# Patient Record
Sex: Male | Born: 1945 | Race: White | Hispanic: No | Smoking: Former smoker
Health system: Southern US, Community
[De-identification: ages and names within clinical notes are randomized; demographics above are authoritative.]

## PROBLEM LIST (undated history)

## (undated) DIAGNOSIS — I10 Essential (primary) hypertension: Secondary | ICD-10-CM

## (undated) DIAGNOSIS — C801 Malignant (primary) neoplasm, unspecified: Secondary | ICD-10-CM

## (undated) DIAGNOSIS — C4490 Unspecified malignant neoplasm of skin, unspecified: Secondary | ICD-10-CM

## (undated) DIAGNOSIS — M1990 Unspecified osteoarthritis, unspecified site: Secondary | ICD-10-CM

## (undated) DIAGNOSIS — R7303 Prediabetes: Secondary | ICD-10-CM

## (undated) HISTORY — PX: SKIN SURGERY: SHX2413

## (undated) HISTORY — DX: Unspecified malignant neoplasm of skin, unspecified: C44.90

## (undated) HISTORY — PX: HERNIA REPAIR: SHX51

## (undated) HISTORY — PX: LAPAROSCOPIC PARTIAL NEPHRECTOMY: SUR782

## (undated) HISTORY — PX: COLONOSCOPY: SHX174

---

## 2007-05-07 HISTORY — PX: PARTIAL NEPHRECTOMY: SHX414

## 2017-10-09 DIAGNOSIS — Z85828 Personal history of other malignant neoplasm of skin: Secondary | ICD-10-CM | POA: Insufficient documentation

## 2017-10-09 DIAGNOSIS — F419 Anxiety disorder, unspecified: Secondary | ICD-10-CM | POA: Insufficient documentation

## 2017-10-30 DIAGNOSIS — I77819 Aortic ectasia, unspecified site: Secondary | ICD-10-CM | POA: Insufficient documentation

## 2018-03-11 DIAGNOSIS — Z87891 Personal history of nicotine dependence: Secondary | ICD-10-CM | POA: Diagnosis not present

## 2018-03-11 DIAGNOSIS — M25512 Pain in left shoulder: Secondary | ICD-10-CM | POA: Diagnosis not present

## 2018-03-11 DIAGNOSIS — Z299 Encounter for prophylactic measures, unspecified: Secondary | ICD-10-CM | POA: Diagnosis not present

## 2018-03-11 DIAGNOSIS — E7800 Pure hypercholesterolemia, unspecified: Secondary | ICD-10-CM | POA: Diagnosis not present

## 2018-03-11 DIAGNOSIS — M25511 Pain in right shoulder: Secondary | ICD-10-CM | POA: Diagnosis not present

## 2018-03-11 DIAGNOSIS — Z6825 Body mass index (BMI) 25.0-25.9, adult: Secondary | ICD-10-CM | POA: Diagnosis not present

## 2018-03-11 DIAGNOSIS — R21 Rash and other nonspecific skin eruption: Secondary | ICD-10-CM | POA: Diagnosis not present

## 2018-04-03 DIAGNOSIS — M25511 Pain in right shoulder: Secondary | ICD-10-CM | POA: Diagnosis not present

## 2018-04-03 DIAGNOSIS — Z0000 Encounter for general adult medical examination without abnormal findings: Secondary | ICD-10-CM | POA: Diagnosis not present

## 2018-04-03 DIAGNOSIS — R5383 Other fatigue: Secondary | ICD-10-CM | POA: Diagnosis not present

## 2018-04-03 DIAGNOSIS — Z1331 Encounter for screening for depression: Secondary | ICD-10-CM | POA: Diagnosis not present

## 2018-04-03 DIAGNOSIS — E7800 Pure hypercholesterolemia, unspecified: Secondary | ICD-10-CM | POA: Diagnosis not present

## 2018-04-03 DIAGNOSIS — Z1339 Encounter for screening examination for other mental health and behavioral disorders: Secondary | ICD-10-CM | POA: Diagnosis not present

## 2018-04-03 DIAGNOSIS — Z1211 Encounter for screening for malignant neoplasm of colon: Secondary | ICD-10-CM | POA: Diagnosis not present

## 2018-04-03 DIAGNOSIS — Z79899 Other long term (current) drug therapy: Secondary | ICD-10-CM | POA: Diagnosis not present

## 2018-04-03 DIAGNOSIS — M25512 Pain in left shoulder: Secondary | ICD-10-CM | POA: Diagnosis not present

## 2018-04-03 DIAGNOSIS — Z7189 Other specified counseling: Secondary | ICD-10-CM | POA: Diagnosis not present

## 2018-04-03 DIAGNOSIS — Z125 Encounter for screening for malignant neoplasm of prostate: Secondary | ICD-10-CM | POA: Diagnosis not present

## 2018-05-12 DIAGNOSIS — Z299 Encounter for prophylactic measures, unspecified: Secondary | ICD-10-CM | POA: Diagnosis not present

## 2018-05-12 DIAGNOSIS — I10 Essential (primary) hypertension: Secondary | ICD-10-CM | POA: Diagnosis not present

## 2018-05-12 DIAGNOSIS — M19029 Primary osteoarthritis, unspecified elbow: Secondary | ICD-10-CM | POA: Diagnosis not present

## 2018-05-12 DIAGNOSIS — M19021 Primary osteoarthritis, right elbow: Secondary | ICD-10-CM | POA: Diagnosis not present

## 2018-05-12 DIAGNOSIS — Z87891 Personal history of nicotine dependence: Secondary | ICD-10-CM | POA: Diagnosis not present

## 2018-05-12 DIAGNOSIS — Z6825 Body mass index (BMI) 25.0-25.9, adult: Secondary | ICD-10-CM | POA: Diagnosis not present

## 2018-06-12 DIAGNOSIS — M25512 Pain in left shoulder: Secondary | ICD-10-CM | POA: Diagnosis not present

## 2018-06-12 DIAGNOSIS — I10 Essential (primary) hypertension: Secondary | ICD-10-CM | POA: Diagnosis not present

## 2018-06-12 DIAGNOSIS — Z299 Encounter for prophylactic measures, unspecified: Secondary | ICD-10-CM | POA: Diagnosis not present

## 2018-06-12 DIAGNOSIS — M25511 Pain in right shoulder: Secondary | ICD-10-CM | POA: Diagnosis not present

## 2018-06-12 DIAGNOSIS — Z6825 Body mass index (BMI) 25.0-25.9, adult: Secondary | ICD-10-CM | POA: Diagnosis not present

## 2018-06-12 DIAGNOSIS — M1990 Unspecified osteoarthritis, unspecified site: Secondary | ICD-10-CM | POA: Diagnosis not present

## 2018-08-03 DIAGNOSIS — M25511 Pain in right shoulder: Secondary | ICD-10-CM | POA: Diagnosis not present

## 2018-08-03 DIAGNOSIS — M1990 Unspecified osteoarthritis, unspecified site: Secondary | ICD-10-CM | POA: Diagnosis not present

## 2018-08-03 DIAGNOSIS — Z6825 Body mass index (BMI) 25.0-25.9, adult: Secondary | ICD-10-CM | POA: Diagnosis not present

## 2018-08-03 DIAGNOSIS — M25512 Pain in left shoulder: Secondary | ICD-10-CM | POA: Diagnosis not present

## 2018-08-03 DIAGNOSIS — Z299 Encounter for prophylactic measures, unspecified: Secondary | ICD-10-CM | POA: Diagnosis not present

## 2018-08-03 DIAGNOSIS — I10 Essential (primary) hypertension: Secondary | ICD-10-CM | POA: Diagnosis not present

## 2018-09-08 DIAGNOSIS — Z6824 Body mass index (BMI) 24.0-24.9, adult: Secondary | ICD-10-CM | POA: Diagnosis not present

## 2018-09-08 DIAGNOSIS — M2550 Pain in unspecified joint: Secondary | ICD-10-CM | POA: Diagnosis not present

## 2018-10-05 DIAGNOSIS — E7800 Pure hypercholesterolemia, unspecified: Secondary | ICD-10-CM | POA: Diagnosis not present

## 2018-10-05 DIAGNOSIS — I10 Essential (primary) hypertension: Secondary | ICD-10-CM | POA: Diagnosis not present

## 2018-10-05 DIAGNOSIS — N400 Enlarged prostate without lower urinary tract symptoms: Secondary | ICD-10-CM | POA: Diagnosis not present

## 2018-10-05 DIAGNOSIS — Z6824 Body mass index (BMI) 24.0-24.9, adult: Secondary | ICD-10-CM | POA: Diagnosis not present

## 2018-10-05 DIAGNOSIS — M1990 Unspecified osteoarthritis, unspecified site: Secondary | ICD-10-CM | POA: Diagnosis not present

## 2018-10-05 DIAGNOSIS — Z299 Encounter for prophylactic measures, unspecified: Secondary | ICD-10-CM | POA: Diagnosis not present

## 2019-01-05 DIAGNOSIS — Z6824 Body mass index (BMI) 24.0-24.9, adult: Secondary | ICD-10-CM | POA: Diagnosis not present

## 2019-01-05 DIAGNOSIS — M25512 Pain in left shoulder: Secondary | ICD-10-CM | POA: Diagnosis not present

## 2019-01-05 DIAGNOSIS — N400 Enlarged prostate without lower urinary tract symptoms: Secondary | ICD-10-CM | POA: Diagnosis not present

## 2019-01-05 DIAGNOSIS — M1990 Unspecified osteoarthritis, unspecified site: Secondary | ICD-10-CM | POA: Diagnosis not present

## 2019-01-05 DIAGNOSIS — I10 Essential (primary) hypertension: Secondary | ICD-10-CM | POA: Diagnosis not present

## 2019-01-05 DIAGNOSIS — Z299 Encounter for prophylactic measures, unspecified: Secondary | ICD-10-CM | POA: Diagnosis not present

## 2019-02-02 DIAGNOSIS — I10 Essential (primary) hypertension: Secondary | ICD-10-CM | POA: Diagnosis not present

## 2019-02-05 DIAGNOSIS — Z713 Dietary counseling and surveillance: Secondary | ICD-10-CM | POA: Diagnosis not present

## 2019-02-05 DIAGNOSIS — I10 Essential (primary) hypertension: Secondary | ICD-10-CM | POA: Diagnosis not present

## 2019-02-05 DIAGNOSIS — Z23 Encounter for immunization: Secondary | ICD-10-CM | POA: Diagnosis not present

## 2019-02-05 DIAGNOSIS — Z299 Encounter for prophylactic measures, unspecified: Secondary | ICD-10-CM | POA: Diagnosis not present

## 2019-02-23 DIAGNOSIS — Z713 Dietary counseling and surveillance: Secondary | ICD-10-CM | POA: Diagnosis not present

## 2019-02-23 DIAGNOSIS — Z299 Encounter for prophylactic measures, unspecified: Secondary | ICD-10-CM | POA: Diagnosis not present

## 2019-02-23 DIAGNOSIS — I10 Essential (primary) hypertension: Secondary | ICD-10-CM | POA: Diagnosis not present

## 2019-02-23 DIAGNOSIS — Z6825 Body mass index (BMI) 25.0-25.9, adult: Secondary | ICD-10-CM | POA: Diagnosis not present

## 2019-03-03 DIAGNOSIS — I10 Essential (primary) hypertension: Secondary | ICD-10-CM | POA: Diagnosis not present

## 2019-03-11 DIAGNOSIS — M1990 Unspecified osteoarthritis, unspecified site: Secondary | ICD-10-CM | POA: Diagnosis not present

## 2019-03-11 DIAGNOSIS — Z6824 Body mass index (BMI) 24.0-24.9, adult: Secondary | ICD-10-CM | POA: Diagnosis not present

## 2019-03-11 DIAGNOSIS — M25511 Pain in right shoulder: Secondary | ICD-10-CM | POA: Diagnosis not present

## 2019-03-11 DIAGNOSIS — Z299 Encounter for prophylactic measures, unspecified: Secondary | ICD-10-CM | POA: Diagnosis not present

## 2019-03-11 DIAGNOSIS — I10 Essential (primary) hypertension: Secondary | ICD-10-CM | POA: Diagnosis not present

## 2019-03-11 DIAGNOSIS — M25512 Pain in left shoulder: Secondary | ICD-10-CM | POA: Diagnosis not present

## 2019-04-05 DIAGNOSIS — I10 Essential (primary) hypertension: Secondary | ICD-10-CM | POA: Diagnosis not present

## 2019-04-08 DIAGNOSIS — R5383 Other fatigue: Secondary | ICD-10-CM | POA: Diagnosis not present

## 2019-04-08 DIAGNOSIS — Z1339 Encounter for screening examination for other mental health and behavioral disorders: Secondary | ICD-10-CM | POA: Diagnosis not present

## 2019-04-08 DIAGNOSIS — Z7189 Other specified counseling: Secondary | ICD-10-CM | POA: Diagnosis not present

## 2019-04-08 DIAGNOSIS — E7800 Pure hypercholesterolemia, unspecified: Secondary | ICD-10-CM | POA: Diagnosis not present

## 2019-04-08 DIAGNOSIS — M25519 Pain in unspecified shoulder: Secondary | ICD-10-CM | POA: Diagnosis not present

## 2019-04-08 DIAGNOSIS — Z125 Encounter for screening for malignant neoplasm of prostate: Secondary | ICD-10-CM | POA: Diagnosis not present

## 2019-04-08 DIAGNOSIS — Z1211 Encounter for screening for malignant neoplasm of colon: Secondary | ICD-10-CM | POA: Diagnosis not present

## 2019-04-08 DIAGNOSIS — Z1331 Encounter for screening for depression: Secondary | ICD-10-CM | POA: Diagnosis not present

## 2019-04-08 DIAGNOSIS — Z79899 Other long term (current) drug therapy: Secondary | ICD-10-CM | POA: Diagnosis not present

## 2019-04-08 DIAGNOSIS — Z0000 Encounter for general adult medical examination without abnormal findings: Secondary | ICD-10-CM | POA: Diagnosis not present

## 2019-04-21 DIAGNOSIS — M25511 Pain in right shoulder: Secondary | ICD-10-CM | POA: Insufficient documentation

## 2019-04-21 DIAGNOSIS — M5030 Other cervical disc degeneration, unspecified cervical region: Secondary | ICD-10-CM | POA: Diagnosis not present

## 2019-04-21 DIAGNOSIS — M25512 Pain in left shoulder: Secondary | ICD-10-CM | POA: Diagnosis not present

## 2019-04-26 DIAGNOSIS — I10 Essential (primary) hypertension: Secondary | ICD-10-CM | POA: Diagnosis not present

## 2019-05-01 DIAGNOSIS — M25511 Pain in right shoulder: Secondary | ICD-10-CM | POA: Diagnosis not present

## 2019-05-13 DIAGNOSIS — M25511 Pain in right shoulder: Secondary | ICD-10-CM | POA: Diagnosis not present

## 2019-05-13 DIAGNOSIS — M75121 Complete rotator cuff tear or rupture of right shoulder, not specified as traumatic: Secondary | ICD-10-CM | POA: Diagnosis not present

## 2019-06-01 DIAGNOSIS — I10 Essential (primary) hypertension: Secondary | ICD-10-CM | POA: Diagnosis not present

## 2019-06-06 DIAGNOSIS — M1990 Unspecified osteoarthritis, unspecified site: Secondary | ICD-10-CM | POA: Diagnosis not present

## 2019-06-06 DIAGNOSIS — E7800 Pure hypercholesterolemia, unspecified: Secondary | ICD-10-CM | POA: Diagnosis not present

## 2019-06-10 DIAGNOSIS — Z87891 Personal history of nicotine dependence: Secondary | ICD-10-CM | POA: Diagnosis not present

## 2019-06-10 DIAGNOSIS — Z6824 Body mass index (BMI) 24.0-24.9, adult: Secondary | ICD-10-CM | POA: Diagnosis not present

## 2019-06-10 DIAGNOSIS — Z299 Encounter for prophylactic measures, unspecified: Secondary | ICD-10-CM | POA: Diagnosis not present

## 2019-06-10 DIAGNOSIS — I10 Essential (primary) hypertension: Secondary | ICD-10-CM | POA: Diagnosis not present

## 2019-06-10 DIAGNOSIS — M75101 Unspecified rotator cuff tear or rupture of right shoulder, not specified as traumatic: Secondary | ICD-10-CM | POA: Diagnosis not present

## 2019-06-10 DIAGNOSIS — M25519 Pain in unspecified shoulder: Secondary | ICD-10-CM | POA: Diagnosis not present

## 2019-06-23 DIAGNOSIS — M25511 Pain in right shoulder: Secondary | ICD-10-CM | POA: Diagnosis not present

## 2019-07-04 DIAGNOSIS — I10 Essential (primary) hypertension: Secondary | ICD-10-CM | POA: Diagnosis not present

## 2019-07-05 DIAGNOSIS — E7800 Pure hypercholesterolemia, unspecified: Secondary | ICD-10-CM | POA: Insufficient documentation

## 2019-07-05 DIAGNOSIS — M5412 Radiculopathy, cervical region: Secondary | ICD-10-CM | POA: Diagnosis not present

## 2019-07-05 DIAGNOSIS — M542 Cervicalgia: Secondary | ICD-10-CM | POA: Diagnosis not present

## 2019-07-05 DIAGNOSIS — Z6824 Body mass index (BMI) 24.0-24.9, adult: Secondary | ICD-10-CM | POA: Diagnosis not present

## 2019-07-05 DIAGNOSIS — Z299 Encounter for prophylactic measures, unspecified: Secondary | ICD-10-CM | POA: Diagnosis not present

## 2019-07-05 DIAGNOSIS — M5030 Other cervical disc degeneration, unspecified cervical region: Secondary | ICD-10-CM | POA: Insufficient documentation

## 2019-07-05 DIAGNOSIS — I10 Essential (primary) hypertension: Secondary | ICD-10-CM | POA: Insufficient documentation

## 2019-07-05 DIAGNOSIS — M961 Postlaminectomy syndrome, not elsewhere classified: Secondary | ICD-10-CM | POA: Insufficient documentation

## 2019-07-05 DIAGNOSIS — M75101 Unspecified rotator cuff tear or rupture of right shoulder, not specified as traumatic: Secondary | ICD-10-CM | POA: Diagnosis not present

## 2019-07-12 DIAGNOSIS — M5412 Radiculopathy, cervical region: Secondary | ICD-10-CM | POA: Diagnosis not present

## 2019-07-16 DIAGNOSIS — M961 Postlaminectomy syndrome, not elsewhere classified: Secondary | ICD-10-CM | POA: Diagnosis not present

## 2019-07-16 DIAGNOSIS — M5030 Other cervical disc degeneration, unspecified cervical region: Secondary | ICD-10-CM | POA: Diagnosis not present

## 2019-07-28 DIAGNOSIS — M25511 Pain in right shoulder: Secondary | ICD-10-CM | POA: Diagnosis not present

## 2019-07-28 DIAGNOSIS — M19111 Post-traumatic osteoarthritis, right shoulder: Secondary | ICD-10-CM | POA: Diagnosis not present

## 2019-08-03 DIAGNOSIS — I10 Essential (primary) hypertension: Secondary | ICD-10-CM | POA: Diagnosis not present

## 2019-09-02 NOTE — Progress Notes (Signed)
Please place surgery orders. Pt is scheduled for his PAT visit tomorrow.

## 2019-09-02 NOTE — Progress Notes (Addendum)
PCP - [name], MD Cardiologist -   Chest x-ray -  EKG -  Stress Test -  ECHO -  Cardiac Cath -   Sleep Study -  CPAP -   Fasting Blood Sugar -  Checks Blood Sugar _____ times a day  Blood Thinner Instructions: Aspirin Instructions: Last Dose:  Anesthesia review:   Patient denies shortness of breath, fever, cough and chest pain at PAT appointment   Patient verbalized understanding of instructions that were given to them at the PAT appointment. Patient was also instructed that they will need to review over the PAT instructions again at home before surgery.

## 2019-09-02 NOTE — Patient Instructions (Addendum)
DUE TO COVID-19 ONLY ONE VISITOR IS ALLOWED TO COME WITH YOU AND STAY IN THE WAITING ROOM ONLY DURING PRE OP AND PROCEDURE DAY OF SURGERY. THE 1 VISITOR MAY VISIT WITH YOU AFTER SURGERY IN YOUR PRIVATE ROOM DURING VISITING HOURS ONLY!  YOU NEED TO HAVE A COVID 19 TEST ON 09-07-19 @ 2:15 PM, THIS TEST MUST BE DONE BEFORE SURGERY, COME  TO [place], [place] TEST IS COMPLETED, PLEASE BEGIN THE QUARANTINE INSTRUCTIONS AS OUTLINED IN YOUR HANDOUT.                DARROW EGELER  09/02/2019   Your procedure is scheduled on: 09-10-19   Report to [facility] Main  Entrance    Report to Admitting at 7:35 AM     Call this number if you have problems the morning of surgery [phone]    Remember: Do not eat food or drink liquids :After Midnight.     Take these medicines the morning of surgery with A SIP OF WATER: Amlodipine (Norvasc)  BRUSH YOUR TEETH MORNING OF SURGERY AND RINSE YOUR MOUTH OUT, NO CHEWING GUM CANDY OR MINTS.                                 You may not have any metal on your body including hair pins and              piercings     Do not wear jewelry, cologne, lotions, powders or deodorant              Men may shave face and neck.   Do not bring valuables to the hospital. [place].  Contacts, dentures or bridgework may not be worn into surgery.  You may bring an overnight bag     Special Instructions: N/A              Please read over the following fact sheets you were given: _____________________________________________________________________             [facility]- Preparing for Total Shoulder Arthroplasty    Before surgery, you can play an important role. Because skin is not sterile, your skin needs to be as free of germs as possible. You can reduce the number of germs on your skin by using the following products. . Benzoyl Peroxide Gel o Reduces the number of germs  present on the skin o Applied twice a day to shoulder area starting two days before surgery    ==================================================================  Please follow these instructions carefully:  BENZOYL PEROXIDE 5% GEL  Please do not use if you have an allergy to benzoyl peroxide.   If your skin becomes reddened/irritated stop using the benzoyl peroxide.  Starting two days before surgery, apply as follows: 1. Apply benzoyl peroxide in the morning and at night. Apply after taking a shower. If you are not taking a shower clean entire shoulder front, back, and side along with the armpit with a clean wet washcloth.  2. Place a quarter-sized dollop on your shoulder and rub in thoroughly, making sure to cover the front, back, and side of your shoulder, along with the armpit.   2 days before ____ AM   ____ PM  1 day before ____ AM   ____ PM                         3. Do this twice a day for two days.  (Last application is the night before surgery, AFTER using the CHG soap as described below).  4. Do NOT apply benzoyl peroxide gel on the day of surgery. [facility] - Preparing for Surgery Before surgery, you can play an important role.  Because skin is not sterile, your skin needs to be as free of germs as possible.  You can reduce the number of germs on your skin by washing with CHG (chlorahexidine gluconate) soap before surgery.  CHG is an antiseptic cleaner which kills germs and bonds with the skin to continue killing germs even after washing. Please DO NOT use if you have an allergy to CHG or antibacterial soaps.  If your skin becomes reddened/irritated stop using the CHG and inform your nurse when you arrive at Short Stay. Do not shave (including legs and underarms) for at least 48 hours prior to the first CHG shower.  You may shave your face/neck. Please follow these instructions carefully:  1.  Shower with CHG Soap the night before surgery and the  morning of  Surgery.  2.  If you choose to wash your hair, wash your hair first as usual with your  normal  shampoo.  3.  After you shampoo, rinse your hair and body thoroughly to remove the  shampoo.                           4.  Use CHG as you would any other liquid soap.  You can apply chg directly  to the skin and wash                       Gently with a scrungie or clean washcloth.  5.  Apply the CHG Soap to your body ONLY FROM THE NECK DOWN.   Do not use on face/ open                           Wound or open sores. Avoid contact with eyes, ears mouth and genitals (private parts).                       Wash face,  Genitals (private parts) with your normal soap.             6.  Wash thoroughly, paying special attention to the area where your surgery  will be performed.  7.  Thoroughly rinse your body with warm water from the neck down.  8.  DO NOT shower/wash with your normal soap after using and rinsing off  the CHG Soap.                9.  Pat yourself dry with a clean towel.            10.  Wear clean pajamas.            11.  Place clean sheets on your bed the night of your first shower and do not  sleep with pets. Day of Surgery : Do not apply any lotions/deodorants the morning of surgery.  Please wear clean clothes to the hospital/surgery center.  FAILURE TO FOLLOW THESE INSTRUCTIONS MAY RESULT  IN THE CANCELLATION OF YOUR SURGERY PATIENT SIGNATURE_________________________________  NURSE SIGNATURE__________________________________  ________________________________________________________________________

## 2019-09-03 ENCOUNTER — Encounter (HOSPITAL_COMMUNITY)
Admission: RE | Admit: 2019-09-03 | Discharge: 2019-09-03 | Disposition: A | Discharge status: Home / Self Care | Payer: Medicare HMO | Source: Ambulatory Visit | Attending: Orthopedic Surgery | Admitting: Orthopedic Surgery

## 2019-09-03 ENCOUNTER — Encounter (HOSPITAL_COMMUNITY): Payer: Self-pay

## 2019-09-03 ENCOUNTER — Other Ambulatory Visit: Payer: Self-pay

## 2019-09-03 DIAGNOSIS — R001 Bradycardia, unspecified: Secondary | ICD-10-CM | POA: Insufficient documentation

## 2019-09-03 DIAGNOSIS — I10 Essential (primary) hypertension: Secondary | ICD-10-CM | POA: Insufficient documentation

## 2019-09-03 DIAGNOSIS — Z01818 Encounter for other preprocedural examination: Secondary | ICD-10-CM | POA: Insufficient documentation

## 2019-09-03 HISTORY — DX: Essential (primary) hypertension: I10

## 2019-09-03 HISTORY — DX: Prediabetes: R73.03

## 2019-09-03 HISTORY — DX: Malignant (primary) neoplasm, unspecified: C80.1

## 2019-09-03 HISTORY — DX: Unspecified osteoarthritis, unspecified site: M19.90

## 2019-09-03 LAB — BASIC METABOLIC PANEL
Anion gap: 7 (ref 5–15)
BUN: 26 mg/dL — ABNORMAL HIGH (ref 8–23)
CO2: 26 mmol/L (ref 22–32)
Calcium: 9.2 mg/dL (ref 8.9–10.3)
Chloride: 106 mmol/L (ref 98–111)
Creatinine, Ser: 1.16 mg/dL (ref 0.61–1.24)
GFR calc Af Amer: >60 mL/min (ref >60)
GFR calc non Af Amer: >60 mL/min (ref >60)
Glucose, Bld: 124 mg/dL — ABNORMAL HIGH (ref 70–99)
Potassium: 4.3 mmol/L (ref 3.5–5.1)
Sodium: 139 mmol/L (ref 135–145)

## 2019-09-03 LAB — HEMOGLOBIN A1C
Hgb A1c MFr Bld: 5.6 % (ref 4.8–5.6)
Mean Plasma Glucose: 114.02 mg/dL

## 2019-09-03 LAB — CBC
HCT: 42.9 % (ref 39.0–52.0)
Hemoglobin: 14.1 g/dL (ref 13.0–17.0)
MCH: 32.5 pg (ref 26.0–34.0)
MCHC: 32.9 g/dL (ref 30.0–36.0)
MCV: 98.8 fL (ref 80.0–100.0)
Platelets: 246 10*3/uL (ref 150–400)
RBC: 4.34 MIL/uL (ref 4.22–5.81)
RDW: 12.5 % (ref 11.5–15.5)
WBC: 8.2 10*3/uL (ref 4.0–10.5)
nRBC: 0 % (ref 0.0–0.2)

## 2019-09-03 LAB — SURGICAL PCR SCREEN
MRSA, PCR: NEGATIVE
Staphylococcus aureus: POSITIVE — AB

## 2019-09-03 NOTE — Progress Notes (Addendum)
PCR result route to Dr. Veverly Fells for review

## 2019-09-07 ENCOUNTER — Other Ambulatory Visit (HOSPITAL_COMMUNITY)
Admission: RE | Admit: 2019-09-07 | Discharge: 2019-09-07 | Disposition: A | Discharge status: Home / Self Care | Payer: Medicare HMO | Source: Ambulatory Visit | Attending: Orthopedic Surgery | Admitting: Orthopedic Surgery

## 2019-09-07 ENCOUNTER — Other Ambulatory Visit: Payer: Self-pay

## 2019-09-07 DIAGNOSIS — Z87891 Personal history of nicotine dependence: Secondary | ICD-10-CM | POA: Diagnosis not present

## 2019-09-07 DIAGNOSIS — Z96611 Presence of right artificial shoulder joint: Secondary | ICD-10-CM | POA: Diagnosis not present

## 2019-09-07 DIAGNOSIS — Z471 Aftercare following joint replacement surgery: Secondary | ICD-10-CM | POA: Diagnosis not present

## 2019-09-07 DIAGNOSIS — G8918 Other acute postprocedural pain: Secondary | ICD-10-CM | POA: Diagnosis not present

## 2019-09-07 DIAGNOSIS — Z85828 Personal history of other malignant neoplasm of skin: Secondary | ICD-10-CM | POA: Diagnosis not present

## 2019-09-07 DIAGNOSIS — Z79899 Other long term (current) drug therapy: Secondary | ICD-10-CM | POA: Diagnosis not present

## 2019-09-07 DIAGNOSIS — M67813 Other specified disorders of tendon, right shoulder: Secondary | ICD-10-CM | POA: Diagnosis not present

## 2019-09-07 DIAGNOSIS — Z20822 Contact with and (suspected) exposure to covid-19: Secondary | ICD-10-CM | POA: Diagnosis present

## 2019-09-07 DIAGNOSIS — I10 Essential (primary) hypertension: Secondary | ICD-10-CM | POA: Diagnosis present

## 2019-09-07 DIAGNOSIS — Z85528 Personal history of other malignant neoplasm of kidney: Secondary | ICD-10-CM | POA: Diagnosis not present

## 2019-09-07 DIAGNOSIS — M75101 Unspecified rotator cuff tear or rupture of right shoulder, not specified as traumatic: Secondary | ICD-10-CM | POA: Diagnosis present

## 2019-09-07 DIAGNOSIS — M19011 Primary osteoarthritis, right shoulder: Secondary | ICD-10-CM | POA: Diagnosis present

## 2019-09-07 DIAGNOSIS — Z905 Acquired absence of kidney: Secondary | ICD-10-CM | POA: Diagnosis not present

## 2019-09-07 DIAGNOSIS — M12811 Other specific arthropathies, not elsewhere classified, right shoulder: Secondary | ICD-10-CM | POA: Diagnosis not present

## 2019-09-07 NOTE — H&P (Signed)
Patient's anticipated LOS is less than 2 midnights, meeting these requirements: - Younger than 82 - Lives within 1 hour of care - Has a competent adult at home to recover with post-op recover - NO history of  - Chronic pain requiring opiods  - Diabetes  - Coronary Artery Disease  - Heart failure  - Heart attack  - Stroke  - DVT/VTE  - Cardiac arrhythmia  - Respiratory Failure/COPD  - Renal failure  - Anemia  - Advanced Liver disease       Douglas [name] is an 74 y.o. male.    Chief Complaint: right shoulder pain  HPI: Pt is a 74 y.o. male complaining of right shoulder  pain for multiple years. Pain had continually increased since the beginning. X-rays in the clinic show end-stage arthritic changes of the right shoulder. Pt has tried various conservative treatments which have failed to alleviate their symptoms, including injections and therapy. Various options are discussed with the patient. Risks, benefits and expectations were discussed with the patient. Patient understand the risks, benefits and expectations and wishes to proceed with surgery.   PCP:  [name], MD  D/C Plans: Home  PMH: Past Medical History:  Diagnosis Date  . Arthritis   . Cancer (HCC)    Skin, and Kidney  . Hypertension   . Pre-diabetes     PSH: Past Surgical History:  Procedure Laterality Date  . COLONOSCOPY    . HERNIA REPAIR    . PARTIAL NEPHRECTOMY Left 2009    Social History:  reports that he quit smoking about 11 years ago. His smoking use included cigarettes. He has a 20.00 pack-year smoking history. He has never used smokeless tobacco. He reports current alcohol use of about 1.0 standard drinks of alcohol per week. He reports that he does not use drugs.  Allergies:  No Known Allergies  Medications: No current facility-administered medications for this encounter.   Current Outpatient Medications  Medication Sig Dispense Refill  . amLODipine (NORVASC) 10 MG tablet Take 10 mg  by mouth daily.    Marland Kitchen atenolol (TENORMIN) 25 MG tablet Take 25 mg by mouth daily.    . cholecalciferol (VITAMIN D3) 25 MCG (1000 UNIT) tablet Take 1,000 Units by mouth daily.    . diclofenac (VOLTAREN) 75 MG EC tablet Take 75 mg by mouth daily.    Marland Kitchen lisinopril (ZESTRIL) 40 MG tablet Take 40 mg by mouth daily.    . Menaquinone-7 (VITAMIN K2 PO) Take 1 tablet by mouth daily.    . Multiple Vitamins-Minerals (MULTIVITAMIN WITH MINERALS) tablet Take 1 tablet by mouth daily.    . simvastatin (ZOCOR) 20 MG tablet Take 20 mg by mouth daily.    . tamsulosin (FLOMAX) 0.4 MG CAPS capsule Take 0.8 mg by mouth daily.      No results found for this or any previous visit (from the past 48 hour(s)). No results found.  ROS: Pain with rom of the right upper extremity  Physical Exam: Alert and oriented 74 y.o. male in no acute distress Cranial nerves 2-12 intact Cervical spine: full rom with no tenderness, nv intact distally Chest: active breath sounds bilaterally, no wheeze rhonchi or rales Heart: regular rate and rhythm, no murmur Abd: non tender non distended with active bowel sounds Hip is stable with rom  Right shoulder painful rom nv intact distally No rashes or edema  Assessment/Plan Assessment: right shoulder pain  Plan:  Patient will undergo a revere total shoulder by Dr. Veverly Fells at  [facility] Risks benefits and expectations were discussed with the patient. Patient understand risks, benefits and expectations and wishes to proceed. Preoperative templating of the joint replacement has been completed, documented, and submitted to the Operating Room personnel in order to optimize intra-operative equipment management.   Merla Riches PA-C, [facility] is now [employer] [address], [place], [place], [place] [address] Phone: [phone] [email or web address] Facebook  [employer]

## 2019-09-08 LAB — SARS CORONAVIRUS 2 (TAT 6-24 HRS): SARS Coronavirus 2: NEGATIVE

## 2019-09-10 ENCOUNTER — Encounter (HOSPITAL_COMMUNITY)
Admission: RE | Disposition: A | Discharge status: Home / Self Care | Payer: Self-pay | Source: Ambulatory Visit | Attending: Orthopedic Surgery

## 2019-09-10 ENCOUNTER — Inpatient Hospital Stay (HOSPITAL_COMMUNITY)
Admission: RE | Admit: 2019-09-10 | Discharge: 2019-09-11 | DRG: 483 | Disposition: A | Discharge status: Home / Self Care | Payer: Medicare HMO | Attending: Orthopedic Surgery | Admitting: Orthopedic Surgery

## 2019-09-10 ENCOUNTER — Inpatient Hospital Stay (HOSPITAL_COMMUNITY): Payer: Medicare HMO

## 2019-09-10 ENCOUNTER — Other Ambulatory Visit: Payer: Self-pay

## 2019-09-10 ENCOUNTER — Inpatient Hospital Stay (HOSPITAL_COMMUNITY): Payer: Medicare HMO | Admitting: Anesthesiology

## 2019-09-10 ENCOUNTER — Encounter (HOSPITAL_COMMUNITY): Payer: Self-pay | Admitting: Orthopedic Surgery

## 2019-09-10 DIAGNOSIS — M75101 Unspecified rotator cuff tear or rupture of right shoulder, not specified as traumatic: Secondary | ICD-10-CM | POA: Diagnosis present

## 2019-09-10 DIAGNOSIS — Z87891 Personal history of nicotine dependence: Secondary | ICD-10-CM | POA: Diagnosis not present

## 2019-09-10 DIAGNOSIS — I10 Essential (primary) hypertension: Secondary | ICD-10-CM | POA: Diagnosis present

## 2019-09-10 DIAGNOSIS — Z905 Acquired absence of kidney: Secondary | ICD-10-CM | POA: Diagnosis not present

## 2019-09-10 DIAGNOSIS — Z85828 Personal history of other malignant neoplasm of skin: Secondary | ICD-10-CM

## 2019-09-10 DIAGNOSIS — Z85528 Personal history of other malignant neoplasm of kidney: Secondary | ICD-10-CM

## 2019-09-10 DIAGNOSIS — Z20822 Contact with and (suspected) exposure to covid-19: Secondary | ICD-10-CM | POA: Diagnosis present

## 2019-09-10 DIAGNOSIS — M19011 Primary osteoarthritis, right shoulder: Secondary | ICD-10-CM | POA: Diagnosis present

## 2019-09-10 DIAGNOSIS — Z79899 Other long term (current) drug therapy: Secondary | ICD-10-CM | POA: Diagnosis not present

## 2019-09-10 DIAGNOSIS — Z9660 Presence of unspecified orthopedic joint implant: Secondary | ICD-10-CM

## 2019-09-10 DIAGNOSIS — Z96611 Presence of right artificial shoulder joint: Secondary | ICD-10-CM

## 2019-09-10 HISTORY — PX: REVERSE SHOULDER ARTHROPLASTY: SHX5054

## 2019-09-10 SURGERY — ARTHROPLASTY, SHOULDER, TOTAL, REVERSE
Anesthesia: General | Site: Shoulder | Laterality: Right

## 2019-09-10 MED ORDER — MEPERIDINE HCL 50 MG/ML IJ SOLN: 6.2500 mg | INTRAMUSCULAR | Status: DC | PRN

## 2019-09-10 MED ORDER — OXYCODONE HCL 5 MG/5ML PO SOLN: 5.0000 mg | Freq: Once | ORAL | Status: DC | PRN

## 2019-09-10 MED ORDER — ACETAMINOPHEN 160 MG/5ML PO SOLN: 325.0000 mg | ORAL | Status: DC | PRN

## 2019-09-10 MED ORDER — DEXAMETHASONE SODIUM PHOSPHATE 10 MG/ML IJ SOLN
INTRAMUSCULAR | Status: AC
  Filled 2019-09-10: qty 1

## 2019-09-10 MED ORDER — ONDANSETRON HCL 4 MG/2ML IJ SOLN
INTRAMUSCULAR | Status: AC
  Filled 2019-09-10: qty 2

## 2019-09-10 MED ORDER — ROCURONIUM BROMIDE 10 MG/ML (PF) SYRINGE
PREFILLED_SYRINGE | INTRAVENOUS | Status: DC | PRN
  Administered 2019-09-10: 15 mg via INTRAVENOUS

## 2019-09-10 MED ORDER — MIDAZOLAM HCL 2 MG/2ML IJ SOLN
1.0000 mg | INTRAMUSCULAR | Status: DC
  Administered 2019-09-10: 2 mg via INTRAVENOUS
  Filled 2019-09-10: qty 2

## 2019-09-10 MED ORDER — HYDROCODONE-ACETAMINOPHEN 5-325 MG PO TABS
1.0000 | ORAL_TABLET | ORAL | Status: DC | PRN
  Administered 2019-09-11: 1 via ORAL
  Filled 2019-09-10: qty 1

## 2019-09-10 MED ORDER — 0.9 % SODIUM CHLORIDE (POUR BTL) OPTIME
TOPICAL | Status: DC | PRN
  Administered 2019-09-10: 1000 mL

## 2019-09-10 MED ORDER — LIDOCAINE 2% (20 MG/ML) 5 ML SYRINGE
INTRAMUSCULAR | Status: DC | PRN
  Administered 2019-09-10: 50 mg via INTRAVENOUS

## 2019-09-10 MED ORDER — MIDAZOLAM HCL 5 MG/5ML IJ SOLN
INTRAMUSCULAR | Status: DC | PRN
  Administered 2019-09-10: 2 mg via INTRAVENOUS

## 2019-09-10 MED ORDER — BISACODYL 10 MG RE SUPP: 10.0000 mg | Freq: Every day | RECTAL | Status: DC | PRN

## 2019-09-10 MED ORDER — MENTHOL 3 MG MT LOZG: 1.0000 | LOZENGE | OROMUCOSAL | Status: DC | PRN

## 2019-09-10 MED ORDER — SODIUM CHLORIDE 0.9 % IV SOLN: INTRAVENOUS | Status: DC

## 2019-09-10 MED ORDER — MULTI-VITAMIN/MINERALS PO TABS: 1.0000 | ORAL_TABLET | Freq: Every day | ORAL | Status: DC

## 2019-09-10 MED ORDER — ONDANSETRON HCL 4 MG/2ML IJ SOLN: 4.0000 mg | Freq: Four times a day (QID) | INTRAMUSCULAR | Status: DC | PRN

## 2019-09-10 MED ORDER — METOCLOPRAMIDE HCL 5 MG/ML IJ SOLN: 5.0000 mg | Freq: Three times a day (TID) | INTRAMUSCULAR | Status: DC | PRN

## 2019-09-10 MED ORDER — METHOCARBAMOL 500 MG PO TABS: 500.0000 mg | ORAL_TABLET | Freq: Three times a day (TID) | ORAL | 1 refills | Status: DC | PRN

## 2019-09-10 MED ORDER — METOCLOPRAMIDE HCL 5 MG PO TABS: 5.0000 mg | ORAL_TABLET | Freq: Three times a day (TID) | ORAL | Status: DC | PRN

## 2019-09-10 MED ORDER — ONDANSETRON HCL 4 MG/2ML IJ SOLN: 4.0000 mg | Freq: Once | INTRAMUSCULAR | Status: DC | PRN

## 2019-09-10 MED ORDER — LIDOCAINE 2% (20 MG/ML) 5 ML SYRINGE
INTRAMUSCULAR | Status: AC
  Filled 2019-09-10: qty 5

## 2019-09-10 MED ORDER — METHOCARBAMOL 500 MG IVPB - SIMPLE MED
500.0000 mg | Freq: Four times a day (QID) | INTRAVENOUS | Status: DC | PRN
  Filled 2019-09-10: qty 50

## 2019-09-10 MED ORDER — FENTANYL CITRATE (PF) 250 MCG/5ML IJ SOLN
INTRAMUSCULAR | Status: AC
  Filled 2019-09-10: qty 5

## 2019-09-10 MED ORDER — BUPIVACAINE-EPINEPHRINE 0.5% -1:200000 IJ SOLN
INTRAMUSCULAR | Status: AC
  Filled 2019-09-10: qty 1

## 2019-09-10 MED ORDER — LACTATED RINGERS IV SOLN: INTRAVENOUS | Status: DC

## 2019-09-10 MED ORDER — SIMVASTATIN 20 MG PO TABS
20.0000 mg | ORAL_TABLET | Freq: Every day | ORAL | Status: DC
  Administered 2019-09-10: 20 mg via ORAL
  Filled 2019-09-10 (×2): qty 1

## 2019-09-10 MED ORDER — VITAMIN D 25 MCG (1000 UNIT) PO TABS
1000.0000 [IU] | ORAL_TABLET | Freq: Every day | ORAL | Status: DC
  Administered 2019-09-10 – 2019-09-11 (×2): 1000 [IU] via ORAL
  Filled 2019-09-10 (×2): qty 1

## 2019-09-10 MED ORDER — BUPIVACAINE-EPINEPHRINE (PF) 0.5% -1:200000 IJ SOLN
INTRAMUSCULAR | Status: DC | PRN
Start: 2019-09-10 — End: 2019-09-10
  Administered 2019-09-10: 15 mL via PERINEURAL

## 2019-09-10 MED ORDER — ACETAMINOPHEN 325 MG PO TABS: 325.0000 mg | ORAL_TABLET | Freq: Four times a day (QID) | ORAL | Status: DC | PRN

## 2019-09-10 MED ORDER — SUCCINYLCHOLINE CHLORIDE 200 MG/10ML IV SOSY
PREFILLED_SYRINGE | INTRAVENOUS | Status: AC
  Filled 2019-09-10: qty 10

## 2019-09-10 MED ORDER — HYDROCODONE-ACETAMINOPHEN 5-325 MG PO TABS: 1.0000 | ORAL_TABLET | Freq: Four times a day (QID) | ORAL | 0 refills | Status: DC | PRN

## 2019-09-10 MED ORDER — OXYCODONE HCL 5 MG PO TABS: 5.0000 mg | ORAL_TABLET | Freq: Once | ORAL | Status: DC | PRN

## 2019-09-10 MED ORDER — PROPOFOL 10 MG/ML IV BOLUS
INTRAVENOUS | Status: DC | PRN
  Administered 2019-09-10: 150 mg via INTRAVENOUS

## 2019-09-10 MED ORDER — SUCCINYLCHOLINE CHLORIDE 200 MG/10ML IV SOSY
PREFILLED_SYRINGE | INTRAVENOUS | Status: DC | PRN
  Administered 2019-09-10: 140 mg via INTRAVENOUS

## 2019-09-10 MED ORDER — AMLODIPINE BESYLATE 10 MG PO TABS
10.0000 mg | ORAL_TABLET | Freq: Every day | ORAL | Status: DC
  Administered 2019-09-11: 09:00:00 10 mg via ORAL
  Filled 2019-09-10: qty 1

## 2019-09-10 MED ORDER — EPHEDRINE 5 MG/ML INJ
INTRAVENOUS | Status: AC
  Filled 2019-09-10: qty 10

## 2019-09-10 MED ORDER — EPHEDRINE SULFATE 50 MG/ML IJ SOLN
INTRAMUSCULAR | Status: DC | PRN
Start: 2019-09-10 — End: 2019-09-10
  Administered 2019-09-10: 10 mg via INTRAVENOUS

## 2019-09-10 MED ORDER — CEFAZOLIN SODIUM-DEXTROSE 2-4 GM/100ML-% IV SOLN
2.0000 g | Freq: Four times a day (QID) | INTRAVENOUS | Status: DC
  Administered 2019-09-10 – 2019-09-11 (×2): 2 g via INTRAVENOUS
  Filled 2019-09-10 (×3): qty 100

## 2019-09-10 MED ORDER — FENTANYL CITRATE (PF) 100 MCG/2ML IJ SOLN
INTRAMUSCULAR | Status: DC | PRN
  Administered 2019-09-10: 100 ug via INTRAVENOUS

## 2019-09-10 MED ORDER — PHENYLEPHRINE HCL (PRESSORS) 10 MG/ML IV SOLN
INTRAVENOUS | Status: AC
  Filled 2019-09-10: qty 1

## 2019-09-10 MED ORDER — BUPIVACAINE LIPOSOME 1.3 % IJ SUSP
INTRAMUSCULAR | Status: DC | PRN
Start: 2019-09-10 — End: 2019-09-10
  Administered 2019-09-10: 10 mL via PERINEURAL

## 2019-09-10 MED ORDER — ROCURONIUM BROMIDE 10 MG/ML (PF) SYRINGE
PREFILLED_SYRINGE | INTRAVENOUS | Status: AC
  Filled 2019-09-10: qty 10

## 2019-09-10 MED ORDER — CEFAZOLIN SODIUM-DEXTROSE 2-4 GM/100ML-% IV SOLN
2.0000 g | INTRAVENOUS | Status: AC
  Administered 2019-09-10: 2 g via INTRAVENOUS
  Filled 2019-09-10: qty 100

## 2019-09-10 MED ORDER — PHENOL 1.4 % MT LIQD: 1.0000 | OROMUCOSAL | Status: DC | PRN

## 2019-09-10 MED ORDER — STERILE WATER FOR IRRIGATION IR SOLN
Status: DC | PRN
  Administered 2019-09-10: 2000 mL

## 2019-09-10 MED ORDER — POLYETHYLENE GLYCOL 3350 17 G PO PACK: 17.0000 g | PACK | Freq: Every day | ORAL | Status: DC | PRN

## 2019-09-10 MED ORDER — ONDANSETRON HCL 4 MG/2ML IJ SOLN
INTRAMUSCULAR | Status: DC | PRN
  Administered 2019-09-10: 4 mg via INTRAVENOUS

## 2019-09-10 MED ORDER — PHENYLEPHRINE HCL-NACL 10-0.9 MG/250ML-% IV SOLN
INTRAVENOUS | Status: DC | PRN
  Administered 2019-09-10: 150 ug/min via INTRAVENOUS

## 2019-09-10 MED ORDER — DICLOFENAC SODIUM 75 MG PO TBEC
75.0000 mg | DELAYED_RELEASE_TABLET | Freq: Every day | ORAL | Status: DC
  Administered 2019-09-10 – 2019-09-11 (×2): 75 mg via ORAL
  Filled 2019-09-10 (×2): qty 1

## 2019-09-10 MED ORDER — MORPHINE SULFATE (PF) 2 MG/ML IV SOLN: 0.5000 mg | INTRAVENOUS | Status: DC | PRN

## 2019-09-10 MED ORDER — ATENOLOL 25 MG PO TABS
25.0000 mg | ORAL_TABLET | Freq: Every day | ORAL | Status: DC
  Administered 2019-09-10 – 2019-09-11 (×2): 25 mg via ORAL
  Filled 2019-09-10 (×2): qty 1

## 2019-09-10 MED ORDER — PROPOFOL 10 MG/ML IV BOLUS
INTRAVENOUS | Status: AC
  Filled 2019-09-10: qty 20

## 2019-09-10 MED ORDER — ADULT MULTIVITAMIN W/MINERALS CH
1.0000 | ORAL_TABLET | Freq: Every day | ORAL | Status: DC
  Administered 2019-09-10 – 2019-09-11 (×2): 1 via ORAL
  Filled 2019-09-10 (×2): qty 1

## 2019-09-10 MED ORDER — TAMSULOSIN HCL 0.4 MG PO CAPS
0.8000 mg | ORAL_CAPSULE | Freq: Every day | ORAL | Status: DC
  Administered 2019-09-10 – 2019-09-11 (×2): 0.8 mg via ORAL
  Filled 2019-09-10 (×2): qty 2

## 2019-09-10 MED ORDER — DOCUSATE SODIUM 100 MG PO CAPS
100.0000 mg | ORAL_CAPSULE | Freq: Two times a day (BID) | ORAL | Status: DC
  Administered 2019-09-10 – 2019-09-11 (×2): 100 mg via ORAL
  Filled 2019-09-10 (×2): qty 1

## 2019-09-10 MED ORDER — ONDANSETRON HCL 4 MG PO TABS: 4.0000 mg | ORAL_TABLET | Freq: Four times a day (QID) | ORAL | Status: DC | PRN

## 2019-09-10 MED ORDER — SUGAMMADEX SODIUM 200 MG/2ML IV SOLN
INTRAVENOUS | Status: DC | PRN
  Administered 2019-09-10: 150 mg via INTRAVENOUS

## 2019-09-10 MED ORDER — BUPIVACAINE-EPINEPHRINE (PF) 0.5% -1:200000 IJ SOLN
INTRAMUSCULAR | Status: DC | PRN
  Administered 2019-09-10: 7 mL

## 2019-09-10 MED ORDER — METHOCARBAMOL 500 MG PO TABS
500.0000 mg | ORAL_TABLET | Freq: Four times a day (QID) | ORAL | Status: DC | PRN
  Administered 2019-09-10 – 2019-09-11 (×2): 500 mg via ORAL
  Filled 2019-09-10 (×2): qty 1

## 2019-09-10 MED ORDER — MIDAZOLAM HCL 2 MG/2ML IJ SOLN
INTRAMUSCULAR | Status: AC
  Filled 2019-09-10: qty 2

## 2019-09-10 MED ORDER — ACETAMINOPHEN 325 MG PO TABS: 325.0000 mg | ORAL_TABLET | ORAL | Status: DC | PRN

## 2019-09-10 MED ORDER — FENTANYL CITRATE (PF) 100 MCG/2ML IJ SOLN
50.0000 ug | INTRAMUSCULAR | Status: DC
  Administered 2019-09-10: 12:00:00 100 ug via INTRAVENOUS
  Filled 2019-09-10: qty 2

## 2019-09-10 MED ORDER — SODIUM CHLORIDE 0.9 % IV SOLN: INTRAVENOUS | Status: DC | PRN

## 2019-09-10 MED ORDER — LISINOPRIL 20 MG PO TABS
40.0000 mg | ORAL_TABLET | Freq: Every day | ORAL | Status: DC
  Administered 2019-09-10 – 2019-09-11 (×2): 40 mg via ORAL
  Filled 2019-09-10 (×2): qty 2

## 2019-09-10 MED ORDER — DEXAMETHASONE SODIUM PHOSPHATE 10 MG/ML IJ SOLN
INTRAMUSCULAR | Status: DC | PRN
  Administered 2019-09-10: 7 mg via INTRAVENOUS

## 2019-09-10 MED ORDER — ALBUMIN HUMAN 5 % IV SOLN
INTRAVENOUS | Status: AC
  Filled 2019-09-10: qty 250

## 2019-09-10 MED ORDER — ALBUMIN HUMAN 5 % IV SOLN
INTRAVENOUS | Status: DC | PRN
Start: 2019-09-10 — End: 2019-09-10

## 2019-09-10 MED ORDER — FENTANYL CITRATE (PF) 100 MCG/2ML IJ SOLN: 25.0000 ug | INTRAMUSCULAR | Status: DC | PRN

## 2019-09-10 SURGICAL SUPPLY — 72 items
BAG ZIPLOCK 12X15 (MISCELLANEOUS) ×2 IMPLANT
BIT DRILL 1.6MX128 (BIT) IMPLANT
BIT DRILL 1.6MX128MM (BIT)
BIT DRILL 170X2.5X (BIT) IMPLANT
BIT DRL 170X2.5X (BIT) ×1
BLADE SAG 18X100X1.27 (BLADE) ×3 IMPLANT
CLOSURE WOUND 1/2 X4 (GAUZE/BANDAGES/DRESSINGS) ×1
COVER BACK TABLE 60X90IN (DRAPES) ×3 IMPLANT
COVER SURGICAL LIGHT HANDLE (MISCELLANEOUS) ×3 IMPLANT
COVER WAND RF STERILE (DRAPES) IMPLANT
CUP HUMERAL 42 PLUS 3 (Orthopedic Implant) ×2 IMPLANT
DECANTER SPIKE VIAL GLASS SM (MISCELLANEOUS) ×3 IMPLANT
DRAPE INCISE IOBAN 66X45 STRL (DRAPES) ×3 IMPLANT
DRAPE ORTHO SPLIT 77X108 STRL (DRAPES) ×4
DRAPE SHEET LG 3/4 BI-LAMINATE (DRAPES) ×3 IMPLANT
DRAPE SURG ORHT 6 SPLT 77X108 (DRAPES) ×2 IMPLANT
DRAPE U-SHAPE 47X51 STRL (DRAPES) ×3 IMPLANT
DRILL 2.5 (BIT) ×2
DRSG ADAPTIC 3X8 NADH LF (GAUZE/BANDAGES/DRESSINGS) ×3 IMPLANT
DRSG PAD ABDOMINAL 8X10 ST (GAUZE/BANDAGES/DRESSINGS) ×3 IMPLANT
DURAPREP 26ML APPLICATOR (WOUND CARE) ×3 IMPLANT
ELECT BLADE TIP CTD 4 INCH (ELECTRODE) ×3 IMPLANT
ELECT NDL TIP 2.8 STRL (NEEDLE) ×1 IMPLANT
ELECT NEEDLE TIP 2.8 STRL (NEEDLE) ×3 IMPLANT
ELECT REM PT RETURN 15FT ADLT (MISCELLANEOUS) ×3 IMPLANT
EPIPHYSI RIGHT SZ 2 (Shoulder) ×3 IMPLANT
EPIPHYSIS RIGHT SZ 2 (Shoulder) IMPLANT
GAUZE SPONGE 4X4 12PLY STRL (GAUZE/BANDAGES/DRESSINGS) ×3 IMPLANT
GLENOSPHERE XTEND RSA 42 SD +2 (Joint) ×2 IMPLANT
GLOVE BIOGEL PI ORTHO PRO 7.5 (GLOVE) ×2
GLOVE BIOGEL PI ORTHO PRO SZ8 (GLOVE) ×2
GLOVE ORTHO TXT STRL SZ7.5 (GLOVE) ×3 IMPLANT
GLOVE PI ORTHO PRO STRL 7.5 (GLOVE) ×1 IMPLANT
GLOVE PI ORTHO PRO STRL SZ8 (GLOVE) ×1 IMPLANT
GLOVE SURG ORTHO 8.5 STRL (GLOVE) ×3 IMPLANT
GOWN STRL REUS W/TWL XL LVL3 (GOWN DISPOSABLE) ×6 IMPLANT
KIT BASIN (CUSTOM PROCEDURE TRAY) ×3 IMPLANT
KIT TURNOVER KIT A (KITS) IMPLANT
MANIFOLD NEPTUNE II (INSTRUMENTS) ×3 IMPLANT
METAGLENE DELTA EXTEND (Trauma) IMPLANT
METAGLENE DXTEND (Trauma) ×3 IMPLANT
NDL MAYO CATGUT SZ4 TPR NDL (NEEDLE) IMPLANT
NEEDLE MAYO CATGUT SZ4 (NEEDLE) IMPLANT
NS IRRIG 1000ML POUR BTL (IV SOLUTION) ×3 IMPLANT
PACK SHOULDER (CUSTOM PROCEDURE TRAY) ×3 IMPLANT
PENCIL SMOKE EVACUATOR (MISCELLANEOUS) IMPLANT
PIN GUIDE 1.2 (PIN) ×2 IMPLANT
PIN GUIDE GLENOPHERE 1.5MX300M (PIN) ×2 IMPLANT
PIN METAGLENE 2.5 (PIN) ×2 IMPLANT
PROTECTOR NERVE ULNAR (MISCELLANEOUS) ×3 IMPLANT
RESTRAINT HEAD UNIVERSAL NS (MISCELLANEOUS) ×3 IMPLANT
SCREW 4.5X18MM (Screw) ×2 IMPLANT
SCREW 4.5X36MM (Screw) ×2 IMPLANT
SCREW BN 18X4.5XSTRL SHLDR (Screw) IMPLANT
SCREW LOCK 42 (Screw) ×2 IMPLANT
SLING ARM FOAM STRAP LRG (SOFTGOODS) ×2 IMPLANT
SMARTMIX MINI TOWER (MISCELLANEOUS)
SPONGE LAP 4X18 RFD (DISPOSABLE) IMPLANT
STEM DELTA DIA 10 HA (Stem) ×2 IMPLANT
STRIP CLOSURE SKIN 1/2X4 (GAUZE/BANDAGES/DRESSINGS) ×2 IMPLANT
SUCTION FRAZIER HANDLE 10FR (MISCELLANEOUS) ×2
SUCTION TUBE FRAZIER 10FR DISP (MISCELLANEOUS) ×1 IMPLANT
SUT FIBERWIRE #2 38 T-5 BLUE (SUTURE) ×6
SUT MNCRL AB 4-0 PS2 18 (SUTURE) ×3 IMPLANT
SUT VIC AB 0 CT1 36 (SUTURE) ×6 IMPLANT
SUT VIC AB 0 CT2 27 (SUTURE) ×3 IMPLANT
SUT VIC AB 2-0 CT1 27 (SUTURE) ×2
SUT VIC AB 2-0 CT1 TAPERPNT 27 (SUTURE) ×1 IMPLANT
SUTURE FIBERWR #2 38 T-5 BLUE (SUTURE) ×2 IMPLANT
TOWEL OR 17X26 10 PK STRL BLUE (TOWEL DISPOSABLE) ×3 IMPLANT
TOWER SMARTMIX MINI (MISCELLANEOUS) IMPLANT
YANKAUER SUCT BULB TIP 10FT TU (MISCELLANEOUS) ×3 IMPLANT

## 2019-09-10 NOTE — Anesthesia Preprocedure Evaluation (Addendum)
Anesthesia Evaluation  Patient identified by MRN, date of birth, ID band Patient awake    Reviewed: Allergy & Precautions, H&P , NPO status , Patient's Chart, lab work & pertinent test results, reviewed documented beta blocker date and time   Airway Mallampati: II  TM Distance: >3 FB Neck ROM: Limited    Dental no notable dental hx. (+) Teeth Intact, Dental Advisory Given, Caps   Pulmonary neg pulmonary ROS, former smoker,    Pulmonary exam normal breath sounds clear to auscultation       Cardiovascular Exercise Tolerance: Good hypertension, Pt. on medications and Pt. on home beta blockers negative cardio ROS   Rhythm:regular Rate:Normal     Neuro/Psych negative neurological ROS  negative psych ROS   GI/Hepatic negative GI ROS, Neg liver ROS,   Endo/Other  negative endocrine ROS  Renal/GU negative Renal ROS  negative genitourinary   Musculoskeletal  (+) Arthritis , Osteoarthritis,    Abdominal   Peds  Hematology negative hematology ROS (+)   Anesthesia Other Findings   Reproductive/Obstetrics negative OB ROS                           Anesthesia Physical Anesthesia Plan  ASA: II  Anesthesia Plan: General   Post-op Pain Management: GA combined w/ Regional for post-op pain   Induction:   PONV Risk Score and Plan:   Airway Management Planned: LMA and Oral ETT  Additional Equipment:   Intra-op Plan:   Post-operative Plan:   Informed Consent: I have reviewed the patients History and Physical, chart, labs and discussed the procedure including the risks, benefits and alternatives for the proposed anesthesia with the patient or authorized representative who has indicated his/her understanding and acceptance.     Dental Advisory Given  Plan Discussed with: CRNA, Anesthesiologist and Surgeon  Anesthesia Plan Comments:         Anesthesia Quick Evaluation

## 2019-09-10 NOTE — Progress Notes (Signed)
Assisted Dr. Oddono with right, ultrasound guided, interscalene  block. Side rails up, monitors on throughout procedure. See vital signs in flow sheet. Tolerated Procedure well. 

## 2019-09-10 NOTE — Brief Op Note (Signed)
09/10/2019  3:08 PM  PATIENT:  Douglas [name]  74 y.o. male  PRE-OPERATIVE DIAGNOSIS:  Right shoulder cuff arthropathy  POST-OPERATIVE DIAGNOSIS:  Right shoulder cuff arthropathy  PROCEDURE:  Procedure(s) with comments: REVERSE SHOULDER ARTHROPLASTY (Right) - interscalene block DePuy Delta Xtend, no subscap repair  SURGEON:  Surgeon(s) and Role:    [name], MD - Primary  PHYSICIAN ASSISTANT:   ASSISTANTS: Ventura Bruns, PA-C   ANESTHESIA:   regional and general  EBL:  Less than 100 cc  BLOOD ADMINISTERED:none  DRAINS: none   LOCAL MEDICATIONS USED:  MARCAINE     SPECIMEN:  No Specimen  DISPOSITION OF SPECIMEN:  N/A  COUNTS:  YES  TOURNIQUET:  * No tourniquets in log *  DICTATION: .Other Dictation: Dictation Number [id number]  PLAN OF CARE: Admit to inpatient   PATIENT DISPOSITION:  PACU - hemodynamically stable.   Delay start of Pharmacological VTE agent (>24hrs) due to surgical blood loss or risk of bleeding: not applicable

## 2019-09-10 NOTE — Anesthesia Procedure Notes (Addendum)
Anesthesia Regional Block: Interscalene brachial plexus block   Pre-Anesthetic Checklist: ,, timeout performed, Correct Patient, Correct Site, Correct Laterality, Correct Procedure, Correct Position, site marked, Risks and benefits discussed,  Surgical consent,  Pre-op evaluation,  At surgeon's request and post-op pain management  Laterality: Right  Prep: chloraprep       Needles:  Injection technique: Single-shot  Needle Type: Echogenic Stimulator Needle     Needle Length: 5cm  Needle Gauge: 22     Additional Needles:   Procedures:, nerve stimulator,,, ultrasound used (permanent image in chart),,,,   Nerve Stimulator or Paresthesia:  Response: quadraceps contraction, 0.45 mA,   Additional Responses:   Narrative:  Start time: 09/10/2019 12:23 PM End time: 09/10/2019 12:27 PM Injection made incrementally with aspirations every 5 mL.  Performed by: Personally  Anesthesiologist: Janeece Riggers, MD  Additional Notes: Functioning IV was confirmed and monitors were applied.  A 79mm 22ga Arrow echogenic stimulator needle was used. Sterile prep and drape,hand hygiene and sterile gloves were used. Ultrasound guidance: relevant anatomy identified, needle position confirmed, local anesthetic spread visualized around nerve(s)., vascular puncture avoided.  Image printed for medical record. Negative aspiration and negative test dose prior to incremental administration of local anesthetic. The patient tolerated the procedure well.

## 2019-09-10 NOTE — Transfer of Care (Signed)
Immediate Anesthesia Transfer of Care Note  Patient: Douglas [name]  Procedure(s) Performed: REVERSE SHOULDER ARTHROPLASTY (Right Shoulder)  Patient Location: PACU  Anesthesia Type:General  Level of Consciousness: awake, alert , oriented and patient cooperative  Airway & Oxygen Therapy: Patient Spontanous Breathing and Patient connected to face mask oxygen  Post-op Assessment: Report given to RN, Post -op Vital signs reviewed and stable and Patient moving all extremities X 4  Post vital signs: stable  Last Vitals:  Vitals Value Taken Time  BP 130/66 09/10/19 1515  Temp    Pulse 56 09/10/19 1519  Resp 13 09/10/19 1519  SpO2 100 % 09/10/19 1519  Vitals shown include unvalidated device data.  Last Pain:  Vitals:   09/10/19 1306  TempSrc:   PainSc: 0-No pain      Patients Stated Pain Goal: 4 ([id number])  Complications: No apparent anesthesia complications

## 2019-09-10 NOTE — Anesthesia Procedure Notes (Signed)
Procedure Name: Intubation Date/Time: 09/10/2019 1:35 PM Performed by: Lissa Morales, CRNA Pre-anesthesia Checklist: Patient identified, Emergency Drugs available, Suction available and Patient being monitored Patient Re-evaluated:Patient Re-evaluated prior to induction Oxygen Delivery Method: Circle system utilized Preoxygenation: Pre-oxygenation with 100% oxygen Induction Type: IV induction Ventilation: Mask ventilation without difficulty Laryngoscope Size: Mac and 4 Tube type: Oral Tube size: 7.5 mm Number of attempts: 1 Airway Equipment and Method: Stylet and Oral airway Placement Confirmation: ETT inserted through vocal cords under direct vision,  positive ETCO2 and breath sounds checked- equal and bilateral Secured at: 21 cm Tube secured with: Tape Dental Injury: Teeth and Oropharynx as per pre-operative assessment

## 2019-09-10 NOTE — Interval H&P Note (Signed)
History and Physical Interval Note:  09/10/2019 12:10 PM  Douglas [name]  has presented today for surgery, with the diagnosis of Right shoulder cuff arthropathy.  The various methods of treatment have been discussed with the patient and family. After consideration of risks, benefits and other options for treatment, the patient has consented to  Procedure(s) with comments: REVERSE SHOULDER ARTHROPLASTY (Right) - interscalene block as a surgical intervention.  The patient's history has been reviewed, patient examined, no change in status, stable for surgery.  I have reviewed the patient's chart and labs.  Questions were answered to the patient's satisfaction.     Augustin Schooling

## 2019-09-10 NOTE — Anesthesia Postprocedure Evaluation (Signed)
Anesthesia Post Note  Patient: Douglas [name]  Procedure(s) Performed: REVERSE SHOULDER ARTHROPLASTY (Right Shoulder)     Patient location during evaluation: PACU Anesthesia Type: General Level of consciousness: awake and alert Pain management: pain level controlled Vital Signs Assessment: post-procedure vital signs reviewed and stable Respiratory status: spontaneous breathing, nonlabored ventilation, respiratory function stable and patient connected to nasal cannula oxygen Cardiovascular status: stable and blood pressure returned to baseline Postop Assessment: no apparent nausea or vomiting Anesthetic complications: no    Last Vitals:  Vitals:   09/10/19 1515 09/10/19 1530  BP: 130/66 (!) 117/57  Pulse: 61 (!) 53  Resp: 17 13  Temp:  (!) 36.2 C  SpO2: 100% 99%    Last Pain:  Vitals:   09/10/19 1511  TempSrc:   PainSc: 0-No pain                 Rozena Fierro

## 2019-09-10 NOTE — Discharge Instructions (Signed)
Ice to the shoulder constantly.  Keep the incision covered and clean and dry for one week, then ok to get it wet in the shower. ° °Do exercise as instructed several times per day. ° °DO NOT reach behind your back or push up out of a chair with the operative arm. ° °Use a sling while you are up and around for comfort, may remove while seated.  Keep pillow propped behind the operative elbow. ° °Follow up with Dr Francesa Eugenio in two weeks in the office, call [phone] for appt °

## 2019-09-11 LAB — BASIC METABOLIC PANEL
Anion gap: 7 (ref 5–15)
BUN: 24 mg/dL — ABNORMAL HIGH (ref 8–23)
CO2: 24 mmol/L (ref 22–32)
Calcium: 8.3 mg/dL — ABNORMAL LOW (ref 8.9–10.3)
Chloride: 105 mmol/L (ref 98–111)
Creatinine, Ser: 1.06 mg/dL (ref 0.61–1.24)
GFR calc Af Amer: >60 mL/min (ref >60)
GFR calc non Af Amer: >60 mL/min (ref >60)
Glucose, Bld: 109 mg/dL — ABNORMAL HIGH (ref 70–99)
Potassium: 4.4 mmol/L (ref 3.5–5.1)
Sodium: 136 mmol/L (ref 135–145)

## 2019-09-11 LAB — HEMOGLOBIN AND HEMATOCRIT, BLOOD
HCT: 35 % — ABNORMAL LOW (ref 39.0–52.0)
Hemoglobin: 11.4 g/dL — ABNORMAL LOW (ref 13.0–17.0)

## 2019-09-11 NOTE — Progress Notes (Signed)
Orthopedics Progress Note  Subjective: Patient comfortable this AM. No complaints  Objective:  Vitals:   09/11/19 0144 09/11/19 0544  BP: (!) 109/50 (!) 115/54  Pulse: (!) 51 (!) 54  Resp: 18 18  Temp: 98.2 F (36.8 C) 97.7 F (36.5 C)  SpO2: 97% 99%    General: Awake and alert  Musculoskeletal: right shoulder dressing changed. Wound looks good. No erythema or drainage Neurovascularly intact  Lab Results  Component Value Date   WBC 8.2 09/03/2019   HGB 11.4 (L) 09/11/2019   HCT 35.0 (L) 09/11/2019   MCV 98.8 09/03/2019   PLT 246 09/03/2019       Component Value Date/Time   NA 136 09/11/2019 0346   K 4.4 09/11/2019 0346   CL 105 09/11/2019 0346   CO2 24 09/11/2019 0346   GLUCOSE 109 (H) 09/11/2019 0346   BUN 24 (H) 09/11/2019 0346   CREATININE 1.06 09/11/2019 0346   CALCIUM 8.3 (L) 09/11/2019 0346   GFRNONAA >60 09/11/2019 0346   GFRAA >60 09/11/2019 0346    No results found for: INR, PROTIME  Assessment/Plan: POD #1 s/p Procedure(s): REVERSE SHOULDER ARTHROPLASTY Discharge to home after therapy today. Do home exercies as instructed Follow up in two weeks with Dr Esmond Plants R. Veverly Fells, MD 09/11/2019 7:28 AM

## 2019-09-11 NOTE — Discharge Summary (Signed)
Orthopedic Discharge Summary        Physician Discharge Summary  Patient ID: Douglas [name] MRN: [id number] DOB/AGE: 1945-08-14 74 y.o.  Admit date: 09/10/2019 Discharge date: 09/11/2019   Procedures:  Procedure(s) (LRB): REVERSE SHOULDER ARTHROPLASTY (Right)  Attending Physician:  Dr. Esmond Plants  Admission Diagnoses:   Right shoulder OA, RC insufficiency  Discharge Diagnoses:  same   Past Medical History:  Diagnosis Date  . Arthritis   . Cancer (HCC)    Skin, and Kidney  . Hypertension   . Pre-diabetes     PCP: [name], MD   Discharged Condition: good  Hospital Course:  Patient underwent the above stated procedure on 09/10/2019. Patient tolerated the procedure well and brought to the recovery room in good condition and subsequently to the floor. Patient had an uncomplicated hospital course and was stable for discharge.   Disposition: Discharge disposition: 01-Home or Self Care      with follow up in 2 weeks   Follow-up Information    [name], MD. Call in 2 weeks.   Specialty: Orthopedic Surgery Why: [phone] Contact information: [address] [place] [address] [id number]           Discharge Instructions    Call MD / Call 911   Complete by: As directed    If you experience chest pain or shortness of breath, CALL 911 and be transported to the hospital emergency room.  If you develope a fever above 101 F, pus (white drainage) or increased drainage or redness at the wound, or calf pain, call your surgeon's office.   Constipation Prevention   Complete by: As directed    Drink plenty of fluids.  Prune juice may be helpful.  You may use a stool softener, such as Colace (over the counter) 100 mg twice a day.  Use MiraLax (over the counter) for constipation as needed.   Diet - low sodium heart healthy   Complete by: As directed    Driving restrictions   Complete by: As directed    No driving for 2 weeks   Increase  activity slowly as tolerated   Complete by: As directed       Allergies as of 09/11/2019   No Known Allergies     Medication List    TAKE these medications   amLODipine 10 MG tablet Commonly known as: NORVASC Take 10 mg by mouth daily.   atenolol 25 MG tablet Commonly known as: TENORMIN Take 25 mg by mouth daily.   cholecalciferol 25 MCG (1000 UNIT) tablet Commonly known as: VITAMIN D3 Take 1,000 Units by mouth daily.   diclofenac 75 MG EC tablet Commonly known as: VOLTAREN Take 75 mg by mouth daily.   HYDROcodone-acetaminophen 5-325 MG tablet Commonly known as: Norco Take 1-2 tablets by mouth every 6 (six) hours as needed for moderate pain or severe pain.   lisinopril 40 MG tablet Commonly known as: ZESTRIL Take 40 mg by mouth daily.   methocarbamol 500 MG tablet Commonly known as: Robaxin Take 1 tablet (500 mg total) by mouth every 8 (eight) hours as needed.   multivitamin with minerals tablet Take 1 tablet by mouth daily.   simvastatin 20 MG tablet Commonly known as: ZOCOR Take 20 mg by mouth daily.   tamsulosin 0.4 MG Caps capsule Commonly known as: FLOMAX Take 0.8 mg by mouth daily.   VITAMIN K2 PO Take 1 tablet by mouth daily.  Signed: Augustin Schooling 09/11/2019, 7:31 AM  [facility] is now [facility] [address] [place], [place], [place] [address] Phone: [place]

## 2019-09-11 NOTE — Plan of Care (Signed)
  Problem: Education: Goal: Knowledge of the prescribed therapeutic regimen will improve Outcome: Progressing   Problem: Pain Management: Goal: Pain level will decrease with appropriate interventions Outcome: Progressing   

## 2019-09-11 NOTE — Plan of Care (Signed)
Patient discharged home in stable condition. Waiting on his ride

## 2019-09-11 NOTE — Evaluation (Signed)
Occupational Therapy Evaluation Patient Details Name: Douglas [name] MRN: [id number] DOB: 1945-12-17 Today's Date: 09/11/2019    History of Present Illness Patient is a 74 year old man s/p right shoulder replacement   Clinical Impression   Patient presents s/p total shoulder replacement with shoulder precautions and ROM limitations. Therapist provided education and instruction to patient in regards to exercises, precautions, ROM guidelines, positioning, donning upper extremity clothing and bathing while maintaining shoulder precautions, ice and edema management and donning/doffing sling. Patient verbalized understanding and demonstrated as needed. Patient needed assistance to donn shirt and demonstrated ability to donn lower body clothing with verbal cues to not use right hand. Patient to follow up with MD for further therapy needs.      Follow Up Recommendations  Follow surgeon's recommendation for DC plan and follow-up therapies    Equipment Recommendations  None recommended by OT    Recommendations for Other Services       Precautions / Restrictions Precautions Precautions: Shoulder Shoulder Interventions: Shoulder sling/immobilizer;Off for dressing/bathing/exercises;For comfort(for sleep) Precaution Comments: AROM elbow, wrist and hand okay, AROM/PROM Forward Flexion 0-90 deg, AROM/PROM Abduction 0-60 deg, AROM/PRM External Rotation 0-30 deg Required Braces or Orthoses: Sling Restrictions Weight Bearing Restrictions: Yes RUE Weight Bearing: Non weight bearing      Mobility Bed Mobility               General bed mobility comments: Patient supervision to transfer to side of the bed on the left.  Transfers Overall transfer level: Independent               General transfer comment: Supervision for safety to ambulate in room. No unsafe movement or loss of balance.    Balance                                           ADL either performed or  assessed with clinical judgement   ADL Overall ADL's : Needs assistance/impaired Eating/Feeding: Set up   Grooming: Independent;Wash/dry face;Wash/dry hands;Oral care   Upper Body Bathing: Set up   Lower Body Bathing: Set up   Upper Body Dressing : Moderate assistance Upper Body Dressing Details (indicate cue type and reason): Assistance to donn jacket during instruction/education on shoulder precautions. Lower Body Dressing: Set up   Toilet Transfer: Supervision/safety   Toileting- Clothing Manipulation and Hygiene: Supervision/safety         General ADL Comments: verbal cues for shoulder precautions needed. Instruction/education on how to perform ADL tasks secondary to shoulder precautions.     Vision         Perception     Praxis      Pertinent Vitals/Pain Pain Assessment: Faces Faces Pain Scale: Hurts a little bit Pain Descriptors / Indicators: Aching;Grimacing Pain Intervention(s): Limited activity within patient's tolerance     Hand Dominance     Extremity/Trunk Assessment Upper Extremity Assessment Upper Extremity Assessment: RUE deficits/detail RUE Deficits / Details: Decreased ROM/strength, shoulder precautions.       Cervical / Trunk Assessment Cervical / Trunk Assessment: Normal   Communication Communication Communication: No difficulties   Cognition Arousal/Alertness: Awake/alert Behavior During Therapy: WFL for tasks assessed/performed Overall Cognitive Status: Within Functional Limits for tasks assessed  General Comments       Exercises     Shoulder Instructions Shoulder Instructions Donning/doffing shirt without moving shoulder: Moderate assistance Method for sponge bathing under operated UE: Patient able to independently direct caregiver Donning/doffing sling/immobilizer: Independent;Patient able to independently direct caregiver Correct positioning of sling/immobilizer:  Independent;Patient able to independently direct caregiver ROM for elbow, wrist and digits of operated UE: Independent;Patient able to independently direct caregiver Sling wearing schedule (on at all times/off for ADL's): Independent;Patient able to independently direct caregiver Proper positioning of operated UE when showering: Independent;Patient able to independently direct caregiver Positioning of UE while sleeping: Patient able to independently direct caregiver    Home Living Family/patient expects to be discharged to:: Private residence Living Arrangements: Children Available Help at Discharge: Available PRN/intermittently Type of Home: House Home Access: Stairs to enter     Home Layout: One level     Bathroom Shower/Tub: Teacher, early years/pre: Standard Bathroom Accessibility: Yes   Home Equipment: None          Prior Functioning/Environment Level of Independence: Independent        Comments: works at [employer].        OT Problem List: Decreased strength;Decreased range of motion;Impaired UE functional use;Pain      OT Treatment/Interventions:      OT Goals(Current goals can be found in the care plan section)    OT Frequency:     Barriers to D/C:            Co-evaluation              AM-PAC OT "6 Clicks" Daily Activity     Outcome Measure Help from another person eating meals?: A Little Help from another person taking care of personal grooming?: None Help from another person toileting, which includes using toliet, bedpan, or urinal?: A Little Help from another person bathing (including washing, rinsing, drying)?: A Little Help from another person to put on and taking off regular upper body clothing?: A Lot Help from another person to put on and taking off regular lower body clothing?: A Little 6 Click Score: 18   End of Session Nurse Communication: (okay to DC)  Activity Tolerance: Patient tolerated treatment well Patient left:  in chair  OT Visit Diagnosis: Muscle weakness (generalized) (M62.81);Pain                Time: [id number] OT Time Calculation (min): 38 min Charges:  OT General Charges $OT Visit: 1 Visit OT Evaluation $OT Eval Moderate Complexity: 1 Mod OT Treatments $Self Care/Home Management : 23-37 mins  Derl Barrow, OTR/L Acute Care Rehab Services  Office [phone]   Lenward Chancellor 09/11/2019, 10:49 AM

## 2019-09-11 NOTE — Op Note (Signed)
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  PROCEDURE PERFORMED:  Right reverse total shoulder replacement using DePuy Delta Xtend prosthesis with no subscapularis repair.  ATTENDING SURGEON:  Esmond Plants, MD  ASSISTANT:  Darol Destine, [place], who was scrubbed during the entire procedure and necessary for satisfactory completion of surgery.   ANESTHESIA:  General anesthesia was used plus interscalene block.  ESTIMATED BLOOD LOSS:  Minimal.  FLUID REPLACEMENT:  1200 mL crystalloid.  INSTRUMENT COUNTS:  Correct.  COMPLICATIONS:  There were no complications.  ANTIBIOTICS:  Perioperative antibiotics were given.  INDICATIONS:  The patient is a 74 year old male with a history of worsening right shoulder pain and dysfunction secondary to full full-thickness and retracted tear of the supra and infraspinatus tendons.  The patient's teres minor was intact and  subscapularis looks also be intact, but somewhat atrophied.  Risks and benefits of surgical management discussed in detail with the patient.  He elected to move forward with reverse shoulder replacement to restore fixed focal mechanics and to decrease  pain in the shoulder.  Informed consent obtained.  DESCRIPTION OF PROCEDURE:  After an adequate level of anesthesia was achieved, the patient was positioned in modified beach chair position.  Right shoulder correctly identified and sterilely prepped and draped in the usual manner.  Time-out called,  verifying correct patient, correct site, we entered the patient's shoulder using a deltopectoral incision,  starting at the coracoid process extending down to the anterior humerus dissection down through subcutaneous tissues using needle tip Bovie.  We  identified the cephalic vein, took that laterally with the deltoid pectoralis taken medially.  Conjoined tendon identified and retracted medially.  We placed our deep retractors, identified the biceps tendon whipstitched with 0 Vicryl suture  figure-of-eight 2 to secure the biceps tendon within the biceps sheath and incorporating part of the pectoralis tendon insertion.  At this point, we released the subscapularis remnant off the lesser tuberosity, it was extremely thin maybe 1-2 mm of  thickness throughout the bottom two-thirds of the tendon and the upper part was a little thicker, but was fairly well scarred up to the undersurface of the coracoid process.  The decision was made due to how thin the subscap was intact and likely chronic  lack of excursion that we would not repair it.  We just tagged it for retraction of the axillary nerve and protection of the axillary nerve.  We then released the inferior scapular inferior capsule off the neck progressively externally rotating.  We  then extended the shoulder and delivered the humeral head out of the wound, placing a brown retractor for good visualization.  We placed a Crego elevator medially.  We entered the proximal humerus with a 6 mm reamer, reaming up to a size 10 mm.  We then  placed our 10 mm intramedullary resection guide and resected the head with an oscillating saw at 10 degrees of retroversion.  Once we had the head resected and removed excess osteophytes with a rongeur.  Next, we subluxed the humerus posteriorly provided  excellent visualization of the glenoid and we did a 360-degree glenoid labrum and capsule removal.  We were able to visualize the glenoid face and determine the 6 o'clock inferior  scapular neck position.  We placed our guide pin for our reaming for  which did and we reamed and  prepared the glenoid for the metaglene baseplate.  We then drilled out the central peg hole impacted the metaglene into position, placed a 42 screw inferiorly, a 36 at the base of coracoid and an 18 nonlocked posteriorly, the  other two superior and inferior screws were locked.  With the baseplate secured, we selected a 42+2 glenosphere, we had not medialize the patient much, but we felt like for this individual.  We want to lateralize just a no-touch for stability and also to  load the external rotators and hopefully improve function.  We selected a 42 for stability.  At this point, we irrigated thoroughly with the glenosphere in place and secured the baseplate, utilizing the screwdriver and the impactor and the screwdriver  again.  We did a finger sweep to make sure there was no soft tissue caught up in that bearing.  We then went to the humeral side.  Again, completed our humeral preparation with the 2 right metaphyseal reamer.  Once we had that reamed, we impacted the 10  stem 2 right metaphysis set on the 0 setting and placed in 10 degrees of retroversion and impacted that we trialled with a 42+3 poly trial, had a nice stable shoulder a little pop as it reduced and good tension with the arm at the patient's side.  No gap  with inferior pole or external rotation.  At this point, we removed the trial components from the humeral side, irrigated thoroughly used an impaction grafting technique with available bone graft from the humeral head and impacted the HA coated  press-fit stem size 10 and the 2 right metaphysis set on the 0 setting and impacted in 10 degrees of retroversion.  We selected the real 42+3 poly, impacted that and humeral tray and reduced the shoulder.  We had a nice stable shoulder throughout a full  arc of motion.  We irrigated thoroughly and then closed in layers after resecting the subscap remnant.  We closed with 0 Vicryl for the deltopectoral interval followed by 2-0 Vicryl for  subcutaneous closure and 4-0 Monocryl for skin.  Steri-Strips  applied followed by sterile dressing.  The patient tolerated surgery well.  PN/NUANCE  D:09/10/2019 T:09/11/2019 JOB:[id number]

## 2019-09-13 ENCOUNTER — Encounter: Payer: Self-pay | Admitting: *Deleted

## 2019-09-14 ENCOUNTER — Other Ambulatory Visit: Payer: Self-pay | Admitting: *Deleted

## 2019-09-14 NOTE — Patient Outreach (Signed)
[facility]) Care Management  09/14/2019  Douglas [name] [birth year] [id number]   Red on EMMI Alert   Day:1 Date:09/13/19 Red Alert Reason : Scheduled Follow up?- "No" per patient response.    Outreach Attempt #1 Subjective: Unsuccessful outreach call to patient , no answer able to leave a HIPAA compliant message for return call.   [place] call from [facility], explained reason for the call and [facility] care management services.  Addressed reason for EMMI red alert, patient states that he scheduled follow up visit with Dr. Veverly Fells appointment is on 5/20.  Patient states that he is getting along pretty good after right shoulder surgery. He reports having family support at this time to assist in recovery  after discharge and he reports managing with  ADL's. He states that his son will be able to drive him to appointment.  He denies any other concerns at this time.   Plan Will close case to [facility] care management no further needs care management needs to be addressed.    Joylene Draft, RN, BSN  [place] Management Coordinator  [phone]- Mobile [phone]- Toll Free Main Office

## 2019-09-22 DIAGNOSIS — Z4789 Encounter for other orthopedic aftercare: Secondary | ICD-10-CM | POA: Insufficient documentation

## 2019-09-23 DIAGNOSIS — Z4789 Encounter for other orthopedic aftercare: Secondary | ICD-10-CM | POA: Diagnosis not present

## 2019-10-03 DIAGNOSIS — I10 Essential (primary) hypertension: Secondary | ICD-10-CM | POA: Diagnosis not present

## 2019-10-04 DIAGNOSIS — I10 Essential (primary) hypertension: Secondary | ICD-10-CM | POA: Diagnosis not present

## 2019-10-04 DIAGNOSIS — E785 Hyperlipidemia, unspecified: Secondary | ICD-10-CM | POA: Diagnosis not present

## 2019-10-04 DIAGNOSIS — M1990 Unspecified osteoarthritis, unspecified site: Secondary | ICD-10-CM | POA: Diagnosis not present

## 2019-10-19 DIAGNOSIS — I8390 Asymptomatic varicose veins of unspecified lower extremity: Secondary | ICD-10-CM | POA: Diagnosis not present

## 2019-10-19 DIAGNOSIS — Z299 Encounter for prophylactic measures, unspecified: Secondary | ICD-10-CM | POA: Diagnosis not present

## 2019-10-19 DIAGNOSIS — M25519 Pain in unspecified shoulder: Secondary | ICD-10-CM | POA: Diagnosis not present

## 2019-10-19 DIAGNOSIS — I10 Essential (primary) hypertension: Secondary | ICD-10-CM | POA: Diagnosis not present

## 2019-11-03 DIAGNOSIS — I10 Essential (primary) hypertension: Secondary | ICD-10-CM | POA: Diagnosis not present

## 2019-11-16 DIAGNOSIS — Z4789 Encounter for other orthopedic aftercare: Secondary | ICD-10-CM | POA: Diagnosis not present

## 2019-11-17 ENCOUNTER — Other Ambulatory Visit: Payer: Self-pay | Admitting: Physician Assistant

## 2019-11-19 ENCOUNTER — Other Ambulatory Visit: Payer: Self-pay | Admitting: Physician Assistant

## 2019-11-22 ENCOUNTER — Other Ambulatory Visit: Payer: Self-pay

## 2019-11-22 ENCOUNTER — Ambulatory Visit
Admission: RE | Admit: 2019-11-22 | Discharge: 2019-11-22 | Disposition: A | Discharge status: Home / Self Care | Payer: Medicare HMO | Source: Ambulatory Visit | Attending: Physician Assistant | Admitting: Physician Assistant

## 2019-11-22 ENCOUNTER — Other Ambulatory Visit: Payer: Self-pay | Admitting: Physician Assistant

## 2019-11-22 DIAGNOSIS — I7090 Unspecified atherosclerosis: Secondary | ICD-10-CM | POA: Diagnosis not present

## 2019-11-22 DIAGNOSIS — Z4789 Encounter for other orthopedic aftercare: Secondary | ICD-10-CM

## 2019-11-22 DIAGNOSIS — M25511 Pain in right shoulder: Secondary | ICD-10-CM | POA: Diagnosis not present

## 2019-12-03 DIAGNOSIS — E7849 Other hyperlipidemia: Secondary | ICD-10-CM | POA: Diagnosis not present

## 2019-12-03 DIAGNOSIS — M1990 Unspecified osteoarthritis, unspecified site: Secondary | ICD-10-CM | POA: Diagnosis not present

## 2019-12-03 DIAGNOSIS — I10 Essential (primary) hypertension: Secondary | ICD-10-CM | POA: Diagnosis not present

## 2019-12-14 DIAGNOSIS — S42121G Displaced fracture of acromial process, right shoulder, subsequent encounter for fracture with delayed healing: Secondary | ICD-10-CM | POA: Diagnosis not present

## 2019-12-14 DIAGNOSIS — M25511 Pain in right shoulder: Secondary | ICD-10-CM | POA: Diagnosis not present

## 2019-12-27 DIAGNOSIS — G8918 Other acute postprocedural pain: Secondary | ICD-10-CM | POA: Diagnosis not present

## 2019-12-27 DIAGNOSIS — S42121A Displaced fracture of acromial process, right shoulder, initial encounter for closed fracture: Secondary | ICD-10-CM | POA: Diagnosis not present

## 2020-01-04 DIAGNOSIS — I10 Essential (primary) hypertension: Secondary | ICD-10-CM | POA: Diagnosis not present

## 2020-01-11 DIAGNOSIS — Z4789 Encounter for other orthopedic aftercare: Secondary | ICD-10-CM | POA: Diagnosis not present

## 2020-02-03 DIAGNOSIS — E7849 Other hyperlipidemia: Secondary | ICD-10-CM | POA: Diagnosis not present

## 2020-02-03 DIAGNOSIS — M1990 Unspecified osteoarthritis, unspecified site: Secondary | ICD-10-CM | POA: Diagnosis not present

## 2020-02-03 DIAGNOSIS — I10 Essential (primary) hypertension: Secondary | ICD-10-CM | POA: Diagnosis not present

## 2020-02-08 DIAGNOSIS — Z4789 Encounter for other orthopedic aftercare: Secondary | ICD-10-CM | POA: Diagnosis not present

## 2020-02-08 DIAGNOSIS — M25512 Pain in left shoulder: Secondary | ICD-10-CM | POA: Diagnosis not present

## 2020-03-04 DIAGNOSIS — I10 Essential (primary) hypertension: Secondary | ICD-10-CM | POA: Diagnosis not present

## 2020-03-07 DIAGNOSIS — Z4789 Encounter for other orthopedic aftercare: Secondary | ICD-10-CM | POA: Diagnosis not present

## 2020-04-04 DIAGNOSIS — N400 Enlarged prostate without lower urinary tract symptoms: Secondary | ICD-10-CM | POA: Diagnosis not present

## 2020-04-04 DIAGNOSIS — I10 Essential (primary) hypertension: Secondary | ICD-10-CM | POA: Diagnosis not present

## 2020-04-06 DIAGNOSIS — Z4789 Encounter for other orthopedic aftercare: Secondary | ICD-10-CM | POA: Diagnosis not present

## 2020-04-17 DIAGNOSIS — Z7189 Other specified counseling: Secondary | ICD-10-CM | POA: Diagnosis not present

## 2020-04-17 DIAGNOSIS — I10 Essential (primary) hypertension: Secondary | ICD-10-CM | POA: Diagnosis not present

## 2020-04-17 DIAGNOSIS — Z79899 Other long term (current) drug therapy: Secondary | ICD-10-CM | POA: Diagnosis not present

## 2020-04-17 DIAGNOSIS — R5383 Other fatigue: Secondary | ICD-10-CM | POA: Diagnosis not present

## 2020-04-17 DIAGNOSIS — Z299 Encounter for prophylactic measures, unspecified: Secondary | ICD-10-CM | POA: Diagnosis not present

## 2020-04-17 DIAGNOSIS — I700 Atherosclerosis of aorta: Secondary | ICD-10-CM | POA: Diagnosis not present

## 2020-04-17 DIAGNOSIS — Z1331 Encounter for screening for depression: Secondary | ICD-10-CM | POA: Diagnosis not present

## 2020-04-17 DIAGNOSIS — Z0000 Encounter for general adult medical examination without abnormal findings: Secondary | ICD-10-CM | POA: Diagnosis not present

## 2020-04-17 DIAGNOSIS — Z6825 Body mass index (BMI) 25.0-25.9, adult: Secondary | ICD-10-CM | POA: Diagnosis not present

## 2020-04-17 DIAGNOSIS — E7800 Pure hypercholesterolemia, unspecified: Secondary | ICD-10-CM | POA: Diagnosis not present

## 2020-04-17 DIAGNOSIS — Z125 Encounter for screening for malignant neoplasm of prostate: Secondary | ICD-10-CM | POA: Diagnosis not present

## 2020-04-17 DIAGNOSIS — Z1339 Encounter for screening examination for other mental health and behavioral disorders: Secondary | ICD-10-CM | POA: Diagnosis not present

## 2020-04-24 DIAGNOSIS — I700 Atherosclerosis of aorta: Secondary | ICD-10-CM | POA: Diagnosis not present

## 2020-04-24 DIAGNOSIS — J8410 Pulmonary fibrosis, unspecified: Secondary | ICD-10-CM | POA: Diagnosis not present

## 2020-04-24 DIAGNOSIS — R918 Other nonspecific abnormal finding of lung field: Secondary | ICD-10-CM | POA: Diagnosis not present

## 2020-04-24 DIAGNOSIS — R911 Solitary pulmonary nodule: Secondary | ICD-10-CM | POA: Diagnosis not present

## 2020-04-24 DIAGNOSIS — I2510 Atherosclerotic heart disease of native coronary artery without angina pectoris: Secondary | ICD-10-CM | POA: Diagnosis not present

## 2020-05-04 DIAGNOSIS — I10 Essential (primary) hypertension: Secondary | ICD-10-CM | POA: Diagnosis not present

## 2020-05-05 DIAGNOSIS — I10 Essential (primary) hypertension: Secondary | ICD-10-CM | POA: Diagnosis not present

## 2020-05-05 DIAGNOSIS — E7849 Other hyperlipidemia: Secondary | ICD-10-CM | POA: Diagnosis not present

## 2020-05-18 DIAGNOSIS — Z87891 Personal history of nicotine dependence: Secondary | ICD-10-CM | POA: Diagnosis not present

## 2020-05-18 DIAGNOSIS — Z299 Encounter for prophylactic measures, unspecified: Secondary | ICD-10-CM | POA: Diagnosis not present

## 2020-05-18 DIAGNOSIS — I10 Essential (primary) hypertension: Secondary | ICD-10-CM | POA: Diagnosis not present

## 2020-05-18 DIAGNOSIS — Z6825 Body mass index (BMI) 25.0-25.9, adult: Secondary | ICD-10-CM | POA: Diagnosis not present

## 2020-05-18 DIAGNOSIS — I700 Atherosclerosis of aorta: Secondary | ICD-10-CM | POA: Diagnosis not present

## 2020-05-18 DIAGNOSIS — R911 Solitary pulmonary nodule: Secondary | ICD-10-CM | POA: Diagnosis not present

## 2020-05-25 DIAGNOSIS — Z20828 Contact with and (suspected) exposure to other viral communicable diseases: Secondary | ICD-10-CM | POA: Diagnosis not present

## 2020-06-05 DIAGNOSIS — I10 Essential (primary) hypertension: Secondary | ICD-10-CM | POA: Diagnosis not present

## 2020-06-05 DIAGNOSIS — E7849 Other hyperlipidemia: Secondary | ICD-10-CM | POA: Diagnosis not present

## 2020-06-08 DIAGNOSIS — Z4789 Encounter for other orthopedic aftercare: Secondary | ICD-10-CM | POA: Diagnosis not present

## 2020-07-03 DIAGNOSIS — I10 Essential (primary) hypertension: Secondary | ICD-10-CM | POA: Diagnosis not present

## 2020-07-27 DIAGNOSIS — H524 Presbyopia: Secondary | ICD-10-CM | POA: Diagnosis not present

## 2020-07-27 DIAGNOSIS — H25013 Cortical age-related cataract, bilateral: Secondary | ICD-10-CM | POA: Diagnosis not present

## 2020-07-27 DIAGNOSIS — H2513 Age-related nuclear cataract, bilateral: Secondary | ICD-10-CM | POA: Diagnosis not present

## 2020-07-27 DIAGNOSIS — H52203 Unspecified astigmatism, bilateral: Secondary | ICD-10-CM | POA: Diagnosis not present

## 2020-07-27 DIAGNOSIS — H5213 Myopia, bilateral: Secondary | ICD-10-CM | POA: Diagnosis not present

## 2020-08-02 DIAGNOSIS — Z299 Encounter for prophylactic measures, unspecified: Secondary | ICD-10-CM | POA: Diagnosis not present

## 2020-08-02 DIAGNOSIS — E7849 Other hyperlipidemia: Secondary | ICD-10-CM | POA: Diagnosis not present

## 2020-08-02 DIAGNOSIS — Z87891 Personal history of nicotine dependence: Secondary | ICD-10-CM | POA: Diagnosis not present

## 2020-08-02 DIAGNOSIS — Z6825 Body mass index (BMI) 25.0-25.9, adult: Secondary | ICD-10-CM | POA: Diagnosis not present

## 2020-08-02 DIAGNOSIS — I10 Essential (primary) hypertension: Secondary | ICD-10-CM | POA: Diagnosis not present

## 2020-08-02 DIAGNOSIS — I8390 Asymptomatic varicose veins of unspecified lower extremity: Secondary | ICD-10-CM | POA: Diagnosis not present

## 2020-08-02 DIAGNOSIS — E669 Obesity, unspecified: Secondary | ICD-10-CM | POA: Diagnosis not present

## 2020-08-03 DIAGNOSIS — I10 Essential (primary) hypertension: Secondary | ICD-10-CM | POA: Diagnosis not present

## 2020-08-28 DIAGNOSIS — Z87891 Personal history of nicotine dependence: Secondary | ICD-10-CM | POA: Diagnosis not present

## 2020-08-28 DIAGNOSIS — H9202 Otalgia, left ear: Secondary | ICD-10-CM | POA: Diagnosis not present

## 2020-08-28 DIAGNOSIS — J320 Chronic maxillary sinusitis: Secondary | ICD-10-CM | POA: Diagnosis not present

## 2020-08-28 DIAGNOSIS — Z299 Encounter for prophylactic measures, unspecified: Secondary | ICD-10-CM | POA: Diagnosis not present

## 2020-08-28 DIAGNOSIS — J029 Acute pharyngitis, unspecified: Secondary | ICD-10-CM | POA: Diagnosis not present

## 2020-08-31 DIAGNOSIS — M7542 Impingement syndrome of left shoulder: Secondary | ICD-10-CM | POA: Diagnosis not present

## 2020-08-31 DIAGNOSIS — M25512 Pain in left shoulder: Secondary | ICD-10-CM | POA: Diagnosis not present

## 2020-08-31 DIAGNOSIS — M25511 Pain in right shoulder: Secondary | ICD-10-CM | POA: Diagnosis not present

## 2020-09-01 DIAGNOSIS — I10 Essential (primary) hypertension: Secondary | ICD-10-CM | POA: Diagnosis not present

## 2020-09-14 DIAGNOSIS — Z299 Encounter for prophylactic measures, unspecified: Secondary | ICD-10-CM | POA: Diagnosis not present

## 2020-09-14 DIAGNOSIS — E7800 Pure hypercholesterolemia, unspecified: Secondary | ICD-10-CM | POA: Diagnosis not present

## 2020-09-14 DIAGNOSIS — J029 Acute pharyngitis, unspecified: Secondary | ICD-10-CM | POA: Diagnosis not present

## 2020-09-14 DIAGNOSIS — Z8616 Personal history of COVID-19: Secondary | ICD-10-CM | POA: Diagnosis not present

## 2020-09-14 DIAGNOSIS — M67449 Ganglion, unspecified hand: Secondary | ICD-10-CM | POA: Diagnosis not present

## 2020-09-14 DIAGNOSIS — I10 Essential (primary) hypertension: Secondary | ICD-10-CM | POA: Diagnosis not present

## 2020-10-03 DIAGNOSIS — I10 Essential (primary) hypertension: Secondary | ICD-10-CM | POA: Diagnosis not present

## 2020-10-24 DIAGNOSIS — I10 Essential (primary) hypertension: Secondary | ICD-10-CM | POA: Diagnosis not present

## 2020-11-02 DIAGNOSIS — I10 Essential (primary) hypertension: Secondary | ICD-10-CM | POA: Diagnosis not present

## 2020-11-02 DIAGNOSIS — E781 Pure hyperglyceridemia: Secondary | ICD-10-CM | POA: Diagnosis not present

## 2020-11-15 DIAGNOSIS — E7800 Pure hypercholesterolemia, unspecified: Secondary | ICD-10-CM | POA: Diagnosis not present

## 2020-11-15 DIAGNOSIS — I10 Essential (primary) hypertension: Secondary | ICD-10-CM | POA: Diagnosis not present

## 2020-11-15 DIAGNOSIS — I700 Atherosclerosis of aorta: Secondary | ICD-10-CM | POA: Diagnosis not present

## 2020-11-15 DIAGNOSIS — Z299 Encounter for prophylactic measures, unspecified: Secondary | ICD-10-CM | POA: Diagnosis not present

## 2020-11-15 DIAGNOSIS — H9202 Otalgia, left ear: Secondary | ICD-10-CM | POA: Diagnosis not present

## 2020-11-15 DIAGNOSIS — C142 Malignant neoplasm of Waldeyer's ring: Secondary | ICD-10-CM | POA: Diagnosis not present

## 2020-11-15 DIAGNOSIS — C099 Malignant neoplasm of tonsil, unspecified: Secondary | ICD-10-CM | POA: Diagnosis not present

## 2020-11-15 DIAGNOSIS — D3705 Neoplasm of uncertain behavior of pharynx: Secondary | ICD-10-CM | POA: Diagnosis not present

## 2020-11-17 ENCOUNTER — Other Ambulatory Visit (HOSPITAL_COMMUNITY): Payer: Self-pay | Admitting: Otolaryngology

## 2020-11-17 DIAGNOSIS — J358 Other chronic diseases of tonsils and adenoids: Secondary | ICD-10-CM

## 2020-11-21 ENCOUNTER — Ambulatory Visit (HOSPITAL_COMMUNITY)
Admission: RE | Admit: 2020-11-21 | Discharge: 2020-11-21 | Disposition: A | Discharge status: Home / Self Care | Payer: Medicare HMO | Source: Ambulatory Visit | Attending: Otolaryngology | Admitting: Otolaryngology

## 2020-11-21 ENCOUNTER — Other Ambulatory Visit: Payer: Self-pay

## 2020-11-21 DIAGNOSIS — H9202 Otalgia, left ear: Secondary | ICD-10-CM | POA: Diagnosis not present

## 2020-11-21 DIAGNOSIS — M47812 Spondylosis without myelopathy or radiculopathy, cervical region: Secondary | ICD-10-CM | POA: Diagnosis not present

## 2020-11-21 DIAGNOSIS — R221 Localized swelling, mass and lump, neck: Secondary | ICD-10-CM | POA: Diagnosis not present

## 2020-11-21 DIAGNOSIS — J358 Other chronic diseases of tonsils and adenoids: Secondary | ICD-10-CM | POA: Diagnosis not present

## 2020-11-21 LAB — POCT I-STAT CREATININE: Creatinine, Ser: 1.3 mg/dL — ABNORMAL HIGH (ref 0.61–1.24)

## 2020-11-21 MED ORDER — IOHEXOL 300 MG/ML  SOLN
75.0000 mL | Freq: Once | INTRAMUSCULAR | Status: AC | PRN
  Administered 2020-11-21: 75 mL via INTRAVENOUS

## 2020-11-28 ENCOUNTER — Other Ambulatory Visit (HOSPITAL_COMMUNITY): Payer: Self-pay | Admitting: Otolaryngology

## 2020-11-28 ENCOUNTER — Other Ambulatory Visit (HOSPITAL_COMMUNITY): Payer: Self-pay | Admitting: Surgery

## 2020-11-28 DIAGNOSIS — C099 Malignant neoplasm of tonsil, unspecified: Secondary | ICD-10-CM

## 2020-11-28 DIAGNOSIS — H9202 Otalgia, left ear: Secondary | ICD-10-CM | POA: Diagnosis not present

## 2020-11-29 ENCOUNTER — Inpatient Hospital Stay (HOSPITAL_COMMUNITY): Payer: Medicare HMO | Attending: Hematology and Oncology | Admitting: Hematology and Oncology

## 2020-11-29 ENCOUNTER — Encounter (HOSPITAL_COMMUNITY): Payer: Self-pay | Admitting: Hematology and Oncology

## 2020-11-29 ENCOUNTER — Other Ambulatory Visit: Payer: Self-pay

## 2020-11-29 VITALS — BP 115/61 | HR 65 | Temp 98.3°F | Resp 18 | Ht 67.0 in | Wt 146.7 lb

## 2020-11-29 DIAGNOSIS — Z905 Acquired absence of kidney: Secondary | ICD-10-CM | POA: Diagnosis not present

## 2020-11-29 DIAGNOSIS — H9193 Unspecified hearing loss, bilateral: Secondary | ICD-10-CM

## 2020-11-29 DIAGNOSIS — Z85828 Personal history of other malignant neoplasm of skin: Secondary | ICD-10-CM

## 2020-11-29 DIAGNOSIS — C099 Malignant neoplasm of tonsil, unspecified: Secondary | ICD-10-CM | POA: Insufficient documentation

## 2020-11-29 DIAGNOSIS — R634 Abnormal weight loss: Secondary | ICD-10-CM | POA: Diagnosis not present

## 2020-11-29 DIAGNOSIS — H9190 Unspecified hearing loss, unspecified ear: Secondary | ICD-10-CM | POA: Insufficient documentation

## 2020-11-29 DIAGNOSIS — Z87891 Personal history of nicotine dependence: Secondary | ICD-10-CM | POA: Diagnosis not present

## 2020-11-29 DIAGNOSIS — N189 Chronic kidney disease, unspecified: Secondary | ICD-10-CM | POA: Insufficient documentation

## 2020-11-29 DIAGNOSIS — Z85528 Personal history of other malignant neoplasm of kidney: Secondary | ICD-10-CM | POA: Insufficient documentation

## 2020-11-29 MED ORDER — LIDOCAINE-PRILOCAINE 2.5-2.5 % EX CREA: TOPICAL_CREAM | CUTANEOUS | 3 refills | Status: DC

## 2020-11-29 MED ORDER — ONDANSETRON HCL 8 MG PO TABS: 8.0000 mg | ORAL_TABLET | Freq: Two times a day (BID) | ORAL | 1 refills | Status: DC | PRN

## 2020-11-29 MED ORDER — PROCHLORPERAZINE MALEATE 10 MG PO TABS: 10.0000 mg | ORAL_TABLET | Freq: Four times a day (QID) | ORAL | 1 refills | Status: DC | PRN

## 2020-11-29 NOTE — Assessment & Plan Note (Signed)
He has remote history of partial nephrectomy and this is the reason why he has baseline renal function We discussed importance of adequate hydration during treatment and hence the rationale behind prophylactic feeding tube placement upfront before starting treatment

## 2020-11-29 NOTE — Assessment & Plan Note (Signed)
He has slight baseline hearing loss We will get baseline audiogram before starting him on treatment He is aware of 40% risk of ototoxicity with weekly cisplatin

## 2020-11-29 NOTE — Progress Notes (Signed)
I met with the patient today prior to his visit with Dr. Alvy Bimler. I introduced myself and explained my role in the patient's care. I provided my contact information and encouraged the patient to call with questions or concerns.

## 2020-11-29 NOTE — Progress Notes (Signed)
[facility] CONSULT NOTE  Patient Care Team: [name], MD as PCP - General (Internal Medicine) Brien Mates, RN as Oncology Nurse Navigator (Oncology)  ASSESSMENT & PLAN:  Tonsil cancer [facility]) He has very advanced disease on exam I explained to the patient why his disease is not resectable He is scheduled for PET CT scan tomorrow for staging We discussed the rationale behind concurrent chemotherapy and radiation therapy approach With his borderline kidney function, I recommend weekly cisplatin as chemo sensitizing agent rather than high-dose cisplatin every 3 weeks  We discussed the role of chemotherapy. The intent is of curative intent.  We discussed some of the risks, benefits, side-effects of cisplatin  Some of the short term side-effects included, though not limited to, including weight loss, life threatening infections, risk of allergic reactions, need for transfusions of blood products, nausea, vomiting, change in bowel habits, loss of hair, admission to hospital for various reasons, and risks of death.   Long term side-effects are also discussed including risks of infertility, permanent damage to nerve function, hearing loss, chronic fatigue, kidney damage with possibility needing hemodialysis, and rare secondary malignancy including bone marrow disorders.  The patient is aware that the response rates discussed earlier is not guaranteed.  After a long discussion, patient made an informed decision to proceed with the prescribed plan of care.   Patient education material was dispensed. I will schedule the following 1) chemo education class 2) port placement 3) feeding tube placement 4) dietitian consult 5) speech and language therapist assessment for swallowing 6) baseline audiogram 7) physical therapy evaluation and prevention against risk of lymphedema 8) dental clearance with special dentist specifically, Dr. Benson [name] 9) radiation oncology  consultation 10) return in 2 weeks to start weekly chemotherapy     History of kidney cancer He has remote history of partial nephrectomy and this is the reason why he has baseline renal function We discussed importance of adequate hydration during treatment and hence the rationale behind prophylactic feeding tube placement upfront before starting treatment  Weight loss I am concerned about his recent weight loss We discussed the importance of frequent small meals We discussed the importance of prophylactic feeding tube placement and upfront dietitian consult  History of skin cancer He will continue close follow-up with nephrologist  Hearing loss He has slight baseline hearing loss We will get baseline audiogram before starting him on treatment He is aware of 40% risk of ototoxicity with weekly cisplatin  Orders Placed This Encounter  Procedures   IR IMAGING GUIDED PORT INSERTION    Standing Status:   Future    Standing Expiration Date:   11/29/2021    Order Specific Question:   Reason for Exam (SYMPTOM  OR DIAGNOSIS REQUIRED)    Answer:   need port to start chemo    Order Specific Question:   Preferred Imaging Location?    Answer:   External   IR Gastrostomy Tube    Standing Status:   Future    Standing Expiration Date:   11/29/2021    Order Specific Question:   Reason for Exam (SYMPTOM  OR DIAGNOSIS REQUIRED)    Answer:   need feeding tube for treatment    Order Specific Question:   Preferred Imaging Location?    Answer:   External   Magnesium    Standing Status:   Standing    Number of Occurrences:   20    Standing Expiration Date:   11/29/2021   CBC with  Differential    Standing Status:   Standing    Number of Occurrences:   20    Standing Expiration Date:   11/29/2021   Comprehensive metabolic panel    Standing Status:   Standing    Number of Occurrences:   33    Standing Expiration Date:   11/29/2021   Magnesium    Standing Status:   Standing    Number of  Occurrences:   20    Standing Expiration Date:   11/29/2021   Ambulatory Referral to Speech Therapy  (specifically to Douglas [name])    Referral Priority:   Routine    Referral Type:   Speech Therapy    Referral Reason:   Specialty Services Required    Requested Specialty:   Speech Pathology    Number of Visits Requested:   1   Ambulatory Referral to Physical Therapy    Referral Priority:   Routine    Referral Type:   Physical Medicine    Referral Reason:   Specialty Services Required    Requested Specialty:   Physical Therapy    Number of Visits Requested:   1   Amb Referral to Nutrition and Diabetic Education (specifically to Douglas [name])    Referral Priority:   Routine    Referral Type:   Consultation    Referral Reason:   Specialty Services Required    Number of Visits Requested:   1   Ambulatory Referral to Dentistry (specifically to Dr. Enrique [name])    Referral Priority:   Routine    Referral Type:   Consultation    Referral Reason:   Specialty Services Required    Requested Specialty:   Dental General Practice    Number of Visits Requested:   1    The total time spent in the appointment was 60 minutes encounter with patients including review of chart and various tests results, discussions about plan of care and coordination of care plan   All questions were answered. The patient knows to call the clinic with any problems, questions or concerns. No barriers to learning was detected.  Douglas Lark, MD 7/27/20225:37 PM  CHIEF COMPLAINTS/PURPOSE OF CONSULTATION:  Left tonsil cancer, for further evaluation  HISTORY OF PRESENTING ILLNESS:  Douglas [name] 75 y.o. male is here because of recent diagnosis of tonsil cancer The patient have significant past smoking history He had remote history of kidney cancer status post partial nephrectomy 12 years ago.  He quit smoking then He also have history of skin cancer on his forehead and forearm status postresection Starting around May, he  complained of sore throat and left-sided hearing impairment He was treated with 2 courses of antibiotics and steroids by his primary care doctor without benefit Subsequently, he was referred to see ENT surgeon and had CT imaging which show tonsil mass Biopsy confirmed tonsil cancer, squamous cell carcinoma, p16 positive He has occasional alcohol intake Since diagnosis, he has mild weight loss He denies recent choking or dysphagia No changes in his voice He takes Advil to manage his throat pain He works at [employer] He has 1 son who lives close by I have reviewed his chart and materials related to his cancer extensively and collaborated history with the patient. Summary of oncologic history is as follows: Oncology History  Tonsil cancer ([place])  09/04/2020 Initial Diagnosis   He has been complaining of left sided ear pain, odynphagia and dysphagia   11/15/2020 Pathology Results   [phone] Tonsil biopsy:  squamous cell carcinoma P16 strongly positive   11/15/2020 Procedure   He underwent tonsil biopsy   11/22/2020 Imaging   CT neck 1. 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 There is preferential submucosal growth and the mass is indistinguishable from the left prevertebral space and lower left stylopharyngeaus. No convincing adenopathy. 2. Mixed density right upper lobe nodule, attention on anticipated PET.   11/29/2020 Initial Diagnosis   Tonsil cancer ([place])   11/29/2020 Cancer Staging   Staging form: Pharynx - HPV-Mediated Oropharynx, AJCC 8th Edition - Clinical stage from 11/29/2020: cT4, p16+ - Signed by Douglas Lark, MD on 11/29/2020  Stage prefix: Initial diagnosis    12/13/2020 -  Chemotherapy    Patient is on Treatment Plan: HEAD/NECK CISPLATIN Q7D         MEDICAL HISTORY:  Past Medical History:  Diagnosis Date   Arthritis    Cancer ([place])    Skin, and Kidney   Hypertension    Pre-diabetes    Skin cancer     SURGICAL HISTORY: Past  Surgical History:  Procedure Laterality Date   COLONOSCOPY     HERNIA REPAIR     LAPAROSCOPIC PARTIAL NEPHRECTOMY Left    PARTIAL NEPHRECTOMY Left 2009   REVERSE SHOULDER ARTHROPLASTY Right 09/10/2019   Procedure: REVERSE SHOULDER ARTHROPLASTY;  Surgeon: [name], MD;  Location: [facility];  Service: Orthopedics;  Laterality: Right;  interscalene block   SKIN SURGERY      SOCIAL HISTORY: Social History   Socioeconomic History   Marital status: Widowed    Spouse name: Not on file   Number of children: Not on file   Years of education: Not on file   Highest education level: Not on file  Occupational History    Employer: [employer]  Tobacco Use   Smoking status: Former    Packs/day: 1.00    Years: 20.00    Pack years: 20.00    Types: Cigarettes    Quit date: 2010    Years since quitting: 12.5   Smokeless tobacco: Never  Vaping Use   Vaping Use: Never used  Substance and Sexual Activity   Alcohol use: Yes    Alcohol/week: 1.0 standard drink    Types: 1 Cans of beer per week    Comment: occ   Drug use: Never   Sexual activity: Not on file  Other Topics Concern   Not on file  Social History Narrative   Not on file   Social Determinants of Health   Financial Resource Strain: Low Risk    Difficulty of Paying Living Expenses: Not hard at all  Food Insecurity: No Food Insecurity   Worried About Charity fundraiser in the Last Year: Never true   [place] in the Last Year: Never true  Transportation Needs: No Transportation Needs   Lack of Transportation (Medical): No   Lack of Transportation (Non-Medical): No  Physical Activity: Sufficiently Active   Days of Exercise per Week: 7 days   Minutes of Exercise per Session: 30 min  Stress: No Stress Concern Present   Feeling of Stress : Not at all  Social Connections: Socially Isolated   Frequency of Communication with Friends and Family: More than three times a week   Frequency of Social Gatherings with Friends  and Family: More than three times a week   Attends Religious Services: Never   Marine scientist or Organizations: No   Attends Archivist Meetings:  Never   Marital Status: Widowed  Human resources officer Violence: Not At Risk   Fear of Current or Ex-Partner: No   Emotionally Abused: No   Physically Abused: No   Sexually Abused: No    FAMILY HISTORY: Family History  Problem Relation Age of Onset   Diabetes Mother    Pulmonary embolism Father    Lung cancer Sister    Lung cancer Sister    Diabetes Brother    Lung cancer Brother     ALLERGIES:  has No Known Allergies.  MEDICATIONS:  Current Outpatient Medications  Medication Sig Dispense Refill   amLODipine (NORVASC) 10 MG tablet Take 10 mg by mouth daily.     atenolol (TENORMIN) 25 MG tablet Take 25 mg by mouth daily.     cholecalciferol (VITAMIN D3) 25 MCG (1000 UNIT) tablet Take 1,000 Units by mouth daily.     diclofenac (VOLTAREN) 75 MG EC tablet Take 75 mg by mouth daily.     lisinopril (ZESTRIL) 40 MG tablet Take 40 mg by mouth daily.     Menaquinone-7 (VITAMIN K2 PO) Take 1 tablet by mouth daily.     Multiple Vitamins-Minerals (MULTIVITAMIN WITH MINERALS) tablet Take 1 tablet by mouth daily.     simvastatin (ZOCOR) 20 MG tablet Take 20 mg by mouth daily.     tamsulosin (FLOMAX) 0.4 MG CAPS capsule Take 0.8 mg by mouth daily.     HYDROcodone-acetaminophen (NORCO) 5-325 MG tablet Take 1-2 tablets by mouth every 6 (six) hours as needed for moderate pain or severe pain. (Patient not taking: Reported on 11/29/2020) 30 tablet 0   lidocaine-prilocaine (EMLA) cream Apply to affected area once 30 g 3   methocarbamol (ROBAXIN) 500 MG tablet Take 1 tablet (500 mg total) by mouth every 8 (eight) hours as needed. (Patient not taking: Reported on 11/29/2020) 40 tablet 1   ondansetron (ZOFRAN) 8 MG tablet Take 1 tablet (8 mg total) by mouth 2 (two) times daily as needed. Start on the third day after cisplatin chemotherapy. 30  tablet 1   prochlorperazine (COMPAZINE) 10 MG tablet Take 1 tablet (10 mg total) by mouth every 6 (six) hours as needed (Nausea or vomiting). 30 tablet 1   No current facility-administered medications for this visit.    REVIEW OF SYSTEMS:   Constitutional: Denies fevers, chills or abnormal night sweats Eyes: Denies blurriness of vision, double vision or watery eyes Respiratory: Denies cough, dyspnea or wheezes Cardiovascular: Denies palpitation, chest discomfort or lower extremity swelling Gastrointestinal:  Denies nausea, heartburn or change in bowel habits Skin: Denies abnormal skin rashes Lymphatics: Denies new lymphadenopathy or easy bruising Neurological:Denies numbness, tingling or new weaknesses Behavioral/Psych: Mood is stable, no new changes  All other systems were reviewed with the patient and are negative.  PHYSICAL EXAMINATION: ECOG PERFORMANCE STATUS: 1 - Symptomatic but completely ambulatory  Vitals:   11/29/20 1433  BP: 115/61  Pulse: 65  Resp: 18  Temp: 98.3 F (36.8 C)  SpO2: 97%   Filed Weights   11/29/20 1433  Weight: 146 lb 11.2 oz (66.5 kg)    GENERAL:alert, no distress and comfortable SKIN: skin color, texture, turgor are normal, no rashes or significant lesions EYES: normal, conjunctiva are pink and non-injected, sclera clear OROPHARYNX: He has large tonsil mass consistent with cancer.  Poor dentition is noted NECK: supple, thyroid normal size, non-tender, without nodularity LYMPH: No obvious palpable lymphadenopathy in his neck LUNGS: clear to auscultation and percussion with normal breathing effort HEART: regular rate &  rhythm and no murmurs and no lower extremity edema ABDOMEN:abdomen soft, non-tender and normal bowel sounds Musculoskeletal:no cyanosis of digits and no clubbing  PSYCH: alert & oriented x 3 with fluent speech NEURO: no focal motor/sensory deficits  LABORATORY DATA:  I have reviewed the data as listed Lab Results  Component  Value Date   WBC 8.2 09/03/2019   HGB 11.4 (L) 09/11/2019   HCT 35.0 (L) 09/11/2019   MCV 98.8 09/03/2019   PLT 246 09/03/2019   Recent Labs    11/21/20 1509  CREATININE 1.30*    RADIOGRAPHIC STUDIES: I have reviewed imaging study with the patient I have personally reviewed the radiological images as listed and agreed with the findings in the report. CT SOFT TISSUE NECK W CONTRAST  Result Date: 11/22/2020 CLINICAL DATA:  Left tonsil mass with swelling and left ear pain for 3 months. Biopsy last week with results currently not known to me EXAM: CT NECK WITH CONTRAST TECHNIQUE: Multidetector CT imaging of the neck was performed using the standard protocol following the bolus administration of intravenous contrast. CONTRAST:  42m OMNIPAQUE IOHEXOL 300 MG/ML  SOLN COMPARISON:  None. FINDINGS: Pharynx and larynx: 4.1 cm mass centered at the left tonsillar fossa. The deep margin of the mass is intimately associated with the lower styloid pharyngeus. No fat plane is seen between the upper mass and the anterior left lateral mass of C2. No masticator space invasion or visible carotid space invasion. There is close proximity but no definite contact of the left retromolar trigone. Salivary glands: Negative Thyroid: Negative Lymph nodes: No definite nodal enlargement or heterogeneity. Nodular appearance in the deep left submandibular region is favored to be a projection of the submandibular gland. Vascular: Atheromatous calcification. 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 Upper chest: Ground-glass nodule with central solid nodule in the right upper lobe. The solid component measures 5 mm. IMPRESSION: 1. 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 There is preferential submucosal growth and the mass is indistinguishable from the left  prevertebral space and lower left stylopharyngeaus. No convincing adenopathy. 2. Mixed density right upper lobe nodule, attention on anticipated PET. Electronically Signed   By: JMonte FantasiaM.D.   On: 11/22/2020 11:47

## 2020-11-29 NOTE — Progress Notes (Signed)
START ON PATHWAY REGIMEN - Head and Neck     A cycle is every 7 days:     Cisplatin   **Always confirm dose/schedule in your pharmacy ordering system**  Patient Characteristics: Oropharynx, HPV Positive, Preoperative or Nonsurgical Candidate (Clinical Staging), cT0-4, cN1-3 or cT3-4, cN0 Disease Classification: Oropharynx HPV Status: Positive (+) Therapeutic Status: Preoperative or Nonsurgical Candidate (Clinical Staging) AJCC T Category: cT3 AJCC 8 Stage Grouping: II AJCC N Category: cN0 AJCC M Category: cM0 Intent of Therapy: Curative Intent, Discussed with Patient

## 2020-11-29 NOTE — Assessment & Plan Note (Signed)
I am concerned about his recent weight loss We discussed the importance of frequent small meals We discussed the importance of prophylactic feeding tube placement and upfront dietitian consult

## 2020-11-29 NOTE — Assessment & Plan Note (Signed)
He has very advanced disease on exam I explained to the patient why his disease is not resectable He is scheduled for PET CT scan tomorrow for staging We discussed the rationale behind concurrent chemotherapy and radiation therapy approach With his borderline kidney function, I recommend weekly cisplatin as chemo sensitizing agent rather than high-dose cisplatin every 3 weeks  We discussed the role of chemotherapy. The intent is of curative intent.  We discussed some of the risks, benefits, side-effects of cisplatin  Some of the short term side-effects included, though not limited to, including weight loss, life threatening infections, risk of allergic reactions, need for transfusions of blood products, nausea, vomiting, change in bowel habits, loss of hair, admission to hospital for various reasons, and risks of death.   Long term side-effects are also discussed including risks of infertility, permanent damage to nerve function, hearing loss, chronic fatigue, kidney damage with possibility needing hemodialysis, and rare secondary malignancy including bone marrow disorders.  The patient is aware that the response rates discussed earlier is not guaranteed.  After a long discussion, patient made an informed decision to proceed with the prescribed plan of care.   Patient education material was dispensed. I will schedule the following 1) chemo education class 2) port placement 3) feeding tube placement 4) dietitian consult 5) speech and language therapist assessment for swallowing 6) baseline audiogram 7) physical therapy evaluation and prevention against risk of lymphedema 8) dental clearance with special dentist specifically, Dr. Benson Norway 9) radiation oncology consultation 10) return in 2 weeks to start weekly chemotherapy

## 2020-11-29 NOTE — Assessment & Plan Note (Signed)
He will continue close follow-up with nephrologist

## 2020-11-30 ENCOUNTER — Encounter (HOSPITAL_COMMUNITY): Payer: Self-pay | Admitting: Lab

## 2020-11-30 ENCOUNTER — Ambulatory Visit (HOSPITAL_COMMUNITY)
Admission: RE | Admit: 2020-11-30 | Discharge: 2020-11-30 | Disposition: A | Discharge status: Home / Self Care | Payer: Medicare HMO | Source: Ambulatory Visit | Attending: Otolaryngology | Admitting: Otolaryngology

## 2020-11-30 ENCOUNTER — Other Ambulatory Visit (HOSPITAL_COMMUNITY): Payer: Self-pay

## 2020-11-30 ENCOUNTER — Encounter (HOSPITAL_COMMUNITY): Payer: Self-pay

## 2020-11-30 DIAGNOSIS — C099 Malignant neoplasm of tonsil, unspecified: Secondary | ICD-10-CM

## 2020-11-30 NOTE — Progress Notes (Unsigned)
Referral to [facility].  Records faxed on 7/28

## 2020-12-01 ENCOUNTER — Ambulatory Visit (HOSPITAL_COMMUNITY): Payer: Medicare HMO

## 2020-12-01 DIAGNOSIS — I10 Essential (primary) hypertension: Secondary | ICD-10-CM | POA: Diagnosis not present

## 2020-12-04 ENCOUNTER — Other Ambulatory Visit: Payer: Self-pay

## 2020-12-04 ENCOUNTER — Inpatient Hospital Stay (HOSPITAL_COMMUNITY): Payer: Medicare HMO | Attending: Hematology and Oncology | Admitting: Dietician

## 2020-12-04 MED ORDER — OSMOLITE 1.5 CAL PO LIQD: ORAL | 0 refills | Status: DC

## 2020-12-04 NOTE — Progress Notes (Addendum)
Nutrition Assessment   ASSESSMENT: 75 year old male with newly diagnosed tonsil cancer. Plans to start concurrent chemoradiation therapy with weekly cisplatin on August 10. Patient is having PEG placed August 8.   Past medical history kidney cancer s/p partial nephrectomy  Met with patient in clinic. He reports good appetite, having some swallowing difficulties and throat soreness. Patient says he stopped eating sweets and candy about 3 weeks ago. He usually eats 3 times daily. For breakfast he usually has a piece of home made white wheat toast with blackberry jam, yesterday he had banana and peach for lunch, salad with homegrown tomatoes, humus and chips for dinner. He drinks CIB with 2% milk regularly. He is drinking 3 cups of coffee, 2-3 glasses of crystal light lemonade, and 2-3 (16.9 oz) bottles of water daily. Patient is active, he has 3 dogs which he walks, recently started getting back in the gym after surgery for his torn rotator cuff. Patient reports having regular bowel movements daily.    Medications: Compazine, Zofran, MVI, D3, K2, zinc  Labs: 7/19 Cr 1.30  Anthropometrics:   Height: 5'7" Weight: 66.5 kg  UBW: 150-155 lb (per pt) BMI: 22.98   Estimated Energy Needs  Kcals: 2000-2200 Protein: 93-106 Fluid: 2.1 L   NUTRITION DIAGNOSIS: Predicted suboptimal intake related to cancer and associated treatment side effects as evidenced pending radiation therapy for tonsil cancer affecting ability to swallow foods.    INTERVENTION:  Educated on the importance of adequate calorie and protein energy intake to maintain weights, strength, and nutrition  Discussed eating high protein snacks in between meals as able Encouraged eating soft, moist high protein foods as able - handout with recipes provided Discussed ways to alter texture of foods Continue drinking CIB daily, suggested switching to full fat milk for added calories Samples of Ensure/Boost coupons provided PEG care,  tube feed teaching provided  Complimentary case of Osmolite 1.5 and PEG kit provided today (box of split gauze, syringes, tape, mesh underwear) Will contact [facility] for delivery of formula and supplies Contact information provided   Patient encouraged to eat regular diet as tolerated. He will start with 1 feeding/day. Patient will increase to goal of 6 cartons/day as needed with progression of dysphagia/ decreased intake of foods.    Osmolite 1.5 - 6 cartons split over four feedings/day. Flush tube with 60 ml water before and after each feeding. Drink by mouth or give via tube additional 2.5 cups fluids/day. This provides 2130 kcal, 89 g protein, 2159 ml total water/day. Meets 100% of needs.    MONITORING, EVALUATION, GOAL: Patient will tolerate increased calories and protein to minimize weight loss   Next Visit: f/u via telephone Monday August 15

## 2020-12-04 NOTE — Addendum Note (Signed)
Addended by: Morrell Riddle on: 12/04/2020 01:01 PM   Modules accepted: Orders

## 2020-12-05 ENCOUNTER — Ambulatory Visit: Payer: Medicare HMO | Attending: Hematology and Oncology | Admitting: Physical Therapy

## 2020-12-05 DIAGNOSIS — R293 Abnormal posture: Secondary | ICD-10-CM | POA: Insufficient documentation

## 2020-12-05 DIAGNOSIS — D490 Neoplasm of unspecified behavior of digestive system: Secondary | ICD-10-CM | POA: Insufficient documentation

## 2020-12-05 NOTE — Therapy (Signed)
[place], [place], [address] Phone: [phone]   Fax:  [phone]  Physical Therapy Evaluation  Patient Details  Name: Douglas [name] MRN: [id number] Date of Birth: Feb 09, 1946 Referring Provider (PT): Dr Alvy Bimler   Encounter Date: 12/05/2020   PT End of Session - 12/05/20 1658     Visit Number 1    Number of Visits 2    Date for PT Re-Evaluation 03/16/21   if needed post treatment   PT Start Time 1600    PT Stop Time 1645    PT Time Calculation (min) 45 min    Activity Tolerance Patient tolerated treatment well    Behavior During Therapy [facility] for tasks assessed/performed             Past Medical History:  Diagnosis Date   Arthritis    Cancer ([place])    Skin, and Kidney   Hypertension    Pre-diabetes    Skin cancer     Past Surgical History:  Procedure Laterality Date   COLONOSCOPY     HERNIA REPAIR     LAPAROSCOPIC PARTIAL NEPHRECTOMY Left    PARTIAL NEPHRECTOMY Left 2009   REVERSE SHOULDER ARTHROPLASTY Right 09/10/2019   Procedure: REVERSE SHOULDER ARTHROPLASTY;  Surgeon: [name], MD;  Location: [facility];  Service: Orthopedics;  Laterality: Right;  interscalene block   SKIN SURGERY      There were no vitals filed for this visit.    Subjective Assessment - 12/05/20 1615     Subjective Pt is here to learn what he know before his radiation/chemo starts. He has a hard time swallowing since April or May    Pertinent History Past history of right total shoulder replacement May 2020 and developed a displaced acromion stress shoulder  so had to have another shoulder surgery.  History of kidney cancer in 2009 and had a partial nephrectomy. Pt has sever cervical joint degeneration    Patient Stated Goals to learn what he needs to know    Currently in Pain? Yes   burning   Pain Score 3     Pain Location Throat    Pain Descriptors / Indicators Burning    Pain Type Acute pain    Pain  Radiating Towards 3 or 4 months.    Pain Onset More than a month ago    Pain Frequency Intermittent    Aggravating Factors  swallowing                [facility] PT Assessment - 12/05/20 0001       Assessment   Medical Diagnosis tonsil cancer    Referring Provider (PT) Dr Alvy Bimler    Onset Date/Surgical Date 11/29/20    Hand Dominance Right;Left      Restrictions   Weight Bearing Restrictions No      Balance Screen   Has the patient fallen in the past 6 months No    Has the patient had a decrease in activity level because of a fear of falling?  No    Is the patient reluctant to leave their home because of a fear of falling?  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      Functional Tests   Functional tests Sit to Stand      Sit to Stand   Comments 13 sit to stand in 30 seconds. Pt states it was "easy"      Posture/Postural Control   Posture/Postural Control Postural limitations    Postural Limitations Rounded Shoulders;Forward head      ROM / Strength   AROM / PROM / Strength AROM;Strength      AROM   Overall AROM  Within functional limits for tasks performed    AROM Assessment Site Shoulder;Cervical    Right/Left Shoulder Right;Left    Right Shoulder Flexion 120 Degrees    Right Shoulder ABduction 110 Degrees    Right Shoulder External Rotation 80 Degrees    Left Shoulder Flexion 160 Degrees    Left Shoulder ABduction 150 Degrees    Left Shoulder External Rotation 90  Degrees    Cervical Flexion WFL    Cervical Extension limimted to 25%    Cervical - Right Side Bend 50%    Cervical - Left Side Bend 50%    Cervical - Right Rotation 50%    Cervical - Left Rotation 50%      Strength   Overall Strength Within functional limits for tasks performed               LYMPHEDEMA/ONCOLOGY QUESTIONNAIRE - 12/05/20 0001       Type   Cancer Type tonsil cancer      Lymphedema Assessments   Lymphedema Assessments Head and Neck      Head and Neck   4 cm superior to sternal notch around neck 37.1 cm    6 cm superior to sternal notch around neck 37 cm    8 cm superior to sternal notch around neck 38 cm                     Objective measurements completed on examination: See above findings.               PT Education - 12/05/20 1657     Education Details cervical and shoulder ROM, postural exercises, walking program    Person(s) Educated Patient    Methods Explanation;Handout    Comprehension Verbalized understanding                     Head and Neck Clinic Goals - 12/05/20 1710       Patient will be able to verbalize understanding of a home exercise program for cervical range of motion, posture, and walking.          Time 1    Period Days    Status Achieved      Patient will be able to verbalize understanding of proper sitting and standing posture.          Period Days    Status Achieved      Patient will be able to verbalize understanding of lymphedema risk and availability of treatment for this condition.          Time 1    Period Days    Status Achieved  Plan - 12/05/20 1659     Clinical Impression Statement Pt comes in for pre treatment assessmend for tonsil cancer.  He has lmited neck ROM and right shoulder movement from previous orthopedic conditions, but otherwise has good functional mobility and activity tolerance. He was educated about what to watch for regarding post  treatment lymphedema and the importance of continuing to exercise during treatment.    Personal Factors and Comorbidities Comorbidity 3+    Comorbidities past right shoulder surgery, neck degerative arthritis, tonsil cancer    Stability/Clinical Decision Making Stable/Uncomplicated    Clinical Decision Making Low    Rehab Potential Good    PT Frequency One time visit    PT Treatment/Interventions Patient/family education    PT Next Visit Plan reassess and recertify    PT Home Exercise Plan walking, neck and shoulder ROM    Consulted and Agree with Plan of Care Patient             Patient will benefit from skilled therapeutic intervention in order to improve the following deficits and impairments:  Decreased knowledge of precautions, Decreased knowledge of use of DME, Decreased range of motion, Postural dysfunction  Visit Diagnosis: Abnormal posture - Plan: PT plan of care cert/re-cert  Tonsil neoplasm - Plan: PT plan of care cert/re-cert     Problem List Patient Active Problem List   Diagnosis Date Noted   Tonsil cancer ([place]) 11/29/2020   History of kidney cancer 11/29/2020   Weight loss 11/29/2020   History of skin cancer 11/29/2020   Hearing loss 11/29/2020   S/P shoulder replacement, 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 PT  Norwood Levo 12/05/2020, 5:12 PM  [place], [place], [address] Phone: [phone]   Fax:  [phone]  Name: ENZIO CATERINO MRN: [id number] Date of Birth: 05/27/1945

## 2020-12-06 DIAGNOSIS — H903 Sensorineural hearing loss, bilateral: Secondary | ICD-10-CM | POA: Diagnosis not present

## 2020-12-07 ENCOUNTER — Other Ambulatory Visit: Payer: Self-pay

## 2020-12-07 ENCOUNTER — Telehealth (HOSPITAL_COMMUNITY): Payer: Self-pay

## 2020-12-07 ENCOUNTER — Ambulatory Visit (HOSPITAL_COMMUNITY)
Admission: RE | Admit: 2020-12-07 | Discharge: 2020-12-07 | Disposition: A | Discharge status: Home / Self Care | Payer: Medicare HMO | Source: Ambulatory Visit | Attending: Otolaryngology | Admitting: Otolaryngology

## 2020-12-07 DIAGNOSIS — R918 Other nonspecific abnormal finding of lung field: Secondary | ICD-10-CM | POA: Insufficient documentation

## 2020-12-07 DIAGNOSIS — I2510 Atherosclerotic heart disease of native coronary artery without angina pectoris: Secondary | ICD-10-CM | POA: Diagnosis not present

## 2020-12-07 DIAGNOSIS — M47816 Spondylosis without myelopathy or radiculopathy, lumbar region: Secondary | ICD-10-CM | POA: Diagnosis not present

## 2020-12-07 DIAGNOSIS — C099 Malignant neoplasm of tonsil, unspecified: Secondary | ICD-10-CM | POA: Diagnosis not present

## 2020-12-07 DIAGNOSIS — I700 Atherosclerosis of aorta: Secondary | ICD-10-CM | POA: Diagnosis not present

## 2020-12-07 MED ORDER — FLUDEOXYGLUCOSE F - 18 (FDG) INJECTION
6.7400 | Freq: Once | INTRAVENOUS | Status: AC | PRN
  Administered 2020-12-07: 6.74 via INTRAVENOUS

## 2020-12-07 NOTE — Telephone Encounter (Signed)
-----   Message from Markus Daft, MD sent at 12/07/2020  3:32 PM EDT ----- Regarding: RE: peg/port placement Ok for port and gtube placement.  [place] ----- Message ----- From: Danielle Dess Sent: 12/07/2020   3:14 PM EDT To: Ir Procedure Requests Subject: peg/port placement                             Procedure: Peg/port placement  Dx: tonsil cancer  Ordering: Dr. [name] [phone]  Imaging: NM Pet done 12/07/20  Please review.  Thanks, Lia Foyer

## 2020-12-08 ENCOUNTER — Other Ambulatory Visit: Payer: Self-pay | Admitting: Student

## 2020-12-11 ENCOUNTER — Encounter (HOSPITAL_COMMUNITY): Payer: Self-pay

## 2020-12-11 ENCOUNTER — Ambulatory Visit (HOSPITAL_COMMUNITY)
Admission: RE | Admit: 2020-12-11 | Discharge: 2020-12-11 | Disposition: A | Discharge status: Home / Self Care | Payer: Medicare HMO | Source: Ambulatory Visit | Attending: Hematology and Oncology | Admitting: Hematology and Oncology

## 2020-12-11 ENCOUNTER — Other Ambulatory Visit: Payer: Self-pay

## 2020-12-11 DIAGNOSIS — R1310 Dysphagia, unspecified: Secondary | ICD-10-CM | POA: Insufficient documentation

## 2020-12-11 DIAGNOSIS — C099 Malignant neoplasm of tonsil, unspecified: Secondary | ICD-10-CM

## 2020-12-11 DIAGNOSIS — C760 Malignant neoplasm of head, face and neck: Secondary | ICD-10-CM | POA: Diagnosis not present

## 2020-12-11 DIAGNOSIS — Z87891 Personal history of nicotine dependence: Secondary | ICD-10-CM | POA: Diagnosis not present

## 2020-12-11 DIAGNOSIS — Z79899 Other long term (current) drug therapy: Secondary | ICD-10-CM | POA: Insufficient documentation

## 2020-12-11 DIAGNOSIS — Z431 Encounter for attention to gastrostomy: Secondary | ICD-10-CM | POA: Diagnosis not present

## 2020-12-11 DIAGNOSIS — Z452 Encounter for adjustment and management of vascular access device: Secondary | ICD-10-CM | POA: Diagnosis not present

## 2020-12-11 HISTORY — PX: IR IMAGING GUIDED PORT INSERTION: IMG5740

## 2020-12-11 HISTORY — PX: IR GASTROSTOMY TUBE MOD SED: IMG625

## 2020-12-11 LAB — PROTIME-INR
INR: 1 (ref 0.8–1.2)
Prothrombin Time: 13.4 seconds (ref 11.4–15.2)

## 2020-12-11 LAB — CBC
HCT: 42.9 % (ref 39.0–52.0)
Hemoglobin: 14.4 g/dL (ref 13.0–17.0)
MCH: 32.6 pg (ref 26.0–34.0)
MCHC: 33.6 g/dL (ref 30.0–36.0)
MCV: 97.1 fL (ref 80.0–100.0)
Platelets: 261 10*3/uL (ref 150–400)
RBC: 4.42 MIL/uL (ref 4.22–5.81)
RDW: 13.6 % (ref 11.5–15.5)
WBC: 9.1 10*3/uL (ref 4.0–10.5)
nRBC: 0 % (ref 0.0–0.2)

## 2020-12-11 MED ORDER — LIDOCAINE HCL 1 % IJ SOLN
INTRAMUSCULAR | Status: DC | PRN
  Administered 2020-12-11: 10 mL

## 2020-12-11 MED ORDER — BACITRACIN-NEOMYCIN-POLYMYXIN 400-5-5000 EX OINT
1.0000 "application " | TOPICAL_OINTMENT | Freq: Every day | CUTANEOUS | Status: DC
  Filled 2020-12-11: qty 1

## 2020-12-11 MED ORDER — SODIUM CHLORIDE 0.9 % IV SOLN
INTRAVENOUS | Status: DC
Start: 2020-12-11 — End: 2020-12-12

## 2020-12-11 MED ORDER — FENTANYL CITRATE (PF) 100 MCG/2ML IJ SOLN
INTRAMUSCULAR | Status: AC | PRN
  Administered 2020-12-11 (×2): 50 ug via INTRAVENOUS

## 2020-12-11 MED ORDER — FENTANYL CITRATE (PF) 100 MCG/2ML IJ SOLN
INTRAMUSCULAR | Status: AC
  Filled 2020-12-11: qty 2

## 2020-12-11 MED ORDER — HEPARIN SODIUM (PORCINE) 1000 UNIT/ML IJ SOLN
INTRAMUSCULAR | Status: AC | PRN
  Administered 2020-12-11: 500 [IU] via INTRAVENOUS

## 2020-12-11 MED ORDER — LIDOCAINE HCL 1 % IJ SOLN
INTRAMUSCULAR | Status: AC
  Filled 2020-12-11: qty 20

## 2020-12-11 MED ORDER — HEPARIN SOD (PORK) LOCK FLUSH 100 UNIT/ML IV SOLN
INTRAVENOUS | Status: AC
  Filled 2020-12-11: qty 5

## 2020-12-11 MED ORDER — MIDAZOLAM HCL 2 MG/2ML IJ SOLN
INTRAMUSCULAR | Status: AC | PRN
  Administered 2020-12-11 (×3): 1 mg via INTRAVENOUS

## 2020-12-11 MED ORDER — MIDAZOLAM HCL 2 MG/2ML IJ SOLN
INTRAMUSCULAR | Status: AC
  Filled 2020-12-11: qty 2

## 2020-12-11 MED ORDER — CEFAZOLIN SODIUM-DEXTROSE 2-4 GM/100ML-% IV SOLN: 2.0000 g | INTRAVENOUS | Status: AC

## 2020-12-11 MED ORDER — CEFAZOLIN SODIUM-DEXTROSE 2-4 GM/100ML-% IV SOLN
INTRAVENOUS | Status: AC
  Administered 2020-12-11: 2 g via INTRAVENOUS
  Filled 2020-12-11: qty 100

## 2020-12-11 NOTE — Procedures (Signed)
Interventional Radiology Procedure Note  Procedure: Placement of a right IJ approach single lumen PowerPort.  Tip is positioned at the superior cavoatrial junction and catheter is ready for immediate use.  Complications: None Recommendations:  - Ok to shower tomorrow - Do not submerge for 7 days - Routine line care  - proceed with gtube now  Signed,  Dulcy Fanny. Earleen Newport, DO

## 2020-12-11 NOTE — H&P (Signed)
Chief Complaint: Patient was seen in consultation today for percutaneous gastric tube placement and Port a cath placement at the request of [name]  Referring Physician(s): [name]  Supervising Physician: Corrie Mckusick  Patient Status: [name] - Out-pt  History of Present Illness: Douglas [name] is a 75 y.o. male   Hx Renal ca- partial nephrectomy 12 yrs ago Skin cancers Newly dx Head/Neck Cancer Tonsil cancer Follows with Dr Alvy Bimler Advanced disease per notes- non resectable  PET: 12/07/20:  IMPRESSION: 1. 4.1 cm left palatine tonsillar mass has a maximum SUV of 16.1, compatible with malignancy. 2. A 0.8 cm left level III lymph node has maximum SUV of 4.4, compatible with malignant involvement. 3. A somewhat vertically elongated 0 point 9 by 1.0 cm right lower lobe nodule along the azygoesophageal recess has accentuated metabolic activity with maximum SUV 5.8. This has sub solid components was not visible on the chest CT of 04/24/2020. Possibilities include unusual morphology of metastatic disease versus an inflammatory process. 4. There is also a ground-glass opacity laterally in the right upper lobe which is likely inflammatory and with maximum SUV of only 1.4. 5. 0.6 cm right middle lobe nodule is not hypermetabolic and is nonspecific. Surveillance suggested.  To start chemotherapy 12/13/20 Scheduled now for port a cath placement and percutaneous gastric tube placement   Past Medical History:  Diagnosis Date   Arthritis    Cancer ([place])    Skin, and Kidney   Hypertension    Pre-diabetes    Skin cancer     Past Surgical History:  Procedure Laterality Date   COLONOSCOPY     HERNIA REPAIR     LAPAROSCOPIC PARTIAL NEPHRECTOMY Left    PARTIAL NEPHRECTOMY Left 2009   REVERSE SHOULDER ARTHROPLASTY Right 09/10/2019   Procedure: REVERSE SHOULDER ARTHROPLASTY;  Surgeon: [name], MD;  Location: [facility];  Service: Orthopedics;  Laterality: Right;  interscalene  block   SKIN SURGERY      Allergies: Patient has no known allergies.  Medications: Prior to Admission medications   Medication Sig Start Date End Date Taking? Authorizing Provider  amLODipine (NORVASC) 10 MG tablet Take 10 mg by mouth daily.   Yes [provider]  atenolol (TENORMIN) 25 MG tablet Take 25 mg by mouth daily.   Yes [provider]  cholecalciferol (VITAMIN D3) 25 MCG (1000 UNIT) tablet Take 1,000 Units by mouth daily.   Yes [provider]  diclofenac (VOLTAREN) 75 MG EC tablet Take 75 mg by mouth 2 (two) times daily.   Yes [provider]  lisinopril (ZESTRIL) 40 MG tablet Take 40 mg by mouth daily.   Yes [provider]  Menaquinone-7 (VITAMIN K2 PO) Take 1 tablet by mouth daily.   Yes [provider]  Multiple Vitamins-Minerals (MULTIVITAMIN WITH MINERALS) tablet Take 1 tablet by mouth daily.   Yes [provider]  simvastatin (ZOCOR) 20 MG tablet Take 20 mg by mouth daily.   Yes [provider]  tamsulosin (FLOMAX) 0.4 MG CAPS capsule Take 0.8 mg by mouth daily.   Yes [provider]  zinc gluconate 50 MG tablet Take 50 mg by mouth daily.   Yes [provider]  lidocaine-prilocaine (EMLA) cream Apply to affected area once 11/29/20   [name], MD  Nutritional Supplements (FEEDING SUPPLEMENT, OSMOLITE 1.5 CAL,) LIQD Give 6 cartons Osmolite 1.5 via tube split over 4 feedings/day. Flush tube with 60 ml water before and after each bolus feeding. Drink by mouth or give  via tube additional 2.5 c fluids/day. This provides 2130 kcal, 89 grams protein, 2159 ml total water. Meets 100% needs. 12/04/20   Derek Bernardo, MD  ondansetron (ZOFRAN) 8 MG tablet Take 1 tablet (8 mg total) by mouth 2 (two) times daily as needed. Start on the third day after cisplatin chemotherapy. 11/29/20   [name], MD  prochlorperazine (COMPAZINE) 10 MG tablet Take 1 tablet (10 mg total) by mouth every 6 (six)  hours as needed (Nausea or vomiting). 11/29/20   [name], MD     Family History  Problem Relation Age of Onset   Diabetes Mother    Pulmonary embolism Father    Lung cancer Sister    Lung cancer Sister    Diabetes Brother    Lung cancer Brother     Social History   Socioeconomic History   Marital status: Widowed    Spouse name: Not on file   Number of children: Not on file   Years of education: Not on file   Highest education level: Not on file  Occupational History    Employer: [employer]  Tobacco Use   Smoking status: Former    Packs/day: 1.00    Years: 20.00    Pack years: 20.00    Types: Cigarettes    Quit date: 2010    Years since quitting: 12.6   Smokeless tobacco: Never  Vaping Use   Vaping Use: Never used  Substance and Sexual Activity   Alcohol use: Yes    Alcohol/week: 1.0 standard drink    Types: 1 Cans of beer per week    Comment: occ   Drug use: Never   Sexual activity: Not on file  Other Topics Concern   Not on file  Social History Narrative   Not on file   Social Determinants of Health   Financial Resource Strain: Low Risk    Difficulty of Paying Living Expenses: Not hard at all  Food Insecurity: No Food Insecurity   Worried About Charity fundraiser in the Last Year: Never true   [place] in the Last Year: Never true  Transportation Needs: No Transportation Needs   Lack of Transportation (Medical): No   Lack of Transportation (Non-Medical): No  Physical Activity: Sufficiently Active   Days of Exercise per Week: 7 days   Minutes of Exercise per Session: 30 min  Stress: No Stress Concern Present   Feeling of Stress : Not at all  Social Connections: Socially Isolated   Frequency of Communication with Friends and Family: More than three times a week   Frequency of Social Gatherings with Friends and Family: More than three times a week   Attends Religious Services: Never   Marine scientist or Organizations: No   Attends English as a second language teacher Meetings: Never   Marital Status: Widowed     Review of Systems: A 12 point ROS discussed and pertinent positives are indicated in the HPI above.  All other systems are negative.  Review of Systems  Constitutional:  Positive for appetite change. Negative for fever.  HENT:  Positive for sore throat.   Respiratory:  Negative for cough and shortness of breath.   Gastrointestinal:  Negative for abdominal pain.  Psychiatric/Behavioral:  Negative for behavioral problems and confusion.    Vital Signs: BP (!) 146/73   Pulse 64   Temp 98.4 F (36.9 C) (Oral)   Resp 17   Ht '5\' 7"'$  (1.702 m)   Wt 150  lb (68 kg)   SpO2 98%   BMI 23.49 kg/m   Physical Exam Vitals reviewed.  HENT:     Mouth/Throat:     Mouth: Mucous membranes are moist.  Cardiovascular:     Rate and Rhythm: Normal rate and regular rhythm.     Heart sounds: Normal heart sounds.  Pulmonary:     Effort: Pulmonary effort is normal.     Breath sounds: Normal breath sounds.  Abdominal:     Palpations: Abdomen is soft.     Tenderness: There is no abdominal tenderness.  Musculoskeletal:        General: Normal range of motion.  Skin:    General: Skin is warm.  Neurological:     Mental Status: He is alert and oriented to person, place, and time.  Psychiatric:        Behavior: Behavior normal.    Imaging: CT SOFT TISSUE NECK W CONTRAST  Result Date: 11/22/2020 CLINICAL DATA:  Left tonsil mass with swelling and left ear pain for 3 months. Biopsy last week with results currently not known to me EXAM: CT NECK WITH CONTRAST TECHNIQUE: Multidetector CT imaging of the neck was performed using the standard protocol following the bolus administration of intravenous contrast. CONTRAST:  29m OMNIPAQUE IOHEXOL 300 MG/ML  SOLN COMPARISON:  None. FINDINGS: Pharynx and larynx: 4.1 cm mass centered at the left tonsillar fossa. The deep margin of the mass is intimately associated with the lower styloid pharyngeus. No fat  plane is seen between the upper mass and the anterior left lateral mass of C2. No masticator space invasion or visible carotid space invasion. There is close proximity but no definite contact of the left retromolar trigone. Salivary glands: Negative Thyroid: Negative Lymph nodes: No definite nodal enlargement or heterogeneity. Nodular appearance in the deep left submandibular region is favored to be a projection of the submandibular gland. Vascular: Atheromatous calcification. 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 Upper chest: Ground-glass nodule with central solid nodule in the right upper lobe. The solid component measures 5 mm. IMPRESSION: 1. 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 There is preferential submucosal growth and the mass is indistinguishable from the left prevertebral space and lower left stylopharyngeaus. No convincing adenopathy. 2. Mixed density right upper lobe nodule, attention on anticipated PET. Electronically Signed   By: JMonte FantasiaM.D.   On: 11/22/2020 11:47   NM PET Image Initial (PI) Skull Base To Thigh  Result Date: 12/07/2020 CLINICAL DATA:  Initial treatment strategy for tonsillar cancer. EXAM: NUCLEAR MEDICINE PET SKULL BASE TO THIGH TECHNIQUE: 6.7 mCi F-18 FDG was injected intravenously. Full-ring PET imaging was performed from the skull base to thigh after the radiotracer. CT data was obtained and used for attenuation correction and anatomic localization. Fasting blood glucose: 103 mg/dl COMPARISON:  CT neck 11/21/2020 FINDINGS: Mediastinal blood pool activity: SUV max 2.1 Liver activity: SUV max 3.0 NECK: The large left palatine tonsillar mass has maximum SUV of 16.1, with hypermetabolic activity measuring about 4.1 cm in long axis. Left level III lymph node measuring 0.8 cm in short axis on  image 78 series 3 has maximum SUV of 4.4, favoring malignant involvement. Incidental CT findings: Mild bilateral common carotid atherosclerotic calcification. CHEST: Ill-defined 1.0 by 0.9 cm nodule possibly with sub solid elements in the right lower lobe along the azygoesophageal recess on image 131 series 3, maximum SUV  5.8 suspicious for either malignancy or active granulomatous process. This has a somewhat unusual elongated morphology vertically, and was not readily apparent on 04/24/2020 chest CT. Ill-defined ground-glass density peripherally in the right upper lobe measuring about 1.9 by 1.6 cm on image 111 series 3, new compared to 04/24/2020, maximum SUV 1.4. Likely benign/inflammatory. 0.6 cm nodule in the right middle lobe near the minor fissure on image 132 series 3, maximum SUV 1.1, nonspecific. This nodule has increased conspicuity compared to 04/24/2020. Incidental CT findings: Coronary, aortic arch, and branch vessel atherosclerotic vascular disease. ABDOMEN/PELVIS: No significant abnormal hypermetabolic activity in this region. Incidental CT findings: Aortoiliac atherosclerotic vascular disease. Ventral hernia mesh noted. Stable high density along the lateral capsular margin of the left kidney on image 178 series 3. SKELETON: Accentuated activity along the postoperative findings of the right distal acromion, thought to be benign. Accentuated activity along the carpometacarpal articulations laterally in the wrist, thought to be degenerative. Incidental CT findings: Cervical, thoracic, and lumbar spondylosis. Suspected postoperative findings eccentric to the right at C5-C6-C7. Fusion at C6-7. IMPRESSION: 1. 4.1 cm left palatine tonsillar mass has a maximum SUV of 16.1, compatible with malignancy. 2. A 0.8 cm left level III lymph node has maximum SUV of 4.4, compatible with malignant involvement. 3. A somewhat vertically elongated 0 point 9 by 1.0 cm right lower lobe nodule along the azygoesophageal  recess has accentuated metabolic activity with maximum SUV 5.8. This has sub solid components was not visible on the chest CT of 04/24/2020. Possibilities include unusual morphology of metastatic disease versus an inflammatory process. 4. There is also a ground-glass opacity laterally in the right upper lobe which is likely inflammatory and with maximum SUV of only 1.4. 5. 0.6 cm right middle lobe nodule is not hypermetabolic and is nonspecific. Surveillance suggested. 6. Other imaging findings of potential clinical significance: Aortic Atherosclerosis (ICD10-I70.0). Coronary atherosclerosis. Spondylosis. Electronically Signed   By: Van Clines M.D.   On: 12/07/2020 14:52    Labs:  CBC: No results for input(s): WBC, HGB, HCT, PLT in the last 8760 hours.  COAGS: No results for input(s): INR, APTT in the last 8760 hours.  BMP: Recent Labs    11/21/20 1509  CREATININE 1.30*    LIVER FUNCTION TESTS: No results for input(s): BILITOT, AST, ALT, ALKPHOS, PROT, ALBUMIN in the last 8760 hours.  TUMOR MARKERS: No results for input(s): AFPTM, CEA, CA199, CHROMGRNA in the last 8760 hours.  Assessment and Plan:  Hx renal cancer- partial nephrectomy 12 yrs ago Skin cancers New dx Tonsil cancer Dysphagia To start chemo 12/13/20 Scheduled for Port a cath placement and percutaneous gastric tube placement  Risks and benefits of image guided port-a-catheter placement was discussed with the patient including, but not limited to bleeding, infection, pneumothorax, or fibrin sheath development and need for additional procedures. All of the patient's questions were answered, patient is agreeable to proceed. Consent signed and in chart.   Risks and benefits image guided gastrostomy tube placement was discussed with the patient including, but not limited to the need for a barium enema during the procedure, bleeding, infection, peritonitis and/or damage to adjacent structures. All of the patient's  questions were answered, patient is agreeable to proceed. Consent signed and in chart.   Thank you for this interesting consult.  I greatly enjoyed meeting QUADREE ASBERRY and look forward to participating in their care.  A copy of this report was sent to the requesting provider on this date.  Electronically Signed: Olin Hauser  A Aryelle Figg, PA-C 12/11/2020, 10:08 AM   I spent a total of  30 Minutes   in face to face in clinical consultation, greater than 50% of which was counseling/coordinating care for [facility] and perc gastric tube placement

## 2020-12-11 NOTE — Procedures (Signed)
Interventional Radiology Procedure Note  Procedure: Placement of percutaneous 59F pull-through gastrostomy tube. Complications: None Recommendations: - OK to use - May advance diet as tolerated and begin using tube tomorrow morning - DC 1 hr  Signed,   Dulcy Fanny. Earleen Newport, DO

## 2020-12-12 ENCOUNTER — Inpatient Hospital Stay (HOSPITAL_COMMUNITY): Payer: Medicare HMO

## 2020-12-12 DIAGNOSIS — Z905 Acquired absence of kidney: Secondary | ICD-10-CM | POA: Diagnosis not present

## 2020-12-12 DIAGNOSIS — E785 Hyperlipidemia, unspecified: Secondary | ICD-10-CM | POA: Diagnosis not present

## 2020-12-12 DIAGNOSIS — Z87891 Personal history of nicotine dependence: Secondary | ICD-10-CM | POA: Diagnosis not present

## 2020-12-12 DIAGNOSIS — C099 Malignant neoplasm of tonsil, unspecified: Secondary | ICD-10-CM | POA: Diagnosis not present

## 2020-12-12 DIAGNOSIS — I10 Essential (primary) hypertension: Secondary | ICD-10-CM | POA: Diagnosis not present

## 2020-12-12 DIAGNOSIS — Z85528 Personal history of other malignant neoplasm of kidney: Secondary | ICD-10-CM | POA: Diagnosis not present

## 2020-12-12 DIAGNOSIS — Z8249 Family history of ischemic heart disease and other diseases of the circulatory system: Secondary | ICD-10-CM | POA: Diagnosis not present

## 2020-12-12 NOTE — Patient Instructions (Addendum)
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  You will be treated in the clinic weekly in conjunction with radiation therapy with a chemotherapy drug called cisplatin.  The intent of treatment is cure.  You will see the doctor regularly throughout treatment.  We will obtain blood work from you prior to every treatment and monitor your results to make sure it is safe to give your treatment. The doctor monitors your response to treatment by the way you are feeling, your blood work, and by obtaining scans periodically.  There will be wait times while you are here for treatment.  It will take about 30 minutes to 1 hour for your lab work to result.  Then there will be wait times while pharmacy mixes your medications.     Medications you will receive in the clinic prior to your chemotherapy medications:  Aloxi:  ALOXI is used in adults to help prevent nausea and vomiting that happens with certain chemotherapy drugs.  Aloxi is a long acting medication, and will remain in your system for about two days.   Emend:  This is an anti-nausea medication that is used with Aloxi to help prevent nausea and vomiting caused by chemotherapy.  Dexamethasone:  This is a steroid given prior to chemotherapy to help prevent allergic reactions; it may also help prevent and control nausea and diarrhea.     Cisplatin   About This Drug   Cisplatin is a drug used to treat cancer. This drug is given in the vein (IV).  This will take 1 hour to infuse.  With this drug you will receive 2 hours of IV fluid hydration prior to administration, and 1 hour of IV hydration after administration.  This is to help protect your kidneys.  You will have to urinate 200 mL prior to receiving this medication.  We will give you something to measure your urine in.  Possible Side Effects   Bone marrow suppression. This is a decrease in the number of white  blood cells, red blood cells, and platelets. This may raise your risk of infection, make you tired and weak, and raise your risk of bleeding.    Decreased hearing, ringing in the ear    Nausea and vomiting (throwing up)    Changes in your kidney function    Effects on the nerves called peripheral neuropathy. You may feel numbness, tingling, or pain in your hands and feet. It may be hard for you to button your clothes, open jars, or walk as usual. The effect on the nerves may get worse with more doses of the drug. These effects get better in some people after the drug is stopped but it does not get better in all people.    Hair loss. Hair loss is often temporary, although with certain medicine, hair loss can sometimes be permanent. Hair loss may happen suddenly or gradually. If you lose hair, you may lose it from your head, face, armpits, pubic area, chest, and/or legs. You may also notice your hair getting thin.   Note: Not all possible side effects are included above.   Warnings and Precautions   Hearing loss in one or both ears may happen. While very rare, it is not known if these effects are reversible.    Blurred vision or other changes in eyesight    Severe changes in your kidney function, which can cause kidney failure.    Severe  bone marrow suppression and neutropenic fever, a type of fever that can develop when you have a very low number of white blood cells which can be life-threatening.    Severe nausea and vomiting if medications to prevent these symptoms are not taken    Severe effects on the nerves    Allergic reactions, including anaphylaxis are rare but may happen in some patients and can be life-threatening Signs of allergic reaction to this drug may be swelling of the face, feeling like your tongue or throat are swelling, trouble breathing, rash, itching, fever, chills, feeling dizzy, and/or feeling that your heart is beating in a fast or not normal way. If this happens,  do not take another dose of this drug. You should get urgent medical treatment.    This drug may raise your risk of getting a second cancer, such as acute leukemia.    Skin and tissue irritation including redness, pain, warmth, or swelling at the IV site if the drug leaks out of the vein and into nearby tissue. Very rarely it may cause local tissue necrosis (tissue death). Note: Some of the side effects above are very rare. If you have concerns and/or questions, please discuss them with your medical team. Important Information    This drug may be present in the saliva, tears, sweat, urine, stool, vomit, semen, and vaginal secretions. Talk to your doctor and/or your nurse about the necessary precautions to take during this time.   Treating Side Effects    Manage tiredness by pacing your activities for the day.    Be sure to include periods of rest between energy-draining activities.  To decrease the risk of infection, wash your hands regularly.    Avoid close contact with people who have a cold, the flu, or other infections.    Take your temperature as your doctor or nurse tells you, and whenever you feel like you may have a fever.    To help decrease the risk of bleeding, use a soft toothbrush. Check with your nurse before using dental floss.    Be very careful when using knives or tools.    Use an electric shaver instead of a razor.    Drink plenty of fluids (a minimum of eight glasses per day is recommended).    If you throw up, you should drink more fluids so that you do not become dehydrated (lack of water in the body from losing too much fluid).    To help with nausea and vomiting, eat small, frequent meals instead of three large meals a day. Choose foods and drinks that are at room temperature. Ask your nurse or doctor about other helpful tips and medicine that is available to help stop or lessen these symptoms.    If you have numbness and tingling in your hands and feet, be  careful when cooking, walking, and handling sharp objects and hot liquids.    To help with hair loss, wash with a mild shampoo and avoid washing your hair every day. Avoid coloring your hair.    Avoid rubbing your scalp, pat your hair or scalp dry.    Limit your use of hair spray, electric curlers, blow dryers, and curling irons.    If you are interested in getting a wig, talk to your nurse and they can help you get in touch with programs in your local area.    Food and Drug Interactions   There are no known interactions of cisplatin with food.  This drug may interact with other medicines. Tell your doctor and pharmacist about all the prescription and over-the-counter medicines and dietary supplements (vitamins, minerals, herbs, and others) that you are taking at this time. Also, check with your doctor or pharmacist before starting any new prescription or over-the-counter medicines, or dietary supplements to make sure that there are no interactions.   When to Call the Doctor   Call your doctor or nurse if you have any of these symptoms and/or any new or unusual symptoms:    Fever of 100.4 F (38 C) or higher    Chills    Blurred vision or other changes in eyesight    Tiredness that interferes with your daily activities  Feeling dizzy or lightheaded    Easy bleeding or bruising    Decreased or loss of hearing    Ringing in the ear    Nausea that stops you from eating or drinking and/or is not relieved by prescribed medicines    Throwing up more than 3 times a day    Decreased or very dark urine    Numbness, tingling, or pain in your hands and feet    While you are getting this drug, please tell your nurse right away if you have any pain, redness, or swelling at the site of the IV infusion.    Signs of allergic reaction: swelling of the face, feeling like your tongue or throat are swelling, trouble breathing, rash, itching, fever, chills, feeling dizzy, and/or feeling that  your heart is beating in a fast or not normal way. If this happens, call 911 for emergency care.    If you think you may be pregnant or may have impregnated your partner   Reproduction Warnings    Pregnancy warning: This drug can have harmful effects on the unborn baby. Women of childbearing potential should use effective methods of birth control during your cancer treatment and for at 14 months after stopping treatment. Men with male partners of childbearing potential should use effective methods of birth control during your cancer treatment and for 11 months after stopping treatment. Let your doctor know right away if you think you may be pregnant or may have impregnated your partner.    Breastfeeding warning: Women should not breastfeed during treatment because this drug could enter the breast milk and cause harm to a breastfeeding baby.     Fertility warning: In men and women both, this drug may affect your ability to have children in the future. Talk with your doctor or nurse if you plan to have children. Ask for information on sperm or egg banking.     SELF CARE ACTIVITIES WHILE ON CHEMOTHERAPY/IMMUNOTHERAPY:  Hydration Increase your fluid intake 48 hours prior to treatment and drink at least 8 to 12 cups (64 ounces) of water/decaffeinated beverages per day after treatment. You can still have your cup of coffee or soda but these beverages do not count as part of your 8 to 12 cups that you need to drink daily. No alcohol intake.  Medications Continue taking your normal prescription medication as prescribed.  If you start any new herbal or new supplements please let us know first to make sure it is safe.  Mouth Care Have teeth cleaned professionally before starting treatment. Keep dentures and partial plates clean. Use soft toothbrush and do not use mouthwashes that contain alcohol. Biotene is a good mouthwash that is available at most pharmacies or may be ordered by calling [phone]  [phone]. Use warm  salt water gargles (1 teaspoon salt per 1 quart warm water) before and after meals and at bedtime. Or you may rinse with 2 tablespoons of three-percent hydrogen peroxide mixed in eight ounces of water. If you are still having problems with your mouth or sores in your mouth please call the clinic. If you need dental work, please let the doctor know before you go for your appointment so that we can coordinate the best possible time for you in regards to your chemo regimen. You need to also let your dentist know that you are actively taking chemo. We may need to do labs prior to your dental appointment.  Skin Care Always use sunscreen that has not expired and with SPF (Sun Protection Factor) of 50 or higher. Wear hats to protect your head from the sun. Remember to use sunscreen on your hands, ears, face, & feet.  Use good moisturizing lotions such as udder cream, eucerin, or even Vaseline. Some chemotherapies can cause dry skin, color changes in your skin and nails.    Avoid long, hot showers or baths. Use gentle, fragrance-free soaps and laundry detergent. Use moisturizers, preferably creams or ointments rather than lotions because the thicker consistency is better at preventing skin dehydration. Apply the cream or ointment within 15 minutes of showering. Reapply moisturizer at night, and moisturize your hands every time after you wash them.   Infection Prevention Please wash your hands for at least 30 seconds using warm soapy water. Handwashing is the #1 way to prevent the spread of germs. Stay away from sick people or people who are getting over a cold. If you develop respiratory systems such as green/yellow mucus production or productive cough or persistent cough let us know and we will see if you need an antibiotic. It is a good idea to keep a pair of gloves on when going into grocery stores/[employer] to decrease your risk of coming into contact with germs on the carts, etc. Carry  alcohol hand gel with you at all times and use it frequently if out in public. If your temperature reaches 100.5 or higher please call the clinic and let us know.  If it is after hours or on the weekend please go to the ER if your temperature is over 100.4.  Please have your own personal thermometer at home to use.    Sex and bodily fluids If you are going to have sex, a condom must be used to protect the person that isn't taking immunotherapy. For a few days after treatment, immunotherapy can be excreted through your bodily fluids.  When using the toilet please close the lid and flush the toilet twice.  Do this for a few day after you have had immunotherapy.   Contraception It is not known for sure whether or not immunotherapy drugs can be passed on through semen or secretions from the vagina. Because of this some doctors advise people to use a barrier method if you have sex during treatment. This applies to vaginal, anal or oral sex.  Generally, doctors advise a barrier method only for the time you are actually having the treatment and for about a week after your treatment.  Advice like this can be worrying, but this does not mean that you have to avoid being intimate with your partner. You can still have close contact with your partner and continue to enjoy sex.  Animals If you have cats or birds we just ask that you not change the litter or change the  cage.  Please have someone else do this for you while you are on immunotherapy.   Food Safety During and After Cancer Treatment Food safety is important for people both during and after cancer treatment. Cancer and cancer treatments, such as chemotherapy, radiation therapy, and stem cell/bone marrow transplantation, often weaken the immune system. This makes it harder for your body to protect itself from foodborne illness, also called food poisoning. Foodborne illness is caused by eating food that contains harmful bacteria, parasites, or  viruses.  Foods to avoid Some foods have a higher risk of becoming tainted with bacteria. These include: Unwashed fresh fruit and vegetables, especially leafy vegetables that can hide dirt and other contaminants Raw sprouts, such as alfalfa sprouts Raw or undercooked beef, especially ground beef, or other raw or undercooked meat and poultry Fatty, fried, or spicy foods immediately before or after treatment.  These can sit heavy on your stomach and make you feel nauseous. Raw or undercooked shellfish, such as oysters. Sushi and sashimi, which often contain raw fish.  Unpasteurized beverages, such as unpasteurized fruit juices, raw milk, raw yogurt, or cider Undercooked eggs, such as soft boiled, over easy, and poached; raw, unpasteurized eggs; or foods made with raw egg, such as homemade raw cookie dough and homemade mayonnaise  Simple steps for food safety  Shop smart. Do not buy food stored or displayed in an unclean area. Do not buy bruised or damaged fruits or vegetables. Do not buy cans that have cracks, dents, or bulges. Pick up foods that can spoil at the end of your shopping trip and store them in a cooler on the way home.  Prepare and clean up foods carefully. Rinse all fresh fruits and vegetables under running water, and dry them with a clean towel or paper towel. Clean the top of cans before opening them. After preparing food, wash your hands for 20 seconds with hot water and soap. Pay special attention to areas between fingers and under nails. Clean your utensils and dishes with hot water and soap. Disinfect your kitchen and cutting boards using 1 teaspoon of liquid, unscented bleach mixed into 1 quart of water.    Dispose of old food. Eat canned and packaged food before its expiration date (the "use by" or "best before" date). Consume refrigerated leftovers within 3 to 4 days. After that time, throw out the food. Even if the food does not smell or look spoiled, it still may  be unsafe. Some bacteria, such as Listeria, can grow even on foods stored in the refrigerator if they are kept for too long.  Take precautions when eating out. At restaurants, avoid buffets and salad bars where food sits out for a long time and comes in contact with many people. Food can become contaminated when someone with a virus, often a norovirus, or another "bug" handles it. Put any leftover food in a "to-go" container yourself, rather than having the server do it. And, refrigerate leftovers as soon as you get home. Choose restaurants that are clean and that are willing to prepare your food as you order it cooked.    SYMPTOMS TO REPORT AS SOON AS POSSIBLE AFTER TREATMENT:  FEVER GREATER THAN 100.4 F CHILLS WITH OR WITHOUT FEVER NAUSEA AND VOMITING THAT IS NOT CONTROLLED WITH YOUR NAUSEA MEDICATION UNUSUAL SHORTNESS OF BREATH UNUSUAL BRUISING OR BLEEDING TENDERNESS IN MOUTH AND THROAT WITH OR WITHOUT PRESENCE OF ULCERS URINARY PROBLEMS BOWEL PROBLEMS UNUSUAL RASH     Wear comfortable clothing and clothing  appropriate for easy access to any Portacath or PICC line. Let us know if there is anything that we can do to make your therapy better!   What to do if you need assistance after hours or on the weekends: CALL [phone].  HOLD on the line, do not hang up.  You will hear multiple messages but at the end you will be connected with a nurse triage line.  They will contact the doctor if necessary.  Most of the time they will be able to assist you.  Do not call the hospital operator.    I have been informed and understand all of the instructions given to me and have received a copy. I have been instructed to call the clinic [phone] or my family physician as soon as possible for continued medical care, if indicated. I do not have any more questions at this time but understand that I may call the [facility] or the Patient Navigator at [phone] during office hours should I  have questions or need assistance in obtaining follow-up care.

## 2020-12-12 NOTE — Progress Notes (Signed)

## 2020-12-13 ENCOUNTER — Telehealth: Payer: Self-pay | Admitting: Student

## 2020-12-13 ENCOUNTER — Ambulatory Visit (HOSPITAL_COMMUNITY): Payer: Medicare HMO | Admitting: Hematology

## 2020-12-13 ENCOUNTER — Ambulatory Visit (HOSPITAL_COMMUNITY): Payer: Medicare HMO

## 2020-12-13 ENCOUNTER — Other Ambulatory Visit (HOSPITAL_COMMUNITY): Payer: Medicare HMO

## 2020-12-13 NOTE — Telephone Encounter (Signed)
Douglas [name] is a 75 y.o. male s/p image guided gastrostomy tube placement with Dr. Earleen Newport on Monday 12/11/20.   Received a call from patient regarding if it is ok for him to take a shower.  Instructed patient that ok to take shower, but dry the insertion site completely after taking shower, and monitor the site for signs of infection such as redness of skin or puss like drainage.  Patient verbalized understanding.   Please call IR for further questions and concerns regarding the G tube.   [name] [name] PA-C 12/13/2020 11:26 AM

## 2020-12-18 ENCOUNTER — Ambulatory Visit (HOSPITAL_COMMUNITY): Payer: Medicare HMO | Admitting: Dietician

## 2020-12-18 NOTE — Progress Notes (Signed)
Nutrition Follow-up:  Patient with newly diagnosed tonsil cancer. Plans to start concurrent chemoradiation with weekly cisplatin. He is s/p PEG placement on 8/8  Spoke with patient via telephone. He reports appetite is good, denies progression of dysphagia. He reports soreness around PEG, but this is getting better. Patient is flushing tube with water daily. He gave first feeding via PEG yesterday, says he tolerated well. Yesterday he ate piece of toast for breakfast, salad with home grown tomatoes for lunch, chicken and cheese sandwich on homemade bread for dinner, snacked on strawberries and bananas.He drank coffee, water, and lemonade. Patient continues to drink CIB, but not everyday. He is planning to switch to full fat milk after he finishes drinking current 2% gallon.   Medications: reviewed  Labs: reviewed  Anthropometrics: Last weight 150 lb on 8/8 increased from 146 lb 11.2 oz on 7/27   5/7 - 150 lb 4/30 - 151 lb 3 oz  Estimated Energy Needs  Kcals: 2000-2200 Protein: 93-106 Fluid: 2.1 L  NUTRITION DIAGNOSIS: Predicted suboptimal intake ongoing  INTERVENTION:   Continue eating regular textures as able Encouraged high protein snacks in between meals Continue drinking CIB with whole milk daily Patient will give 1 carton Osmolite 1.5 via PEG daily, instructed to increase tube feedings with decreased oral intake   Osmolite 1.5 - 6 cartons split over four feedings/day. Flush tube with 60 ml water before and after each feeding. Drink by mouth or give via tube additional 2.5 cups fluids/day. This provides 2130 kcal, 89 g protein, 2159 ml total water/day. Meets 100% of needs.   MONITORING, EVALUATION, GOAL: weight trends, oral intake, tube feeding   NEXT VISIT: To be scheduled weekly with treatment plan

## 2020-12-20 ENCOUNTER — Ambulatory Visit (HOSPITAL_COMMUNITY): Payer: Medicare HMO | Admitting: Speech Pathology

## 2020-12-22 DIAGNOSIS — C099 Malignant neoplasm of tonsil, unspecified: Secondary | ICD-10-CM | POA: Diagnosis not present

## 2020-12-22 DIAGNOSIS — E785 Hyperlipidemia, unspecified: Secondary | ICD-10-CM | POA: Diagnosis not present

## 2020-12-22 DIAGNOSIS — C098 Malignant neoplasm of overlapping sites of tonsil: Secondary | ICD-10-CM | POA: Diagnosis not present

## 2020-12-22 DIAGNOSIS — Z905 Acquired absence of kidney: Secondary | ICD-10-CM | POA: Diagnosis not present

## 2020-12-22 DIAGNOSIS — Z8249 Family history of ischemic heart disease and other diseases of the circulatory system: Secondary | ICD-10-CM | POA: Diagnosis not present

## 2020-12-22 DIAGNOSIS — Z87891 Personal history of nicotine dependence: Secondary | ICD-10-CM | POA: Diagnosis not present

## 2020-12-22 DIAGNOSIS — I10 Essential (primary) hypertension: Secondary | ICD-10-CM | POA: Diagnosis not present

## 2020-12-22 DIAGNOSIS — C770 Secondary and unspecified malignant neoplasm of lymph nodes of head, face and neck: Secondary | ICD-10-CM | POA: Diagnosis not present

## 2020-12-22 DIAGNOSIS — Z85528 Personal history of other malignant neoplasm of kidney: Secondary | ICD-10-CM | POA: Diagnosis not present

## 2020-12-29 ENCOUNTER — Other Ambulatory Visit (HOSPITAL_COMMUNITY): Payer: Self-pay

## 2020-12-29 ENCOUNTER — Other Ambulatory Visit: Payer: Self-pay | Admitting: Hematology and Oncology

## 2020-12-29 MED ORDER — MAGIC MOUTHWASH W/LIDOCAINE: 5.0000 mL | Freq: Four times a day (QID) | ORAL | 3 refills | Status: DC | PRN

## 2020-12-29 NOTE — Progress Notes (Signed)
Pharmacist Chemotherapy Monitoring - Initial Assessment    Anticipated start date: 01/09/21   The following has been reviewed per standard work regarding the patient's treatment regimen: The patient's diagnosis, treatment plan and drug doses, and organ/hematologic function Lab orders and baseline tests specific to treatment regimen  The treatment plan start date, drug sequencing, and pre-medications Prior authorization status  Patient's documented medication list, including drug-drug interaction screen and prescriptions for anti-emetics and supportive care specific to the treatment regimen The drug concentrations, fluid compatibility, administration routes, and timing of the medications to be used The patient's access for treatment and lifetime cumulative dose history, if applicable  The patient's medication allergies and previous infusion related reactions, if applicable   Changes made to treatment plan:  treatment plan date and IV fluid  Follow up needed:  N/A   Wynona Neat, [facility], 12/29/2020  11:22 AM

## 2021-01-01 ENCOUNTER — Telehealth (HOSPITAL_COMMUNITY): Payer: Self-pay | Admitting: Dietician

## 2021-01-01 NOTE — Telephone Encounter (Signed)
Nutrition  Attempted to contact patient via telephone for nutrition follow-up. Patient did not answer. Left VM with request for return call.

## 2021-01-03 DIAGNOSIS — Z85528 Personal history of other malignant neoplasm of kidney: Secondary | ICD-10-CM | POA: Diagnosis not present

## 2021-01-03 DIAGNOSIS — C770 Secondary and unspecified malignant neoplasm of lymph nodes of head, face and neck: Secondary | ICD-10-CM | POA: Diagnosis not present

## 2021-01-03 DIAGNOSIS — Z905 Acquired absence of kidney: Secondary | ICD-10-CM | POA: Diagnosis not present

## 2021-01-03 DIAGNOSIS — Z87891 Personal history of nicotine dependence: Secondary | ICD-10-CM | POA: Diagnosis not present

## 2021-01-03 DIAGNOSIS — I10 Essential (primary) hypertension: Secondary | ICD-10-CM | POA: Diagnosis not present

## 2021-01-03 DIAGNOSIS — C098 Malignant neoplasm of overlapping sites of tonsil: Secondary | ICD-10-CM | POA: Diagnosis not present

## 2021-01-03 DIAGNOSIS — E785 Hyperlipidemia, unspecified: Secondary | ICD-10-CM | POA: Diagnosis not present

## 2021-01-03 DIAGNOSIS — Z8249 Family history of ischemic heart disease and other diseases of the circulatory system: Secondary | ICD-10-CM | POA: Diagnosis not present

## 2021-01-03 DIAGNOSIS — C099 Malignant neoplasm of tonsil, unspecified: Secondary | ICD-10-CM | POA: Diagnosis not present

## 2021-01-04 ENCOUNTER — Telehealth (HOSPITAL_COMMUNITY): Payer: Self-pay | Admitting: Dietician

## 2021-01-04 ENCOUNTER — Encounter (HOSPITAL_COMMUNITY): Payer: Medicare HMO | Admitting: Dietician

## 2021-01-04 NOTE — Telephone Encounter (Signed)
Nutrition  Second attempt at contacting patient for nutrition follow-up. Left message with request for return call. Contact information provided.

## 2021-01-04 NOTE — Telephone Encounter (Signed)
Nutrition Follow-up:  Received return call from patient this afternoon. He is receiving concurrent chemoradiation therapy with weekly cisplatin. Patient is s/p PEG 8/8  Patient reports having a good appetite and eating 4 meals daily. Patient reports tolerating regular textures with difficulty. He has been eating oatmeal, smoothies, eggs, sandwiches, steak, pork chops, noodles. Patient is drinking lemonade, water and CIB with whole milk. Patient is not using feeding tube at this time, he is flushing with 60 ml 3 times daily. Patient denies nausea, vomiting, diarrhea, constipation, dry mouth. He continues making homemade breads and enjoys mowing this lawn. Patient complains of sore throat, ear pain, and headache. He has been taking tylenol for this, has tried magic mouthwash but this does not help with his sore throat. Patient reports his throat does not hurt if he is eating or drinking.     Medications: reviewed  Labs: reviewed  Anthropometrics: No new weights for review. Last weight 150 lb on 8/8 increased from 146 lb 11.2 oz on 7/27  Estimated Energy Needs  Kcals: 2000-2200 Protein: 93-106 Fluid: 2.1 L  NUTRITION DIAGNOSIS: Predicted suboptimal intake stable   INTERVENTION:  Continue eating regular textures as able Encouraged high calorie, high protein foods Continue drinking CIB with whole milk daily Continue baking soda, salt water rinses several times daily Patient will initiate tube feedings if weight declines and unable to tolerate adequate calories/protein orally Continue flushing tube daily Patient has contact information    MONITORING, EVALUATION, GOAL: weight trends, intake   NEXT VISIT: f/u via telephone Thursday September 8

## 2021-01-05 ENCOUNTER — Encounter (HOSPITAL_COMMUNITY): Payer: Self-pay | Admitting: Hematology and Oncology

## 2021-01-07 NOTE — Progress Notes (Signed)
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 There is preferential submucosal growth and the mass is indistinguishable from the left prevertebral space and lower left stylopharyngeaus. No convincing adenopathy. 2. Mixed density right upper lobe nodule, attention on anticipated PET.   11/29/2020 Initial Diagnosis   Tonsil cancer ([place])   11/29/2020 Cancer Staging   Staging form: Pharynx - HPV-Mediated Oropharynx, AJCC 8th Edition - Clinical stage from 11/29/2020: cT4, p16+ - Signed by [name], MD on 11/29/2020 Stage prefix: Initial diagnosis   01/09/2021 -  Chemotherapy    Patient is on Treatment Plan: HEAD/NECK CISPLATIN Q7D         CANCER STAGING: Cancer Staging Tonsil cancer [facility]) Staging form: Pharynx - HPV-Mediated Oropharynx, AJCC 8th Edition - Clinical stage from 11/29/2020: cT4, p16+ - Signed by [name], MD on 11/29/2020   INTERVAL HISTORY:  Douglas [name], a 75 y.o. male, returns for routine follow-up and consideration for next cycle of chemotherapy. Douglas [name] was last seen on 11/29/2020 by Dr. Alvy Bimler.  Due for cycle #1 of Cisplatin today.   Overall, he tells me he has been feeling pretty well. He reports pain in his left neck, ear, and head.  He is taking tylenol for this pain which has not helped. He reports a mass on the left side of his neck which has inhibited his speech slightly. He also reports mild baseline hearing loss that has worsened on his left side recently. He reports good appetite, and he reports pain while swallowing that does not inhibit his eating. He reports losing 9 pounds since January. He is drinking 1 carnation instant breakfast every other day with 2 meals a day. He has a feeding tube placed, but he reports he has not needed to use it regularly. He denies tingling/numbness.  Prior to retirement, he worked for [employer]. He quit smoking 12 years ago after smoking 2 ppd for 20 years. 2 sisters and a brother had lung cancer.   Overall, he feels ready for next cycle of chemo today.   REVIEW OF SYSTEMS:  Review of Systems  Constitutional:  Positive for unexpected weight change (-9 lbs). Negative for appetite change and fatigue.  HENT:   Positive for hearing loss (worse on L side), lump/mass (L neck), sore throat, trouble swallowing and voice change.   Musculoskeletal:  Positive for neck pain (8/10 L neck, ear, and head pain).  Neurological:  Positive for headaches. Negative for numbness.  Hematological:  Bruises/bleeds easily (bruising).  All other systems reviewed and are negative.  PAST MEDICAL/SURGICAL HISTORY:  Past Medical History:  Diagnosis Date   Arthritis    Cancer ([place])    Skin, and Kidney   Hypertension    Pre-diabetes    Skin cancer  Past Surgical History:  Procedure Laterality Date   COLONOSCOPY     HERNIA REPAIR     IR GASTROSTOMY TUBE MOD SED  12/11/2020   IR IMAGING GUIDED PORT INSERTION  12/11/2020   LAPAROSCOPIC PARTIAL NEPHRECTOMY Left    PARTIAL NEPHRECTOMY Left 2009   REVERSE SHOULDER ARTHROPLASTY Right 09/10/2019   Procedure: REVERSE SHOULDER ARTHROPLASTY;  Surgeon: [name], MD;  Location: [facility];  Service: Orthopedics;  Laterality: Right;  interscalene block   SKIN SURGERY       SOCIAL HISTORY:  Social History   Socioeconomic History   Marital status: Widowed    Spouse name: Not on file   Number of children: Not on file   Years of education: Not on file   Highest education level: Not on file  Occupational History    Employer: [employer]  Tobacco Use   Smoking status: Former    Packs/day: 1.00    Years: 20.00    Pack years: 20.00    Types: Cigarettes    Quit date: 2010    Years since quitting: 12.6   Smokeless tobacco: Never  Vaping Use   Vaping Use: Never used  Substance and Sexual Activity   Alcohol use: Yes    Alcohol/week: 1.0 standard drink    Types: 1 Cans of beer per week    Comment: occ   Drug use: Never   Sexual activity: Not on file  Other Topics Concern   Not on file  Social History Narrative   Not on file   Social Determinants of Health   Financial Resource Strain: Low Risk    Difficulty of Paying Living Expenses: Not hard at all  Food Insecurity: No Food Insecurity   Worried About Charity fundraiser in the Last Year: Never true   [place] in the Last Year: Never true  Transportation Needs: No Transportation Needs   Lack of Transportation (Medical): No   Lack of Transportation (Non-Medical): No  Physical Activity: Sufficiently Active   Days of Exercise per Week: 7 days   Minutes of Exercise per Session: 30 min  Stress: No Stress Concern Present   Feeling of Stress : Not at all  Social Connections: Socially Isolated   Frequency of Communication with Friends and Family: More than three times a week   Frequency of Social Gatherings with Friends and Family: More than three times a week   Attends Religious Services: Never   Marine scientist or Organizations: No   Attends Archivist Meetings: Never   Marital Status: Widowed  Human resources officer Violence: Not At Risk   Fear of Current or Ex-Partner: No   Emotionally Abused: No   Physically Abused: No   Sexually Abused: No    FAMILY HISTORY:  Family  History  Problem Relation Age of Onset   Diabetes Mother    Pulmonary embolism Father    Lung cancer Sister    Lung cancer Sister    Diabetes Brother    Lung cancer Brother     CURRENT MEDICATIONS:  Current Outpatient Medications  Medication Sig Dispense Refill   amLODipine (NORVASC) 10 MG tablet Take 10 mg by mouth daily.     atenolol (TENORMIN) 25 MG tablet Take 25 mg by mouth daily.     cholecalciferol (VITAMIN D3) 25 MCG (1000 UNIT) tablet Take 1,000 Units by mouth daily.     CISPLATIN IV Inject 40 mg/m2 into the vein once a week.  diclofenac (VOLTAREN) 75 MG EC tablet Take 75 mg by mouth 2 (two) times daily.     lidocaine-prilocaine (EMLA) cream Apply to affected area once 30 g 3   lisinopril (ZESTRIL) 40 MG tablet Take 40 mg by mouth daily.     magic mouthwash w/lidocaine SOLN Take 5 mLs by mouth 4 (four) times daily as needed for mouth pain. 250 mL 3   Menaquinone-7 (VITAMIN K2 PO) Take 1 tablet by mouth daily.     Multiple Vitamins-Minerals (MULTIVITAMIN WITH MINERALS) tablet Take 1 tablet by mouth daily.     Nutritional Supplements (FEEDING SUPPLEMENT, OSMOLITE 1.5 CAL,) LIQD Give 6 cartons Osmolite 1.5 via tube split over 4 feedings/day. Flush tube with 60 ml water before and after each bolus feeding. Drink by mouth or give via tube additional 2.5 c fluids/day. This provides 2130 kcal, 89 grams protein, 2159 ml total water. Meets 100% needs.  0   ondansetron (ZOFRAN) 8 MG tablet Take 1 tablet (8 mg total) by mouth 2 (two) times daily as needed. Start on the third day after cisplatin chemotherapy. 30 tablet 1   prochlorperazine (COMPAZINE) 10 MG tablet Take 1 tablet (10 mg total) by mouth every 6 (six) hours as needed (Nausea or vomiting). 30 tablet 1   simvastatin (ZOCOR) 20 MG tablet Take 20 mg by mouth daily.     tamsulosin (FLOMAX) 0.4 MG CAPS capsule Take 0.8 mg by mouth daily.     zinc gluconate 50 MG tablet Take 50 mg by mouth daily.     No current  facility-administered medications for this visit.    ALLERGIES:  No Known Allergies  PHYSICAL EXAM:  Performance status (ECOG): 0 - Asymptomatic  There were no vitals filed for this visit. Wt Readings from Last 3 Encounters:  12/11/20 150 lb (68 kg)  11/29/20 146 lb 11.2 oz (66.5 kg)  09/10/19 150 lb (68 kg)   Physical Exam Vitals reviewed.  Constitutional:      Appearance: Normal appearance.  HENT:     Mouth/Throat:     Comments: 4 cm L tonsillar mass  Cardiovascular:     Rate and Rhythm: Normal rate and regular rhythm.     Pulses: Normal pulses.     Heart sounds: Normal heart sounds.  Pulmonary:     Effort: Pulmonary effort is normal.     Breath sounds: Normal breath sounds.  Lymphadenopathy:     Cervical: Cervical adenopathy present.     Left cervical: Deep cervical adenopathy (1/4 inch) present.  Neurological:     General: No focal deficit present.     Mental Status: He is alert and oriented to person, place, and time.  Psychiatric:        Mood and Affect: Mood normal.        Behavior: Behavior normal.    LABORATORY DATA:  I have reviewed the labs as listed.  CBC Latest Ref Rng & Units 12/11/2020 09/11/2019 09/03/2019  WBC 4.0 - 10.5 K/uL 9.1 - 8.2  Hemoglobin 13.0 - 17.0 g/dL 14.4 11.4(L) 14.1  Hematocrit 39.0 - 52.0 % 42.9 35.0(L) 42.9  Platelets 150 - 400 K/uL 261 - 246   CMP Latest Ref Rng & Units 11/21/2020 09/11/2019 09/03/2019  Glucose 70 - 99 mg/dL - 109(H) 124(H)  BUN 8 - 23 mg/dL - 24(H) 26(H)  Creatinine 0.61 - 1.24 mg/dL 1.30(H) 1.06 1.16  Sodium 135 - 145 mmol/L - 136 139  Potassium 3.5 - 5.1 mmol/L - 4.4 4.3  Chloride 98 -  111 mmol/L - 105 106  CO2 22 - 32 mmol/L - 24 26  Calcium 8.9 - 10.3 mg/dL - 8.3(L) 9.2    DIAGNOSTIC IMAGING:  I have independently reviewed the scans and discussed with the patient. IR Gastrostomy Tube  Result Date: 12/11/2020 INDICATION: 75 year old male referred for gastrostomy tube placement with a history of head and neck  carcinoma EXAM: PERC PLACEMENT GASTROSTOMY MEDICATIONS: 2 g Ancef; Antibiotics were administered within 1 hour of the procedure. ANESTHESIA/SEDATION: Versed 1.0 mg IV; Fentanyl 50 mcg IV Moderate Sedation Time:  10 minutes The patient was continuously monitored during the procedure by the interventional radiology nurse under my direct supervision. CONTRAST:  10 cc-administered into the gastric lumen. FLUOROSCOPY TIME:  Fluoroscopy Time: 1 minutes 36 seconds (6 mGy). COMPLICATIONS: None PROCEDURE: Informed written consent was obtained from the patient and the patient's family after a thorough discussion of the procedural risks, benefits and alternatives. All questions were addressed. Maximal Sterile Barrier Technique was utilized including caps, mask, sterile gowns, sterile gloves, sterile drape, hand hygiene and skin antiseptic. A timeout was performed prior to the initiation of the procedure. The epigastrium was prepped with Betadine in a sterile fashion, and a sterile drape was applied covering the operative field. A sterile gown and sterile gloves were used for the procedure. A 5-French orogastric tube is placed under fluoroscopic guidance. Scout imaging of the abdomen confirms barium within the transverse colon. The stomach was distended with gas. Under fluoroscopic guidance, an 18 gauge needle was utilized to puncture the anterior wall of the body of the stomach. An Amplatz wire was advanced through the needle passing a T fastener into the lumen of the stomach. The T fastener was secured for gastropexy. A 9-French sheath was inserted. A snare was advanced through the 9-French sheath. A Britta Mccreedy was advanced through the orogastric tube. It was snared then pulled out the oral cavity, pulling the snare, as well. The leading edge of the gastrostomy was attached to the snare. It was then pulled down the esophagus and out the percutaneous site. Tube secured in place. Contrast was injected. Patient tolerated the  procedure well and remained hemodynamically stable throughout. No complications were encountered and no significant blood loss encountered. IMPRESSION: Status post fluoroscopic placed percutaneous gastrostomy tube, with 20 [place] pull-through. Signed, Dulcy Fanny. Earleen Newport, DO [facility] Radiology Electronically Signed   By: Corrie Mckusick D.O.   On: 12/11/2020 17:25   IR IMAGING GUIDED PORT INSERTION  Result Date: 12/11/2020 INDICATION: 75 year old male with a history of head neck cancer referred for port catheter placement EXAM: IMAGE GUIDED PORT PLACEMENT MEDICATIONS: None ANESTHESIA/SEDATION: Moderate (conscious) sedation was employed during this procedure. A total of Versed 2.0 mg and Fentanyl 50 mcg was administered intravenously. Moderate Sedation Time: 15 minutes. The patient's level of consciousness and vital signs were monitored continuously by radiology nursing throughout the procedure under my direct supervision. FLUOROSCOPY TIME:  0 minutes, 6 seconds (0.2 mGy) COMPLICATIONS: None PROCEDURE: The procedure, risks, benefits, and alternatives were explained to the patient. Questions regarding the procedure were encouraged and answered. The patient understands and consents to the procedure. Ultrasound survey was performed with images stored and sent to PACs. The right neck and chest was prepped with chlorhexidine, and draped in the usual sterile fashion using maximum barrier technique (cap and mask, sterile gown, sterile gloves, large sterile sheet, hand hygiene and cutaneous antiseptic). . Local anesthesia was attained by infiltration with 1% lidocaine without epinephrine. Ultrasound demonstrated patency  of the right internal jugular vein, and this was documented with an image. Under real-time ultrasound guidance, this vein was accessed with a 21 gauge micropuncture needle and image documentation was performed. A small dermatotomy was made at the access site  with an 11 scalpel. A 0.018" wire was advanced into the SVC and used to estimate the length of the internal catheter. The access needle exchanged for a 26F micropuncture vascular sheath. The 0.018" wire was then removed and a 0.035" wire advanced into the IVC. An appropriate location for the subcutaneous reservoir was selected below the clavicle and an incision was made through the skin and underlying soft tissues. The subcutaneous tissues were then dissected using a combination of blunt and sharp surgical technique and a pocket was formed. A single lumen power injectable portacatheter was then tunneled through the subcutaneous tissues from the pocket to the dermatotomy and the port reservoir placed within the subcutaneous pocket. The venous access site was then serially dilated and a peel away vascular sheath placed over the wire. The wire was removed and the port catheter advanced into position under fluoroscopic guidance. The catheter tip is positioned in the cavoatrial junction. This was documented with a spot image. The portacatheter was then tested and found to flush and aspirate well. The port was flushed with saline followed by 100 units/mL heparinized saline. The pocket was then closed in two layers using first subdermal inverted interrupted absorbable sutures followed by a running subcuticular suture. The epidermis was then sealed with Dermabond. The dermatotomy at the venous access site was also seal with Dermabond. Patient tolerated the procedure well and remained hemodynamically stable throughout. No complications encountered and no significant blood loss encountered IMPRESSION: Status post right IJ port catheter Signed, Dulcy Fanny. Dellia Nims, RPVI [facility] Radiology Electronically Signed   By: Corrie Mckusick D.O.   On: 12/11/2020 17:24     ASSESSMENT:  1.  Left tonsillar squamous cell cancer: - Left tonsillar biopsy by Dr. Benjamine Mola on 11/15/2020  consistent with squamous cell carcinoma.  P16 is strongly positive. - PET scan on 12/07/2020-shows 4.1 cm left palatine tonsil mass with SUV 16.  0.8 cm left level 3 lymph node SUV 4.4.  Elongated 0.9 x 1 cm right lower lobe nodule along the azygoesophageal recess has accentuated metabolic activity with SUV 5.8.  This has some solid component and was not visible on chest CT of 04/24/2020.  Differential includes unusual morphology of metastatic disease versus inflammatory process.  Groundglass opacity laterally in the right upper lobe likely inflammatory with SUV 1.4.  0.6 cm right middle lobe nodule not hypermetabolic. - Concurrent chemoradiation therapy started on 01/09/2021.  2.  Social/family history: - He has a retired after working for [employer].  He quit smoking 12 years ago.  He smoked 2 packs/day for 20 years. - Sister had lung cancer.  2 sisters died of lung cancer.  Brother had lung cancer.   PLAN:  1.  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 - We have also reviewed audiogram results from Dr. Deeann Saint office which showed bilateral sensorineural hearing loss.  Hearing on the left side is slightly worse in the last few weeks which could be tumor related. - We discussed treatment plan with weekly cisplatin along with radiation therapy.  We discussed side effects including worsening of hearing loss, peripheral neuropathy, nephrotoxicity among others.  Since his last creatinine  was slightly elevated at 1.3, I will start out with low-dose cisplatin at 30 mg per metered square.  Will increase it to 40 mg per metered square every tolerates well. - Today's labs show creatinine is 1.03 along with normal CBC.  He will receive pre and post hydration to cisplatin. - RTC 1 week for follow-up with repeat labs.  2.  Left neck and head pain: - He reports pain in the left tonsillar area which radiates to the left side of the  neck, angle of jaw and head. - We will start him on hydrocodone 5/325 every 8 hours as needed.  3.  Nutrition: - He lost about 9 pounds in the last 6 months. - He is eating 2 meals per day and is drinking Carnation instant breakfast. - Denies any dysphagia at this time. - He already has feeding tube placed.  He has Osmolite 1.5 cans with him.   Orders placed this encounter:  No orders of the defined types were placed in this encounter.    Derek Rondey, MD [place] [phone]   I, Thana Ates, am acting as a scribe for Dr. Derek Kaedyn.  I, Derek Byan MD, have reviewed the above documentation for accuracy and completeness, and I agree with the above.

## 2021-01-09 ENCOUNTER — Inpatient Hospital Stay (HOSPITAL_COMMUNITY): Payer: Medicare HMO | Attending: Hematology and Oncology | Admitting: Hematology

## 2021-01-09 ENCOUNTER — Other Ambulatory Visit: Payer: Self-pay

## 2021-01-09 ENCOUNTER — Encounter (HOSPITAL_COMMUNITY): Payer: Self-pay | Admitting: Hematology

## 2021-01-09 ENCOUNTER — Inpatient Hospital Stay (HOSPITAL_COMMUNITY): Payer: Medicare HMO

## 2021-01-09 ENCOUNTER — Other Ambulatory Visit: Payer: Self-pay | Admitting: Hematology and Oncology

## 2021-01-09 VITALS — BP 152/63 | HR 60 | Temp 96.9°F | Resp 17

## 2021-01-09 VITALS — BP 151/65 | HR 60 | Temp 97.0°F | Resp 18 | Wt 146.6 lb

## 2021-01-09 DIAGNOSIS — Z87891 Personal history of nicotine dependence: Secondary | ICD-10-CM | POA: Diagnosis not present

## 2021-01-09 DIAGNOSIS — Z5111 Encounter for antineoplastic chemotherapy: Secondary | ICD-10-CM | POA: Diagnosis not present

## 2021-01-09 DIAGNOSIS — Z85828 Personal history of other malignant neoplasm of skin: Secondary | ICD-10-CM | POA: Diagnosis not present

## 2021-01-09 DIAGNOSIS — C099 Malignant neoplasm of tonsil, unspecified: Secondary | ICD-10-CM

## 2021-01-09 DIAGNOSIS — Z79899 Other long term (current) drug therapy: Secondary | ICD-10-CM | POA: Insufficient documentation

## 2021-01-09 DIAGNOSIS — R519 Headache, unspecified: Secondary | ICD-10-CM | POA: Diagnosis not present

## 2021-01-09 DIAGNOSIS — I10 Essential (primary) hypertension: Secondary | ICD-10-CM | POA: Diagnosis not present

## 2021-01-09 DIAGNOSIS — Z85528 Personal history of other malignant neoplasm of kidney: Secondary | ICD-10-CM | POA: Diagnosis not present

## 2021-01-09 DIAGNOSIS — Z905 Acquired absence of kidney: Secondary | ICD-10-CM | POA: Insufficient documentation

## 2021-01-09 DIAGNOSIS — Z8249 Family history of ischemic heart disease and other diseases of the circulatory system: Secondary | ICD-10-CM | POA: Diagnosis not present

## 2021-01-09 DIAGNOSIS — Z801 Family history of malignant neoplasm of trachea, bronchus and lung: Secondary | ICD-10-CM | POA: Diagnosis not present

## 2021-01-09 DIAGNOSIS — M542 Cervicalgia: Secondary | ICD-10-CM | POA: Insufficient documentation

## 2021-01-09 DIAGNOSIS — C098 Malignant neoplasm of overlapping sites of tonsil: Secondary | ICD-10-CM | POA: Diagnosis not present

## 2021-01-09 DIAGNOSIS — E785 Hyperlipidemia, unspecified: Secondary | ICD-10-CM | POA: Diagnosis not present

## 2021-01-09 DIAGNOSIS — C770 Secondary and unspecified malignant neoplasm of lymph nodes of head, face and neck: Secondary | ICD-10-CM | POA: Diagnosis not present

## 2021-01-09 LAB — MAGNESIUM: Magnesium: 2.1 mg/dL (ref 1.7–2.4)

## 2021-01-09 LAB — COMPREHENSIVE METABOLIC PANEL
ALT: 27 U/L (ref 0–44)
AST: 28 U/L (ref 15–41)
Albumin: 4 g/dL (ref 3.5–5.0)
Alkaline Phosphatase: 102 U/L (ref 38–126)
Anion gap: 8 (ref 5–15)
BUN: 21 mg/dL (ref 8–23)
CO2: 28 mmol/L (ref 22–32)
Calcium: 9.4 mg/dL (ref 8.9–10.3)
Chloride: 101 mmol/L (ref 98–111)
Creatinine, Ser: 1.03 mg/dL (ref 0.61–1.24)
GFR, Estimated: >60 mL/min (ref >60)
Glucose, Bld: 119 mg/dL — ABNORMAL HIGH (ref 70–99)
Potassium: 4.2 mmol/L (ref 3.5–5.1)
Sodium: 137 mmol/L (ref 135–145)
Total Bilirubin: 0.8 mg/dL (ref 0.3–1.2)
Total Protein: 7.2 g/dL (ref 6.5–8.1)

## 2021-01-09 LAB — CBC WITH DIFFERENTIAL/PLATELET
Abs Immature Granulocytes: 0.03 10*3/uL (ref 0.00–0.07)
Basophils Absolute: 0.1 10*3/uL (ref 0.0–0.1)
Basophils Relative: 1 %
Eosinophils Absolute: 0.5 10*3/uL (ref 0.0–0.5)
Eosinophils Relative: 6 %
HCT: 41.5 % (ref 39.0–52.0)
Hemoglobin: 13.8 g/dL (ref 13.0–17.0)
Immature Granulocytes: 0 %
Lymphocytes Relative: 16 %
Lymphs Abs: 1.4 10*3/uL (ref 0.7–4.0)
MCH: 32.7 pg (ref 26.0–34.0)
MCHC: 33.3 g/dL (ref 30.0–36.0)
MCV: 98.3 fL (ref 80.0–100.0)
Monocytes Absolute: 0.8 10*3/uL (ref 0.1–1.0)
Monocytes Relative: 9 %
Neutro Abs: 5.6 10*3/uL (ref 1.7–7.7)
Neutrophils Relative %: 68 %
Platelets: 208 10*3/uL (ref 150–400)
RBC: 4.22 MIL/uL (ref 4.22–5.81)
RDW: 12.6 % (ref 11.5–15.5)
WBC: 8.5 10*3/uL (ref 4.0–10.5)
nRBC: 0 % (ref 0.0–0.2)

## 2021-01-09 MED ORDER — PALONOSETRON HCL INJECTION 0.25 MG/5ML
0.2500 mg | Freq: Once | INTRAVENOUS | Status: AC
  Administered 2021-01-09: 0.25 mg via INTRAVENOUS
  Filled 2021-01-09: qty 5

## 2021-01-09 MED ORDER — SODIUM CHLORIDE 0.9 % IV SOLN: Freq: Once | INTRAVENOUS | Status: AC

## 2021-01-09 MED ORDER — MAGNESIUM SULFATE 2 GM/50ML IV SOLN
2.0000 g | Freq: Once | INTRAVENOUS | Status: AC
  Administered 2021-01-09: 2 g via INTRAVENOUS
  Filled 2021-01-09: qty 50

## 2021-01-09 MED ORDER — SODIUM CHLORIDE 0.9 % IV SOLN
10.0000 mg | Freq: Once | INTRAVENOUS | Status: AC
  Administered 2021-01-09: 10 mg via INTRAVENOUS
  Filled 2021-01-09: qty 10

## 2021-01-09 MED ORDER — HEPARIN SOD (PORK) LOCK FLUSH 100 UNIT/ML IV SOLN
500.0000 [IU] | Freq: Once | INTRAVENOUS | Status: AC | PRN
  Administered 2021-01-09: 500 [IU]

## 2021-01-09 MED ORDER — HYDROCODONE-ACETAMINOPHEN 5-325 MG PO TABS: 1.0000 | ORAL_TABLET | Freq: Three times a day (TID) | ORAL | 0 refills | Status: DC | PRN

## 2021-01-09 MED ORDER — SODIUM CHLORIDE 0.9 % IV SOLN
150.0000 mg | Freq: Once | INTRAVENOUS | Status: AC
  Administered 2021-01-09: 150 mg via INTRAVENOUS
  Filled 2021-01-09: qty 150

## 2021-01-09 MED ORDER — POTASSIUM CHLORIDE IN NACL 20-0.9 MEQ/L-% IV SOLN
Freq: Once | INTRAVENOUS | Status: AC
  Filled 2021-01-09: qty 1000

## 2021-01-09 MED ORDER — SODIUM CHLORIDE 0.9 % IV SOLN
30.0000 mg/m2 | Freq: Once | INTRAVENOUS | Status: AC
  Administered 2021-01-09: 53 mg via INTRAVENOUS
  Filled 2021-01-09: qty 53

## 2021-01-09 MED ORDER — SODIUM CHLORIDE 0.9% FLUSH
10.0000 mL | INTRAVENOUS | Status: DC | PRN
Start: 2021-01-09 — End: 2021-01-09
  Administered 2021-01-09: 10 mL

## 2021-01-09 NOTE — Progress Notes (Signed)
Patient presents today for D1C1 Cisplatin infusion per providers order.  Vital signs and labs within parameters for treatment.  No new complaints at this time.  Patient voided 400cc clear straw colored urine.  Cisplatin infusion given today per MD orders.  Stable during infusion without adverse affects.  Vital signs stable.  No complaints at this time.  Discharge from clinic ambulatory in stable condition.  Alert and oriented X 3.  Follow up with [facility] as scheduled.

## 2021-01-09 NOTE — Patient Instructions (Addendum)
[place] at [facility] Discharge Instructions  You were seen today by Dr. Delton Coombes. He went over your recent results, and you received your treatment. You have been prescribed Hydrocodone: take every 8 hours as needed for pain. Stay hydrated by drinking 2 - 3 liters of water a day. Dr. Delton Coombes will see you back in 1 week for labs and follow up.   Thank you for choosing [place] at [facility] to provide your oncology and hematology care.  To afford each patient quality time with our provider, please arrive at least 15 minutes before your scheduled appointment time.   If you have a lab appointment with the [place] please come in thru the Main Entrance and check in at the main information desk  You need to re-schedule your appointment should you arrive 10 or more minutes late.  We strive to give you quality time with our providers, and arriving late affects you and other patients whose appointments are after yours.  Also, if you no show three or more times for appointments you may be dismissed from the clinic at the providers discretion.     Again, thank you for choosing [facility].  Our hope is that these requests will decrease the amount of time that you wait before being seen by our physicians.       _____________________________________________________________  Should you have questions after your visit to [facility], please contact our office at [phone] between the hours of 8:00 a.m. and 4:30 p.m.  Voicemails left after 4:00 p.m. will not be returned until the following business day.  For prescription refill requests, have your pharmacy contact our office and allow 72 hours.    Cancer Center Support Programs:   > Cancer Support Group  2nd Tuesday of the month 1pm-2pm, Journey Room

## 2021-01-10 DIAGNOSIS — E785 Hyperlipidemia, unspecified: Secondary | ICD-10-CM | POA: Diagnosis not present

## 2021-01-10 DIAGNOSIS — I10 Essential (primary) hypertension: Secondary | ICD-10-CM | POA: Diagnosis not present

## 2021-01-10 DIAGNOSIS — Z85528 Personal history of other malignant neoplasm of kidney: Secondary | ICD-10-CM | POA: Diagnosis not present

## 2021-01-10 DIAGNOSIS — Z87891 Personal history of nicotine dependence: Secondary | ICD-10-CM | POA: Diagnosis not present

## 2021-01-10 DIAGNOSIS — C770 Secondary and unspecified malignant neoplasm of lymph nodes of head, face and neck: Secondary | ICD-10-CM | POA: Diagnosis not present

## 2021-01-10 DIAGNOSIS — C099 Malignant neoplasm of tonsil, unspecified: Secondary | ICD-10-CM | POA: Diagnosis not present

## 2021-01-10 DIAGNOSIS — Z905 Acquired absence of kidney: Secondary | ICD-10-CM | POA: Diagnosis not present

## 2021-01-10 DIAGNOSIS — Z8249 Family history of ischemic heart disease and other diseases of the circulatory system: Secondary | ICD-10-CM | POA: Diagnosis not present

## 2021-01-10 DIAGNOSIS — C098 Malignant neoplasm of overlapping sites of tonsil: Secondary | ICD-10-CM | POA: Diagnosis not present

## 2021-01-11 ENCOUNTER — Telehealth (HOSPITAL_COMMUNITY): Payer: Self-pay | Admitting: Dietician

## 2021-01-11 ENCOUNTER — Encounter (HOSPITAL_COMMUNITY): Payer: Medicare HMO | Admitting: Dietician

## 2021-01-11 DIAGNOSIS — E785 Hyperlipidemia, unspecified: Secondary | ICD-10-CM | POA: Diagnosis not present

## 2021-01-11 DIAGNOSIS — I10 Essential (primary) hypertension: Secondary | ICD-10-CM | POA: Diagnosis not present

## 2021-01-11 DIAGNOSIS — C770 Secondary and unspecified malignant neoplasm of lymph nodes of head, face and neck: Secondary | ICD-10-CM | POA: Diagnosis not present

## 2021-01-11 DIAGNOSIS — Z8249 Family history of ischemic heart disease and other diseases of the circulatory system: Secondary | ICD-10-CM | POA: Diagnosis not present

## 2021-01-11 DIAGNOSIS — C098 Malignant neoplasm of overlapping sites of tonsil: Secondary | ICD-10-CM | POA: Diagnosis not present

## 2021-01-11 DIAGNOSIS — C099 Malignant neoplasm of tonsil, unspecified: Secondary | ICD-10-CM | POA: Diagnosis not present

## 2021-01-11 DIAGNOSIS — Z905 Acquired absence of kidney: Secondary | ICD-10-CM | POA: Diagnosis not present

## 2021-01-11 DIAGNOSIS — Z85528 Personal history of other malignant neoplasm of kidney: Secondary | ICD-10-CM | POA: Diagnosis not present

## 2021-01-11 DIAGNOSIS — Z87891 Personal history of nicotine dependence: Secondary | ICD-10-CM | POA: Diagnosis not present

## 2021-01-11 NOTE — Telephone Encounter (Signed)
Nutrition Follow-up:   Patient receiving concurrent chemoradiation therapy with weekly cisplatin for tonsil cancer.   Spoke with patient via telephone this morning. Patient reports doing well after first chemotherapy, he denies nutrition impact symptoms. He reports good appetite and is eating well. Reports toast with butter and raspberry jam this morning. Yesterday he ate waffles, ham and egg sandwich, pot roast, potatoes, salad, CIB with whole milk, ice cream. Patient is not using feeding tube at this time. He is flushing tube three times daily. Patient is drinking 2 bottles of water, 2 (16 oz) lemonades, coffee.  Medications: Hydrocodone, Magic mouthwash  Labs: 9/6 - Glucose 119  Anthropometrics: Last weight 146 lb 9.6 oz on 9/6 decreased 2.7% from 150 lb on 8/8. This is insignificant for time frame  7/27 - 146 lb 11.2 oz  Estimated Energy Needs  Kcals: 2000-2200 Protein: 93-106 Fluid: 2.1 L  NUTRITION DIAGNOSIS: Predicted suboptimal intake ongoing    INTERVENTION:  Continue eating high calorie, high protein foods orally as able Encouraged bedtime snack Continue drinking CIB with whole milk daily Continue baking soda salt water rinses Patient will start giving 1 carton Osmolite 1.5 via tube daily to promote weight stability Patient has contact information    MONITORING, EVALUATION, GOAL: Weight trends, intake, tube feeding   NEXT VISIT: Thursday September 15 via telephone (pt aware)

## 2021-01-12 DIAGNOSIS — Z87891 Personal history of nicotine dependence: Secondary | ICD-10-CM | POA: Diagnosis not present

## 2021-01-12 DIAGNOSIS — C770 Secondary and unspecified malignant neoplasm of lymph nodes of head, face and neck: Secondary | ICD-10-CM | POA: Diagnosis not present

## 2021-01-12 DIAGNOSIS — C098 Malignant neoplasm of overlapping sites of tonsil: Secondary | ICD-10-CM | POA: Diagnosis not present

## 2021-01-12 DIAGNOSIS — Z8249 Family history of ischemic heart disease and other diseases of the circulatory system: Secondary | ICD-10-CM | POA: Diagnosis not present

## 2021-01-12 DIAGNOSIS — Z905 Acquired absence of kidney: Secondary | ICD-10-CM | POA: Diagnosis not present

## 2021-01-12 DIAGNOSIS — C099 Malignant neoplasm of tonsil, unspecified: Secondary | ICD-10-CM | POA: Diagnosis not present

## 2021-01-12 DIAGNOSIS — E785 Hyperlipidemia, unspecified: Secondary | ICD-10-CM | POA: Diagnosis not present

## 2021-01-12 DIAGNOSIS — Z85528 Personal history of other malignant neoplasm of kidney: Secondary | ICD-10-CM | POA: Diagnosis not present

## 2021-01-12 DIAGNOSIS — I10 Essential (primary) hypertension: Secondary | ICD-10-CM | POA: Diagnosis not present

## 2021-01-15 DIAGNOSIS — C098 Malignant neoplasm of overlapping sites of tonsil: Secondary | ICD-10-CM | POA: Diagnosis not present

## 2021-01-15 DIAGNOSIS — Z905 Acquired absence of kidney: Secondary | ICD-10-CM | POA: Diagnosis not present

## 2021-01-15 DIAGNOSIS — Z87891 Personal history of nicotine dependence: Secondary | ICD-10-CM | POA: Diagnosis not present

## 2021-01-15 DIAGNOSIS — C770 Secondary and unspecified malignant neoplasm of lymph nodes of head, face and neck: Secondary | ICD-10-CM | POA: Diagnosis not present

## 2021-01-15 DIAGNOSIS — Z8249 Family history of ischemic heart disease and other diseases of the circulatory system: Secondary | ICD-10-CM | POA: Diagnosis not present

## 2021-01-15 DIAGNOSIS — C099 Malignant neoplasm of tonsil, unspecified: Secondary | ICD-10-CM | POA: Diagnosis not present

## 2021-01-15 DIAGNOSIS — I10 Essential (primary) hypertension: Secondary | ICD-10-CM | POA: Diagnosis not present

## 2021-01-15 DIAGNOSIS — Z85528 Personal history of other malignant neoplasm of kidney: Secondary | ICD-10-CM | POA: Diagnosis not present

## 2021-01-15 DIAGNOSIS — E785 Hyperlipidemia, unspecified: Secondary | ICD-10-CM | POA: Diagnosis not present

## 2021-01-15 NOTE — Progress Notes (Signed)
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 There is preferential submucosal growth and the mass is indistinguishable from the left prevertebral space and lower left stylopharyngeaus. No convincing adenopathy. 2. Mixed density right upper lobe nodule, attention on anticipated PET.   11/29/2020 Initial Diagnosis   Tonsil cancer ([place])   11/29/2020 Cancer Staging   Staging form: Pharynx - HPV-Mediated Oropharynx, AJCC 8th Edition - Clinical stage from 11/29/2020: cT4, p16+ - Signed by [name], MD on 11/29/2020 Stage prefix: Initial diagnosis   01/09/2021 -  Chemotherapy    Patient is on Treatment Plan: HEAD/NECK CISPLATIN Q7D         CANCER STAGING: Cancer Staging Tonsil cancer [facility]) Staging form: Pharynx - HPV-Mediated Oropharynx, AJCC 8th Edition - Clinical stage from 11/29/2020: cT4, p16+ - Signed by [name], MD on 11/29/2020   INTERVAL HISTORY:  Mr. Douglas [name], a 75 y.o. male, returns for routine follow-up and consideration for next cycle of chemotherapy. Douglas [name] was last seen on 01/09/2021.  Due for cycle #2 of Cisplatin today.   Overall, he tells me he has been feeling pretty well. He reports some numbness in his tongue, and his swallowing an  ROM of his jaw has improved. He reports pain in his left ear and headaches on his left side, but he denies tinnitus. He is eating 2 meals along with 1 carnation instant breakfast daily. He has been using 2 cans of tube feed daily since Sunday. He denies nausea and vomiting, but reports one episode of diarrhea. He drinks over 64 ounces of fluid daily.   Overall, he feels ready for next cycle of chemo today.   REVIEW OF SYSTEMS:  Review of Systems  Constitutional:  Negative for appetite change (60%) and fatigue (75%).  HENT:   Positive for trouble swallowing. Negative for tinnitus.        L earache  Gastrointestinal:  Positive for diarrhea. Negative for nausea.  Neurological:  Positive for headaches (L side) and numbness (tongue).  All other systems reviewed and are negative.  PAST MEDICAL/SURGICAL HISTORY:  Past Medical History:  Diagnosis Date   Arthritis    Cancer ([place])    Skin, and Kidney   Hypertension    Pre-diabetes    Skin cancer    Past Surgical History:  Procedure Laterality Date   COLONOSCOPY     HERNIA REPAIR     IR GASTROSTOMY TUBE MOD SED  12/11/2020   IR IMAGING GUIDED PORT INSERTION  12/11/2020   LAPAROSCOPIC PARTIAL NEPHRECTOMY Left    PARTIAL NEPHRECTOMY Left 2009   REVERSE SHOULDER ARTHROPLASTY Right 09/10/2019   Procedure: REVERSE SHOULDER ARTHROPLASTY;  Surgeon: [name], MD;  Location: [facility];  Service: Orthopedics;  Laterality: Right;  interscalene block  SKIN SURGERY      SOCIAL HISTORY:  Social History   Socioeconomic History   Marital status: Widowed    Spouse name: Not on file   Number of children: Not on file   Years of education: Not on file   Highest education level: Not on file  Occupational History    Employer: [employer]  Tobacco Use   Smoking status: Former    Packs/day: 1.00    Years: 20.00    Pack years: 20.00    Types: Cigarettes    Quit date: 2010    Years since quitting: 12.7   Smokeless tobacco: Never  Vaping Use   Vaping  Use: Never used  Substance and Sexual Activity   Alcohol use: Yes    Alcohol/week: 1.0 standard drink    Types: 1 Cans of beer per week    Comment: occ   Drug use: Never   Sexual activity: Not on file  Other Topics Concern   Not on file  Social History Narrative   Not on file   Social Determinants of Health   Financial Resource Strain: Low Risk    Difficulty of Paying Living Expenses: Not hard at all  Food Insecurity: No Food Insecurity   Worried About Charity fundraiser in the Last Year: Never true   [place] in the Last Year: Never true  Transportation Needs: No Transportation Needs   Lack of Transportation (Medical): No   Lack of Transportation (Non-Medical): No  Physical Activity: Sufficiently Active   Days of Exercise per Week: 7 days   Minutes of Exercise per Session: 30 min  Stress: No Stress Concern Present   Feeling of Stress : Not at all  Social Connections: Socially Isolated   Frequency of Communication with Friends and Family: More than three times a week   Frequency of Social Gatherings with Friends and Family: More than three times a week   Attends Religious Services: Never   Marine scientist or Organizations: No   Attends Archivist Meetings: Never   Marital Status: Widowed  Human resources officer Violence: Not At Risk   Fear of Current or Ex-Partner: No   Emotionally Abused: No   Physically Abused: No   Sexually Abused: No    FAMILY HISTORY:  Family History  Problem Relation Age of Onset   Diabetes Mother    Pulmonary embolism Father    Lung cancer Sister    Lung cancer Sister    Diabetes Brother    Lung cancer Brother     CURRENT MEDICATIONS:  Current Outpatient Medications  Medication Sig Dispense Refill   acetaminophen-codeine (TYLENOL #3) 300-30 MG tablet      amLODipine (NORVASC) 10 MG tablet Take 10 mg by mouth daily.     atenolol (TENORMIN) 25 MG tablet Take 25 mg by mouth daily.     cholecalciferol (VITAMIN D3) 25  MCG (1000 UNIT) tablet Take 1,000 Units by mouth daily.     CISPLATIN IV Inject 40 mg/m2 into the vein once a week.     diclofenac (VOLTAREN) 75 MG EC tablet Take 75 mg by mouth 2 (two) times daily.     HYDROcodone-acetaminophen (NORCO) 5-325 MG tablet Take 1 tablet by mouth every 8 (eight) hours as needed for moderate pain. 30 tablet 0   lidocaine-prilocaine (EMLA) cream Apply to affected area once 30 g 3   lisinopril (ZESTRIL) 40 MG tablet Take 40 mg by mouth daily.  magic mouthwash w/lidocaine SOLN Take 5 mLs by mouth 4 (four) times daily as needed for mouth pain. 250 mL 3   Menaquinone-7 (VITAMIN K2 PO) Take 1 tablet by mouth daily.     Multiple Vitamins-Minerals (MULTIVITAMIN WITH MINERALS) tablet Take 1 tablet by mouth daily.     Nutritional Supplements (FEEDING SUPPLEMENT, OSMOLITE 1.5 CAL,) LIQD Give 6 cartons Osmolite 1.5 via tube split over 4 feedings/day. Flush tube with 60 ml water before and after each bolus feeding. Drink by mouth or give via tube additional 2.5 c fluids/day. This provides 2130 kcal, 89 grams protein, 2159 ml total water. Meets 100% needs.  0   simvastatin (ZOCOR) 20 MG tablet Take 20 mg by mouth daily.     tamsulosin (FLOMAX) 0.4 MG CAPS capsule Take 0.8 mg by mouth daily.     zinc gluconate 50 MG tablet Take 50 mg by mouth daily.     ondansetron (ZOFRAN) 8 MG tablet Take 1 tablet (8 mg total) by mouth 2 (two) times daily as needed. Start on the third day after cisplatin chemotherapy. (Patient not taking: Reported on 01/16/2021) 30 tablet 1   prochlorperazine (COMPAZINE) 10 MG tablet Take 1 tablet (10 mg total) by mouth every 6 (six) hours as needed (Nausea or vomiting). (Patient not taking: Reported on 01/16/2021) 30 tablet 1   No current facility-administered medications for this visit.    ALLERGIES:  No Known Allergies  PHYSICAL EXAM:  Performance status (ECOG): 0 - Asymptomatic  Vitals:   01/16/21 0838  BP: (!) 137/53  Pulse: 64  Resp: 18  Temp: (!)  96.7 F (35.9 C)  SpO2: 99%   Wt Readings from Last 3 Encounters:  01/16/21 144 lb 13.5 oz (65.7 kg)  01/09/21 146 lb 9.6 oz (66.5 kg)  12/11/20 150 lb (68 kg)   Physical Exam Vitals reviewed.  Constitutional:      Appearance: Normal appearance.  Cardiovascular:     Rate and Rhythm: Normal rate and regular rhythm.     Pulses: Normal pulses.     Heart sounds: Normal heart sounds.  Pulmonary:     Effort: Pulmonary effort is normal.     Breath sounds: Normal breath sounds.  Neurological:     General: No focal deficit present.     Mental Status: He is alert and oriented to person, place, and time.  Psychiatric:        Mood and Affect: Mood normal.        Behavior: Behavior normal.    LABORATORY DATA:  I have reviewed the labs as listed.  CBC Latest Ref Rng & Units 01/16/2021 01/09/2021 12/11/2020  WBC 4.0 - 10.5 K/uL 9.9 8.5 9.1  Hemoglobin 13.0 - 17.0 g/dL 12.7(L) 13.8 14.4  Hematocrit 39.0 - 52.0 % 39.3 41.5 42.9  Platelets 150 - 400 K/uL 191 208 261   CMP Latest Ref Rng & Units 01/16/2021 01/09/2021 11/21/2020  Glucose 70 - 99 mg/dL 110(H) 119(H) -  BUN 8 - 23 mg/dL 26(H) 21 -  Creatinine 0.61 - 1.24 mg/dL 1.16 1.03 1.30(H)  Sodium 135 - 145 mmol/L 140 137 -  Potassium 3.5 - 5.1 mmol/L 4.2 4.2 -  Chloride 98 - 111 mmol/L 101 101 -  CO2 22 - 32 mmol/L 30 28 -  Calcium 8.9 - 10.3 mg/dL 9.2 9.4 -  Total Protein 6.5 - 8.1 g/dL 6.6 7.2 -  Total Bilirubin 0.3 - 1.2 mg/dL 0.6 0.8 -  Alkaline Phos 38 - 126 U/L 91 102 -  AST 15 - 41 U/L 23 28 -  ALT 0 - 44 U/L 26 27 -    DIAGNOSTIC IMAGING:  I have independently reviewed the scans and discussed with the patient. No results found.   ASSESSMENT:  1.  Left tonsillar squamous cell cancer: - Left tonsillar biopsy by Dr. Benjamine Mola on 11/15/2020 consistent with squamous cell carcinoma.  P16 is strongly positive. - PET scan on 12/07/2020-shows 4.1 cm left palatine tonsil mass with SUV 16.  0.8 cm left level 3 lymph node SUV 4.4.  Elongated  0.9 x 1 cm right lower lobe nodule along the azygoesophageal recess has accentuated metabolic activity with SUV 5.8.  This has some solid component and was not visible on chest CT of 04/24/2020.  Differential includes unusual morphology of metastatic disease versus inflammatory process.  Groundglass opacity laterally in the right upper lobe likely inflammatory with SUV 1.4.  0.6 cm right middle lobe nodule not hypermetabolic. - Concurrent chemoradiation therapy started on 01/09/2021. 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  Hearing on the left side is slightly worse due to tumor.  2.  Social/family history: - He has a retired after working for [employer].  He quit smoking 12 years ago.  He smoked 2 packs/day for 20 years. - Sister had lung cancer.  2 sisters died of lung cancer.  Brother had lung cancer.   PLAN:  1.  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 - He had diarrhea for 1 day.  Denies any nausea or vomiting.  Denies any tingling or numbness extremities.  Hearing has not deteriorated yet. - Labs reviewed by me from today shows creatinine 1.16.  Magnesium and potassium are within normal limits.  LFTs were normal.  CBC shows adequate white count and platelet count. - We will proceed with treatment with pre and post hydration.  Continue 1600 ounces of fluid daily. - RTC 1 week with labs and treatment.  2.  Left ear and headache: - He reports slight improvement of left ear pain since treatment started last week. - Continue hydrocodone as needed.  He took about 3 pills in the last 1 week.  3.  Nutrition: - He lost 2 pounds in the last 1 week. - He started about 1 can of Osmolite last week and increased it to 2 cans daily on 01/13/2021.   Orders placed this encounter:  No orders of the defined types were placed in this  encounter.    Derek Carols, MD [facility] [phone]   I, Thana Ates, am acting as a scribe for Dr. Derek Kamare.  I, Derek Shamal MD, have reviewed the above documentation for accuracy and completeness, and I agree with the above.

## 2021-01-16 ENCOUNTER — Inpatient Hospital Stay (HOSPITAL_COMMUNITY): Payer: Medicare HMO | Admitting: Hematology

## 2021-01-16 ENCOUNTER — Inpatient Hospital Stay (HOSPITAL_COMMUNITY): Payer: Medicare HMO

## 2021-01-16 ENCOUNTER — Ambulatory Visit (HOSPITAL_COMMUNITY): Payer: Medicare HMO | Admitting: Speech Pathology

## 2021-01-16 ENCOUNTER — Other Ambulatory Visit: Payer: Self-pay

## 2021-01-16 VITALS — BP 137/53 | HR 64 | Temp 96.7°F | Resp 18 | Wt 144.8 lb

## 2021-01-16 VITALS — BP 148/67 | HR 60 | Temp 96.9°F | Resp 18

## 2021-01-16 DIAGNOSIS — I10 Essential (primary) hypertension: Secondary | ICD-10-CM | POA: Diagnosis not present

## 2021-01-16 DIAGNOSIS — Z85528 Personal history of other malignant neoplasm of kidney: Secondary | ICD-10-CM | POA: Diagnosis not present

## 2021-01-16 DIAGNOSIS — Z905 Acquired absence of kidney: Secondary | ICD-10-CM | POA: Diagnosis not present

## 2021-01-16 DIAGNOSIS — Z85828 Personal history of other malignant neoplasm of skin: Secondary | ICD-10-CM

## 2021-01-16 DIAGNOSIS — C099 Malignant neoplasm of tonsil, unspecified: Secondary | ICD-10-CM

## 2021-01-16 DIAGNOSIS — M542 Cervicalgia: Secondary | ICD-10-CM | POA: Diagnosis not present

## 2021-01-16 DIAGNOSIS — C098 Malignant neoplasm of overlapping sites of tonsil: Secondary | ICD-10-CM | POA: Diagnosis not present

## 2021-01-16 DIAGNOSIS — Z87891 Personal history of nicotine dependence: Secondary | ICD-10-CM | POA: Diagnosis not present

## 2021-01-16 DIAGNOSIS — Z5111 Encounter for antineoplastic chemotherapy: Secondary | ICD-10-CM | POA: Diagnosis not present

## 2021-01-16 DIAGNOSIS — Z79899 Other long term (current) drug therapy: Secondary | ICD-10-CM | POA: Diagnosis not present

## 2021-01-16 DIAGNOSIS — C770 Secondary and unspecified malignant neoplasm of lymph nodes of head, face and neck: Secondary | ICD-10-CM | POA: Diagnosis not present

## 2021-01-16 DIAGNOSIS — Z8249 Family history of ischemic heart disease and other diseases of the circulatory system: Secondary | ICD-10-CM | POA: Diagnosis not present

## 2021-01-16 DIAGNOSIS — Z801 Family history of malignant neoplasm of trachea, bronchus and lung: Secondary | ICD-10-CM | POA: Diagnosis not present

## 2021-01-16 DIAGNOSIS — R519 Headache, unspecified: Secondary | ICD-10-CM | POA: Diagnosis not present

## 2021-01-16 DIAGNOSIS — E785 Hyperlipidemia, unspecified: Secondary | ICD-10-CM | POA: Diagnosis not present

## 2021-01-16 LAB — MAGNESIUM: Magnesium: 1.8 mg/dL (ref 1.7–2.4)

## 2021-01-16 LAB — CBC WITH DIFFERENTIAL/PLATELET
Abs Immature Granulocytes: 0.07 10*3/uL (ref 0.00–0.07)
Basophils Absolute: 0.1 10*3/uL (ref 0.0–0.1)
Basophils Relative: 1 %
Eosinophils Absolute: 0.7 10*3/uL — ABNORMAL HIGH (ref 0.0–0.5)
Eosinophils Relative: 7 %
HCT: 39.3 % (ref 39.0–52.0)
Hemoglobin: 12.7 g/dL — ABNORMAL LOW (ref 13.0–17.0)
Immature Granulocytes: 1 %
Lymphocytes Relative: 13 %
Lymphs Abs: 1.3 10*3/uL (ref 0.7–4.0)
MCH: 31.9 pg (ref 26.0–34.0)
MCHC: 32.3 g/dL (ref 30.0–36.0)
MCV: 98.7 fL (ref 80.0–100.0)
Monocytes Absolute: 1 10*3/uL (ref 0.1–1.0)
Monocytes Relative: 10 %
Neutro Abs: 6.7 10*3/uL (ref 1.7–7.7)
Neutrophils Relative %: 68 %
Platelets: 191 10*3/uL (ref 150–400)
RBC: 3.98 MIL/uL — ABNORMAL LOW (ref 4.22–5.81)
RDW: 12.7 % (ref 11.5–15.5)
WBC: 9.9 10*3/uL (ref 4.0–10.5)
nRBC: 0 % (ref 0.0–0.2)

## 2021-01-16 LAB — COMPREHENSIVE METABOLIC PANEL
ALT: 26 U/L (ref 0–44)
AST: 23 U/L (ref 15–41)
Albumin: 3.7 g/dL (ref 3.5–5.0)
Alkaline Phosphatase: 91 U/L (ref 38–126)
Anion gap: 9 (ref 5–15)
BUN: 26 mg/dL — ABNORMAL HIGH (ref 8–23)
CO2: 30 mmol/L (ref 22–32)
Calcium: 9.2 mg/dL (ref 8.9–10.3)
Chloride: 101 mmol/L (ref 98–111)
Creatinine, Ser: 1.16 mg/dL (ref 0.61–1.24)
GFR, Estimated: >60 mL/min (ref >60)
Glucose, Bld: 110 mg/dL — ABNORMAL HIGH (ref 70–99)
Potassium: 4.2 mmol/L (ref 3.5–5.1)
Sodium: 140 mmol/L (ref 135–145)
Total Bilirubin: 0.6 mg/dL (ref 0.3–1.2)
Total Protein: 6.6 g/dL (ref 6.5–8.1)

## 2021-01-16 MED ORDER — SODIUM CHLORIDE 0.9 % IV SOLN
40.0000 mg/m2 | Freq: Once | INTRAVENOUS | Status: AC
  Administered 2021-01-16: 71 mg via INTRAVENOUS
  Filled 2021-01-16: qty 71

## 2021-01-16 MED ORDER — SODIUM CHLORIDE 0.9% FLUSH
10.0000 mL | INTRAVENOUS | Status: DC | PRN
  Administered 2021-01-16: 10 mL

## 2021-01-16 MED ORDER — SODIUM CHLORIDE 0.9 % IV SOLN
150.0000 mg | Freq: Once | INTRAVENOUS | Status: AC
  Administered 2021-01-16: 150 mg via INTRAVENOUS
  Filled 2021-01-16: qty 150

## 2021-01-16 MED ORDER — SODIUM CHLORIDE 0.9 % IV SOLN
10.0000 mg | Freq: Once | INTRAVENOUS | Status: AC
  Administered 2021-01-16: 10 mg via INTRAVENOUS
  Filled 2021-01-16: qty 10

## 2021-01-16 MED ORDER — SODIUM CHLORIDE 0.9 % IV SOLN: Freq: Once | INTRAVENOUS | Status: AC

## 2021-01-16 MED ORDER — PALONOSETRON HCL INJECTION 0.25 MG/5ML
0.2500 mg | Freq: Once | INTRAVENOUS | Status: AC
  Administered 2021-01-16: 0.25 mg via INTRAVENOUS
  Filled 2021-01-16: qty 5

## 2021-01-16 MED ORDER — POTASSIUM CHLORIDE IN NACL 20-0.9 MEQ/L-% IV SOLN
Freq: Once | INTRAVENOUS | Status: AC
  Filled 2021-01-16: qty 1000

## 2021-01-16 MED ORDER — MAGNESIUM SULFATE 2 GM/50ML IV SOLN
2.0000 g | Freq: Once | INTRAVENOUS | Status: AC
Start: 2021-01-16 — End: 2021-01-16
  Administered 2021-01-16: 2 g via INTRAVENOUS
  Filled 2021-01-16: qty 50

## 2021-01-16 MED ORDER — HEPARIN SOD (PORK) LOCK FLUSH 100 UNIT/ML IV SOLN
500.0000 [IU] | Freq: Once | INTRAVENOUS | Status: AC | PRN
  Administered 2021-01-16: 500 [IU]

## 2021-01-16 NOTE — Progress Notes (Signed)
Patients port flushed without difficulty.  Good blood return noted with no bruising or swelling noted at site.  Stable during access and blood draw.  Patient to remain accessed for treatment. 

## 2021-01-16 NOTE — Progress Notes (Signed)
Patient presents today for treatment and follow up visit with Dr. Delton Coombes. Vital signs and labs are within parameters for treatment and reviewed by MD. [facility] reviewed.    Patient voided 115 mls .   Treatment given today per MD orders. Tolerated infusion without adverse affects. Vital signs stable. No complaints at this time. Discharged from clinic ambulatory in stable condition. Alert and oriented x 3. F/U with [facility] as scheduled.

## 2021-01-16 NOTE — Patient Instructions (Signed)
[facility] Discharge Instructions  You were seen today by Dr. Katragadda. He went over your recent results, and you received your treatment. Dr. Katragadda will see you back in 1 week for labs and follow up.   Thank you for choosing  Cancer Center at [facility] to provide your oncology and hematology care.  To afford each patient quality time with our provider, please arrive at least 15 minutes before your scheduled appointment time.   If you have a lab appointment with the Cancer Center please come in thru the Main Entrance and check in at the main information desk  You need to re-schedule your appointment should you arrive 10 or more minutes late.  We strive to give you quality time with our providers, and arriving late affects you and other patients whose appointments are after yours.  Also, if you no show three or more times for appointments you may be dismissed from the clinic at the providers discretion.     Again, thank you for choosing [facility].  Our hope is that these requests will decrease the amount of time that you wait before being seen by our physicians.       _____________________________________________________________  Should you have questions after your visit to [facility], please contact our office at [phone] between the hours of 8:00 a.m. and 4:30 p.m.  Voicemails left after 4:00 p.m. will not be returned until the following business day.  For prescription refill requests, have your pharmacy contact our office and allow 72 hours.    Cancer Center Support Programs:   > Cancer Support Group  2nd Tuesday of the month 1pm-2pm, Journey Room    

## 2021-01-16 NOTE — Progress Notes (Signed)
Patient has been examined, vital signs and labs have been reviewed by Dr. Katragadda. ANC, Creatinine, LFTs, hemoglobin, and platelets are within treatment parameters per Dr. Katragadda. Patient is okay to proceed with treatment per M.D.   

## 2021-01-16 NOTE — Patient Instructions (Signed)
[facility]  Discharge Instructions: Thank you for choosing [facility] to provide your oncology and hematology care.  If you have a lab appointment with the Cancer Center, please come in thru the Main Entrance and check in at the main information desk.  Wear comfortable clothing and clothing appropriate for easy access to any Portacath or PICC line.   We strive to give you quality time with your provider. You may need to reschedule your appointment if you arrive late (15 or more minutes).  Arriving late affects you and other patients whose appointments are after yours.  Also, if you miss three or more appointments without notifying the office, you may be dismissed from the clinic at the provider's discretion.      For prescription refill requests, have your pharmacy contact our office and allow 72 hours for refills to be completed.    Today you received the following chemotherapy and/or immunotherapy agents: Cisplatin   To help prevent nausea and vomiting after your treatment, we encourage you to take your nausea medication as directed.  BELOW ARE SYMPTOMS THAT SHOULD BE REPORTED IMMEDIATELY: *FEVER GREATER THAN 100.4 F (38 C) OR HIGHER *CHILLS OR SWEATING *NAUSEA AND VOMITING THAT IS NOT CONTROLLED WITH YOUR NAUSEA MEDICATION *UNUSUAL SHORTNESS OF BREATH *UNUSUAL BRUISING OR BLEEDING *URINARY PROBLEMS (pain or burning when urinating, or frequent urination) *BOWEL PROBLEMS (unusual diarrhea, constipation, pain near the anus) TENDERNESS IN MOUTH AND THROAT WITH OR WITHOUT PRESENCE OF ULCERS (sore throat, sores in mouth, or a toothache) UNUSUAL RASH, SWELLING OR PAIN  UNUSUAL VAGINAL DISCHARGE OR ITCHING   Items with * indicate a potential emergency and should be followed up as soon as possible or go to the Emergency Department if any problems should occur.  Please show the CHEMOTHERAPY ALERT CARD or IMMUNOTHERAPY ALERT CARD at check-in to the Emergency  Department and triage nurse.  Should you have questions after your visit or need to cancel or reschedule your appointment, please contact [facility] [phone]  and follow the prompts.  Office hours are 8:00 a.m. to 4:30 p.m. Monday - Friday. Please note that voicemails left after 4:00 p.m. may not be returned until the following business day.  We are closed weekends and major holidays. You have access to a nurse at all times for urgent questions. Please call the main number to the clinic [phone] and follow the prompts.  For any non-urgent questions, you may also contact your provider using MyChart. We now offer e-Visits for anyone 18 and older to request care online for non-urgent symptoms. For details visit [email or web address].   Also download the MyChart app! Go to the app store, search "MyChart", open the app, select [facility], and log in with your MyChart username and password.  Due to Covid, a mask is required upon entering the hospital/clinic. If you do not have a mask, one will be given to you upon arrival. For doctor visits, patients may have 1 support person aged 18 or older with them. For treatment visits, patients cannot have anyone with them due to current Covid guidelines and our immunocompromised population.  

## 2021-01-17 DIAGNOSIS — C770 Secondary and unspecified malignant neoplasm of lymph nodes of head, face and neck: Secondary | ICD-10-CM | POA: Diagnosis not present

## 2021-01-17 DIAGNOSIS — C098 Malignant neoplasm of overlapping sites of tonsil: Secondary | ICD-10-CM | POA: Diagnosis not present

## 2021-01-17 DIAGNOSIS — Z8249 Family history of ischemic heart disease and other diseases of the circulatory system: Secondary | ICD-10-CM | POA: Diagnosis not present

## 2021-01-17 DIAGNOSIS — I10 Essential (primary) hypertension: Secondary | ICD-10-CM | POA: Diagnosis not present

## 2021-01-17 DIAGNOSIS — E785 Hyperlipidemia, unspecified: Secondary | ICD-10-CM | POA: Diagnosis not present

## 2021-01-17 DIAGNOSIS — Z905 Acquired absence of kidney: Secondary | ICD-10-CM | POA: Diagnosis not present

## 2021-01-17 DIAGNOSIS — Z85528 Personal history of other malignant neoplasm of kidney: Secondary | ICD-10-CM | POA: Diagnosis not present

## 2021-01-17 DIAGNOSIS — Z87891 Personal history of nicotine dependence: Secondary | ICD-10-CM | POA: Diagnosis not present

## 2021-01-17 DIAGNOSIS — C099 Malignant neoplasm of tonsil, unspecified: Secondary | ICD-10-CM | POA: Diagnosis not present

## 2021-01-18 ENCOUNTER — Encounter (HOSPITAL_COMMUNITY): Payer: Medicare HMO | Admitting: Dietician

## 2021-01-18 ENCOUNTER — Telehealth (HOSPITAL_COMMUNITY): Payer: Self-pay | Admitting: Dietician

## 2021-01-18 DIAGNOSIS — Z87891 Personal history of nicotine dependence: Secondary | ICD-10-CM | POA: Diagnosis not present

## 2021-01-18 DIAGNOSIS — Z905 Acquired absence of kidney: Secondary | ICD-10-CM | POA: Diagnosis not present

## 2021-01-18 DIAGNOSIS — E785 Hyperlipidemia, unspecified: Secondary | ICD-10-CM | POA: Diagnosis not present

## 2021-01-18 DIAGNOSIS — C099 Malignant neoplasm of tonsil, unspecified: Secondary | ICD-10-CM | POA: Diagnosis not present

## 2021-01-18 DIAGNOSIS — C770 Secondary and unspecified malignant neoplasm of lymph nodes of head, face and neck: Secondary | ICD-10-CM | POA: Diagnosis not present

## 2021-01-18 DIAGNOSIS — I10 Essential (primary) hypertension: Secondary | ICD-10-CM | POA: Diagnosis not present

## 2021-01-18 DIAGNOSIS — Z85528 Personal history of other malignant neoplasm of kidney: Secondary | ICD-10-CM | POA: Diagnosis not present

## 2021-01-18 DIAGNOSIS — C098 Malignant neoplasm of overlapping sites of tonsil: Secondary | ICD-10-CM | POA: Diagnosis not present

## 2021-01-18 DIAGNOSIS — Z8249 Family history of ischemic heart disease and other diseases of the circulatory system: Secondary | ICD-10-CM | POA: Diagnosis not present

## 2021-01-18 NOTE — Telephone Encounter (Signed)
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   Spoke with patient via telephone. He reports swallowing has become more difficult and appetite is poor. Today he has eaten 1 egg, says he will try to eat a piece of banana bread this afternoon. Patient reports giving 2 cartons Osmolite 1.5 via tube for the past week, he is tolerating this well. Patient reports flushing tube with 60 ml water before and after feedings as well as additional 60 ml flush TID. Reports persistent dry mouth and sore throat, taking sips of water and lemonade throughout the day. He is using baking soda, salt water rinses several times daily. Patient denies nausea, vomiting, diarrhea, constipation.   Medications: Tylenol 3, Magic mouthwash with lidocaine, Hydrocodone  Labs: 9/13 - Glucose 110, BUN 26  Anthropometrics: Weight 144 lb 13.5 oz on 9/13 decreased 1.4% from 146 lb 9.6 oz on 9/6. This is significant for time frame  8/8 - 150 lb  Estimated Energy Needs  Kcals: 2000-2200 Protein: 93-106 Fluid: 2.1 L  NUTRITION DIAGNOSIS: Predicted suboptimal intake has evolved into inadequate oral intake related to tonsil cancer and associated concurrent chemoradiation side effects as evidenced by swallowing difficulties, sips and bites per dietary recall meeting <25% of estimated needs, and concerning 1.4% weight loss in 7 days   INTERVENTION:  Patient will work to increase tube feedings by 1 carton day to goal of 6 cartons Osmolite 1.5/day Encouraged soft, moist high protein foods orally as tolerated Continue baking soda salt water rinses several times daily Patient has contact information  Give 6 cartons Osmolite 1.5 split over 4 feedings/day. Flush tube with 60 ml water before and after each feeding. Drink by mouth or give via tube additional 2.5 cups of fluids daily.  This provides 2130 kcal, 89 grams protein, 2159 ml total water. Meets 100% estimated  kcal, 96% estimated protein needs  MONITORING, EVALUATION, GOAL: Weight trends, tube feeding   NEXT VISIT: via telephone Thursday September 22

## 2021-01-19 DIAGNOSIS — Z87891 Personal history of nicotine dependence: Secondary | ICD-10-CM | POA: Diagnosis not present

## 2021-01-19 DIAGNOSIS — Z905 Acquired absence of kidney: Secondary | ICD-10-CM | POA: Diagnosis not present

## 2021-01-19 DIAGNOSIS — E785 Hyperlipidemia, unspecified: Secondary | ICD-10-CM | POA: Diagnosis not present

## 2021-01-19 DIAGNOSIS — Z85528 Personal history of other malignant neoplasm of kidney: Secondary | ICD-10-CM | POA: Diagnosis not present

## 2021-01-19 DIAGNOSIS — C099 Malignant neoplasm of tonsil, unspecified: Secondary | ICD-10-CM | POA: Diagnosis not present

## 2021-01-19 DIAGNOSIS — C098 Malignant neoplasm of overlapping sites of tonsil: Secondary | ICD-10-CM | POA: Diagnosis not present

## 2021-01-19 DIAGNOSIS — I10 Essential (primary) hypertension: Secondary | ICD-10-CM | POA: Diagnosis not present

## 2021-01-19 DIAGNOSIS — C770 Secondary and unspecified malignant neoplasm of lymph nodes of head, face and neck: Secondary | ICD-10-CM | POA: Diagnosis not present

## 2021-01-19 DIAGNOSIS — Z8249 Family history of ischemic heart disease and other diseases of the circulatory system: Secondary | ICD-10-CM | POA: Diagnosis not present

## 2021-01-22 DIAGNOSIS — Z8249 Family history of ischemic heart disease and other diseases of the circulatory system: Secondary | ICD-10-CM | POA: Diagnosis not present

## 2021-01-22 DIAGNOSIS — Z85528 Personal history of other malignant neoplasm of kidney: Secondary | ICD-10-CM | POA: Diagnosis not present

## 2021-01-22 DIAGNOSIS — I10 Essential (primary) hypertension: Secondary | ICD-10-CM | POA: Diagnosis not present

## 2021-01-22 DIAGNOSIS — C099 Malignant neoplasm of tonsil, unspecified: Secondary | ICD-10-CM | POA: Diagnosis not present

## 2021-01-22 DIAGNOSIS — Z87891 Personal history of nicotine dependence: Secondary | ICD-10-CM | POA: Diagnosis not present

## 2021-01-22 DIAGNOSIS — E785 Hyperlipidemia, unspecified: Secondary | ICD-10-CM | POA: Diagnosis not present

## 2021-01-22 DIAGNOSIS — Z905 Acquired absence of kidney: Secondary | ICD-10-CM | POA: Diagnosis not present

## 2021-01-22 DIAGNOSIS — C098 Malignant neoplasm of overlapping sites of tonsil: Secondary | ICD-10-CM | POA: Diagnosis not present

## 2021-01-22 DIAGNOSIS — C770 Secondary and unspecified malignant neoplasm of lymph nodes of head, face and neck: Secondary | ICD-10-CM | POA: Diagnosis not present

## 2021-01-22 NOTE — Progress Notes (Signed)
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 There is preferential submucosal growth and the mass is indistinguishable from the left prevertebral space and lower left stylopharyngeaus. No convincing adenopathy. 2. Mixed density right upper lobe nodule, attention on anticipated PET.   11/29/2020 Initial Diagnosis   Tonsil cancer ([place])   11/29/2020 Cancer Staging   Staging form: Pharynx - HPV-Mediated Oropharynx, AJCC 8th Edition - Clinical stage from 11/29/2020: cT4, p16+ - Signed by [name], MD on 11/29/2020 Stage prefix: Initial diagnosis   01/09/2021 -  Chemotherapy    Patient is on Treatment Plan: HEAD/NECK CISPLATIN Q7D         CANCER STAGING: Cancer Staging Tonsil cancer [facility]) Staging form: Pharynx - HPV-Mediated Oropharynx, AJCC 8th Edition - Clinical stage from 11/29/2020: cT4, p16+ - Signed by [name], MD on 11/29/2020   INTERVAL HISTORY:  Douglas [name], a 75 y.o. male, returns for routine follow-up and consideration for next cycle of chemotherapy. Douglas [name] was last seen on 01/16/2021.  Due for cycle #3 of Cisplatin today.   Overall, he tells me he has been feeling pretty well. He reports continued trouble swallowing which slightly  worsened yesterday following radiation. He also reports headaches. He is drinking 2 Ensures daily with 2 meals daily. He report slight numbness in his tongue, and he denies tinnitus. He also reports pain upon opening his mouth. He denies nausea and vomiting  Overall, he feels ready for next cycle of chemo today.   REVIEW OF SYSTEMS:  Review of Systems  Constitutional:  Negative for appetite change (60%) and fatigue (60%).  HENT:   Positive for sore throat (5/10) and trouble swallowing. Negative for tinnitus.   Gastrointestinal:  Negative for nausea and vomiting.  Neurological:  Positive for dizziness, headaches and numbness (tongue).  All other systems reviewed and are negative.  PAST MEDICAL/SURGICAL HISTORY:  Past Medical History:  Diagnosis Date   Arthritis    Cancer ([place])    Skin, and Kidney   Hypertension    Pre-diabetes    Skin cancer    Past Surgical History:  Procedure Laterality Date   COLONOSCOPY     HERNIA REPAIR     IR GASTROSTOMY TUBE MOD SED  12/11/2020   IR IMAGING GUIDED PORT INSERTION  12/11/2020   LAPAROSCOPIC PARTIAL NEPHRECTOMY Left    PARTIAL NEPHRECTOMY Left 2009   REVERSE SHOULDER ARTHROPLASTY Right 09/10/2019   Procedure: REVERSE SHOULDER ARTHROPLASTY;  Surgeon: [name], MD;  Location: [facility];  Service: Orthopedics;  Laterality: Right;  interscalene block   SKIN SURGERY      SOCIAL HISTORY:  Social History   Socioeconomic History   Marital status: Widowed    Spouse name: Not on file   Number of children: Not  on file   Years of education: Not on file   Highest education level: Not on file  Occupational History    Employer: [employer]  Tobacco Use   Smoking status: Former    Packs/day: 1.00    Years: 20.00    Pack years: 20.00    Types: Cigarettes    Quit date: 2010    Years since quitting: 12.7   Smokeless tobacco: Never  Vaping Use   Vaping Use: Never used  Substance and Sexual Activity   Alcohol use: Yes    Alcohol/week: 1.0  standard drink    Types: 1 Cans of beer per week    Comment: occ   Drug use: Never   Sexual activity: Not on file  Other Topics Concern   Not on file  Social History Narrative   Not on file   Social Determinants of Health   Financial Resource Strain: Low Risk    Difficulty of Paying Living Expenses: Not hard at all  Food Insecurity: No Food Insecurity   Worried About Charity fundraiser in the Last Year: Never true   [place] in the Last Year: Never true  Transportation Needs: No Transportation Needs   Lack of Transportation (Medical): No   Lack of Transportation (Non-Medical): No  Physical Activity: Sufficiently Active   Days of Exercise per Week: 7 days   Minutes of Exercise per Session: 30 min  Stress: No Stress Concern Present   Feeling of Stress : Not at all  Social Connections: Socially Isolated   Frequency of Communication with Friends and Family: More than three times a week   Frequency of Social Gatherings with Friends and Family: More than three times a week   Attends Religious Services: Never   Marine scientist or Organizations: No   Attends Archivist Meetings: Never   Marital Status: Widowed  Human resources officer Violence: Not At Risk   Fear of Current or Ex-Partner: No   Emotionally Abused: No   Physically Abused: No   Sexually Abused: No    FAMILY HISTORY:  Family History  Problem Relation Age of Onset   Diabetes Mother    Pulmonary embolism Father    Lung cancer Sister    Lung cancer Sister    Diabetes Brother    Lung cancer Brother     CURRENT MEDICATIONS:  Current Outpatient Medications  Medication Sig Dispense Refill   acetaminophen-codeine (TYLENOL #3) 300-30 MG tablet      amLODipine (NORVASC) 10 MG tablet Take 10 mg by mouth daily.     atenolol (TENORMIN) 25 MG tablet Take 25 mg by mouth daily.     cholecalciferol (VITAMIN D3) 25 MCG (1000 UNIT) tablet Take 1,000 Units by mouth daily.     CISPLATIN IV Inject 40 mg/m2  into the vein once a week.     diclofenac (VOLTAREN) 75 MG EC tablet Take 75 mg by mouth 2 (two) times daily.     HYDROcodone-acetaminophen (NORCO) 5-325 MG tablet Take 1 tablet by mouth every 8 (eight) hours as needed for moderate pain. 30 tablet 0   lidocaine-prilocaine (EMLA) cream Apply to affected area once 30 g 3   lisinopril (ZESTRIL) 40 MG tablet Take 40 mg by mouth daily.     magic mouthwash w/lidocaine SOLN Take 5 mLs by mouth 4 (four) times daily as needed for mouth pain. 250 mL 3   Menaquinone-7 (VITAMIN K2 PO) Take 1 tablet by mouth daily.  Multiple Vitamins-Minerals (MULTIVITAMIN WITH MINERALS) tablet Take 1 tablet by mouth daily.     Nutritional Supplements (FEEDING SUPPLEMENT, OSMOLITE 1.5 CAL,) LIQD Give 6 cartons Osmolite 1.5 via tube split over 4 feedings/day. Flush tube with 60 ml water before and after each bolus feeding. Drink by mouth or give via tube additional 2.5 c fluids/day. This provides 2130 kcal, 89 grams protein, 2159 ml total water. Meets 100% needs.  0   ondansetron (ZOFRAN) 8 MG tablet Take 1 tablet (8 mg total) by mouth 2 (two) times daily as needed. Start on the third day after cisplatin chemotherapy. (Patient not taking: Reported on 01/16/2021) 30 tablet 1   prochlorperazine (COMPAZINE) 10 MG tablet Take 1 tablet (10 mg total) by mouth every 6 (six) hours as needed (Nausea or vomiting). (Patient not taking: Reported on 01/16/2021) 30 tablet 1   simvastatin (ZOCOR) 20 MG tablet Take 20 mg by mouth daily.     tamsulosin (FLOMAX) 0.4 MG CAPS capsule Take 0.8 mg by mouth daily.     zinc gluconate 50 MG tablet Take 50 mg by mouth daily.     No current facility-administered medications for this visit.    ALLERGIES:  No Known Allergies  PHYSICAL EXAM:  Performance status (ECOG): 0 - Asymptomatic  There were no vitals filed for this visit. Wt Readings from Last 3 Encounters:  01/16/21 144 lb 13.5 oz (65.7 kg)  01/09/21 146 lb 9.6 oz (66.5 kg)  12/11/20 150  lb (68 kg)   Physical Exam Vitals reviewed.  Constitutional:      Appearance: Normal appearance.  HENT:     Mouth/Throat:     Mouth: Mucous membranes are moist. No oral lesions.  Cardiovascular:     Rate and Rhythm: Normal rate and regular rhythm.     Pulses: Normal pulses.     Heart sounds: Normal heart sounds.  Pulmonary:     Effort: Pulmonary effort is normal.     Breath sounds: Normal breath sounds.  Lymphadenopathy:     Cervical: No cervical adenopathy.     Right cervical: No superficial, deep or posterior cervical adenopathy.    Left cervical: No superficial, deep or posterior cervical adenopathy.  Neurological:     General: No focal deficit present.     Mental Status: He is alert and oriented to person, place, and time.  Psychiatric:        Mood and Affect: Mood normal.        Behavior: Behavior normal.    LABORATORY DATA:  I have reviewed the labs as listed.  CBC Latest Ref Rng & Units 01/16/2021 01/09/2021 12/11/2020  WBC 4.0 - 10.5 K/uL 9.9 8.5 9.1  Hemoglobin 13.0 - 17.0 g/dL 12.7(L) 13.8 14.4  Hematocrit 39.0 - 52.0 % 39.3 41.5 42.9  Platelets 150 - 400 K/uL 191 208 261   CMP Latest Ref Rng & Units 01/16/2021 01/09/2021 11/21/2020  Glucose 70 - 99 mg/dL 110(H) 119(H) -  BUN 8 - 23 mg/dL 26(H) 21 -  Creatinine 0.61 - 1.24 mg/dL 1.16 1.03 1.30(H)  Sodium 135 - 145 mmol/L 140 137 -  Potassium 3.5 - 5.1 mmol/L 4.2 4.2 -  Chloride 98 - 111 mmol/L 101 101 -  CO2 22 - 32 mmol/L 30 28 -  Calcium 8.9 - 10.3 mg/dL 9.2 9.4 -  Total Protein 6.5 - 8.1 g/dL 6.6 7.2 -  Total Bilirubin 0.3 - 1.2 mg/dL 0.6 0.8 -  Alkaline Phos 38 - 126 U/L 91 102 -  AST 15 - 41 U/L 23 28 -  ALT 0 - 44 U/L 26 27 -    DIAGNOSTIC IMAGING:  I have independently reviewed the scans and discussed with the patient. No results found.   ASSESSMENT:  1.  Left tonsillar squamous cell cancer: - Left tonsillar biopsy by Dr. Benjamine Mola on 11/15/2020 consistent with squamous cell carcinoma.  P16 is strongly  positive. - PET scan on 12/07/2020-shows 4.1 cm left palatine tonsil mass with SUV 16.  0.8 cm left level 3 lymph node SUV 4.4.  Elongated 0.9 x 1 cm right lower lobe nodule along the azygoesophageal recess has accentuated metabolic activity with SUV 5.8.  This has some solid component and was not visible on chest CT of 04/24/2020.  Differential includes unusual morphology of metastatic disease versus inflammatory process.  Groundglass opacity laterally in the right upper lobe likely inflammatory with SUV 1.4.  0.6 cm right middle lobe nodule not hypermetabolic. - Concurrent chemoradiation therapy started on 01/09/2021. 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  Hearing on the left side is slightly worse due to tumor.  2.  Social/family history: - He has a retired after working for [employer].  He quit smoking 12 years ago.  He smoked 2 packs/day for 20 years. - Sister had lung cancer.  2 sisters died of lung cancer.  Brother had lung cancer.   PLAN:  1.  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  Reviewed labs from 01/23/2021 which showed normal LFTs.  Creatinine is stable around 1.02.  CBC was grossly normal. - He did not have any nausea or vomiting or ringing in the ears.  Baseline hearing loss is stable.  No tingling or numbness in the extremities. - He will proceed with week 3 cisplatin today.  RTC 1 week for follow-up.  2.  Left ear and headache: - He initially had slight improvement in the left ear pain since treatment started. - However his pain was slightly worse yesterday 2 hours after radiation therapy. - He is taking hydrocodone only at nighttime as needed.  3.  Nutrition: - He is using PEG tube with Osmolite 1 can twice daily. - He is also eating in between tube feeds.  He has gained about 1 pound.   Orders placed this encounter:  No orders of the defined types were  placed in this encounter.    Derek Jermane, MD [place] [phone]   I, Thana Ates, am acting as a scribe for Dr. Derek Douglas.  I, Derek Zymeir MD, have reviewed the above documentation for accuracy and completeness, and I agree with the above.

## 2021-01-23 ENCOUNTER — Inpatient Hospital Stay (HOSPITAL_COMMUNITY): Payer: Medicare HMO

## 2021-01-23 ENCOUNTER — Inpatient Hospital Stay (HOSPITAL_BASED_OUTPATIENT_CLINIC_OR_DEPARTMENT_OTHER): Payer: Medicare HMO | Admitting: Hematology

## 2021-01-23 ENCOUNTER — Other Ambulatory Visit: Payer: Self-pay

## 2021-01-23 VITALS — BP 161/67 | HR 62 | Temp 96.7°F | Resp 18

## 2021-01-23 VITALS — BP 147/69 | HR 63 | Temp 96.9°F | Resp 18 | Wt 145.9 lb

## 2021-01-23 DIAGNOSIS — R519 Headache, unspecified: Secondary | ICD-10-CM | POA: Diagnosis not present

## 2021-01-23 DIAGNOSIS — Z905 Acquired absence of kidney: Secondary | ICD-10-CM | POA: Diagnosis not present

## 2021-01-23 DIAGNOSIS — Z87891 Personal history of nicotine dependence: Secondary | ICD-10-CM | POA: Diagnosis not present

## 2021-01-23 DIAGNOSIS — Z8249 Family history of ischemic heart disease and other diseases of the circulatory system: Secondary | ICD-10-CM | POA: Diagnosis not present

## 2021-01-23 DIAGNOSIS — C099 Malignant neoplasm of tonsil, unspecified: Secondary | ICD-10-CM | POA: Diagnosis not present

## 2021-01-23 DIAGNOSIS — C770 Secondary and unspecified malignant neoplasm of lymph nodes of head, face and neck: Secondary | ICD-10-CM | POA: Diagnosis not present

## 2021-01-23 DIAGNOSIS — Z85828 Personal history of other malignant neoplasm of skin: Secondary | ICD-10-CM | POA: Diagnosis not present

## 2021-01-23 DIAGNOSIS — Z85528 Personal history of other malignant neoplasm of kidney: Secondary | ICD-10-CM

## 2021-01-23 DIAGNOSIS — E785 Hyperlipidemia, unspecified: Secondary | ICD-10-CM | POA: Diagnosis not present

## 2021-01-23 DIAGNOSIS — I10 Essential (primary) hypertension: Secondary | ICD-10-CM | POA: Diagnosis not present

## 2021-01-23 DIAGNOSIS — Z79899 Other long term (current) drug therapy: Secondary | ICD-10-CM | POA: Diagnosis not present

## 2021-01-23 DIAGNOSIS — M542 Cervicalgia: Secondary | ICD-10-CM | POA: Diagnosis not present

## 2021-01-23 DIAGNOSIS — Z5111 Encounter for antineoplastic chemotherapy: Secondary | ICD-10-CM | POA: Diagnosis not present

## 2021-01-23 DIAGNOSIS — C098 Malignant neoplasm of overlapping sites of tonsil: Secondary | ICD-10-CM | POA: Diagnosis not present

## 2021-01-23 DIAGNOSIS — Z801 Family history of malignant neoplasm of trachea, bronchus and lung: Secondary | ICD-10-CM | POA: Diagnosis not present

## 2021-01-23 LAB — COMPREHENSIVE METABOLIC PANEL
ALT: 25 U/L (ref 0–44)
AST: 22 U/L (ref 15–41)
Albumin: 3.6 g/dL (ref 3.5–5.0)
Alkaline Phosphatase: 87 U/L (ref 38–126)
Anion gap: 4 — ABNORMAL LOW (ref 5–15)
BUN: 30 mg/dL — ABNORMAL HIGH (ref 8–23)
CO2: 29 mmol/L (ref 22–32)
Calcium: 8.6 mg/dL — ABNORMAL LOW (ref 8.9–10.3)
Chloride: 104 mmol/L (ref 98–111)
Creatinine, Ser: 1.02 mg/dL (ref 0.61–1.24)
GFR, Estimated: >60 mL/min (ref >60)
Glucose, Bld: 96 mg/dL (ref 70–99)
Potassium: 4.2 mmol/L (ref 3.5–5.1)
Sodium: 137 mmol/L (ref 135–145)
Total Bilirubin: 0.6 mg/dL (ref 0.3–1.2)
Total Protein: 6.8 g/dL (ref 6.5–8.1)

## 2021-01-23 LAB — CBC WITH DIFFERENTIAL/PLATELET
Abs Immature Granulocytes: 0.06 10*3/uL (ref 0.00–0.07)
Basophils Absolute: 0.1 10*3/uL (ref 0.0–0.1)
Basophils Relative: 1 %
Eosinophils Absolute: 0.4 10*3/uL (ref 0.0–0.5)
Eosinophils Relative: 4 %
HCT: 38.5 % — ABNORMAL LOW (ref 39.0–52.0)
Hemoglobin: 12.9 g/dL — ABNORMAL LOW (ref 13.0–17.0)
Immature Granulocytes: 1 %
Lymphocytes Relative: 12 %
Lymphs Abs: 1.2 10*3/uL (ref 0.7–4.0)
MCH: 33.1 pg (ref 26.0–34.0)
MCHC: 33.5 g/dL (ref 30.0–36.0)
MCV: 98.7 fL (ref 80.0–100.0)
Monocytes Absolute: 1.1 10*3/uL — ABNORMAL HIGH (ref 0.1–1.0)
Monocytes Relative: 10 %
Neutro Abs: 7.4 10*3/uL (ref 1.7–7.7)
Neutrophils Relative %: 72 %
Platelets: 211 10*3/uL (ref 150–400)
RBC: 3.9 MIL/uL — ABNORMAL LOW (ref 4.22–5.81)
RDW: 12.8 % (ref 11.5–15.5)
WBC: 10.2 10*3/uL (ref 4.0–10.5)
nRBC: 0 % (ref 0.0–0.2)

## 2021-01-23 LAB — MAGNESIUM: Magnesium: 1.9 mg/dL (ref 1.7–2.4)

## 2021-01-23 MED ORDER — SODIUM CHLORIDE 0.9% FLUSH
10.0000 mL | INTRAVENOUS | Status: DC | PRN
  Administered 2021-01-23: 10 mL

## 2021-01-23 MED ORDER — SODIUM CHLORIDE 0.9 % IV SOLN: Freq: Once | INTRAVENOUS | Status: AC

## 2021-01-23 MED ORDER — HEPARIN SOD (PORK) LOCK FLUSH 100 UNIT/ML IV SOLN
500.0000 [IU] | Freq: Once | INTRAVENOUS | Status: AC | PRN
  Administered 2021-01-23: 500 [IU]

## 2021-01-23 MED ORDER — SODIUM CHLORIDE 0.9 % IV SOLN
10.0000 mg | Freq: Once | INTRAVENOUS | Status: AC
  Administered 2021-01-23: 10 mg via INTRAVENOUS
  Filled 2021-01-23: qty 10

## 2021-01-23 MED ORDER — SODIUM CHLORIDE 0.9 % IV SOLN
150.0000 mg | Freq: Once | INTRAVENOUS | Status: AC
  Administered 2021-01-23: 150 mg via INTRAVENOUS
  Filled 2021-01-23: qty 150

## 2021-01-23 MED ORDER — POTASSIUM CHLORIDE IN NACL 20-0.9 MEQ/L-% IV SOLN
Freq: Once | INTRAVENOUS | Status: AC
  Filled 2021-01-23: qty 1000

## 2021-01-23 MED ORDER — SODIUM CHLORIDE 0.9 % IV SOLN
40.0000 mg/m2 | Freq: Once | INTRAVENOUS | Status: AC
  Administered 2021-01-23: 71 mg via INTRAVENOUS
  Filled 2021-01-23: qty 71

## 2021-01-23 MED ORDER — PALONOSETRON HCL INJECTION 0.25 MG/5ML
0.2500 mg | Freq: Once | INTRAVENOUS | Status: AC
  Administered 2021-01-23: 0.25 mg via INTRAVENOUS
  Filled 2021-01-23: qty 5

## 2021-01-23 MED ORDER — MAGNESIUM SULFATE 2 GM/50ML IV SOLN
2.0000 g | Freq: Once | INTRAVENOUS | Status: AC
Start: 2021-01-23 — End: 2021-01-23
  Administered 2021-01-23: 2 g via INTRAVENOUS
  Filled 2021-01-23: qty 50

## 2021-01-23 NOTE — Patient Instructions (Addendum)
[facility] at [facility] Discharge Instructions  You were seen today by Dr. Katragadda. He went over your recent results, and you received your treatment. Dr. Katragadda will see you back in 1 week for labs and follow up.   Thank you for choosing [facility] at [facility] to provide your oncology and hematology care.  To afford each patient quality time with our provider, please arrive at least 15 minutes before your scheduled appointment time.   If you have a lab appointment with the Cancer Center please come in thru the Main Entrance and check in at the main information desk  You need to re-schedule your appointment should you arrive 10 or more minutes late.  We strive to give you quality time with our providers, and arriving late affects you and other patients whose appointments are after yours.  Also, if you no show three or more times for appointments you may be dismissed from the clinic at the providers discretion.     Again, thank you for choosing [facility].  Our hope is that these requests will decrease the amount of time that you wait before being seen by our physicians.       _____________________________________________________________  Should you have questions after your visit to [facility], please contact our office at [phone] between the hours of 8:00 a.m. and 4:30 p.m.  Voicemails left after 4:00 p.m. will not be returned until the following business day.  For prescription refill requests, have your pharmacy contact our office and allow 72 hours.    Cancer Center Support Programs:   > Cancer Support Group  2nd Tuesday of the month 1pm-2pm, Journey Room    

## 2021-01-23 NOTE — Progress Notes (Signed)
Pt presents today for Cisplatin per provider's order. Vital signs and labs are within parameters for treatment. Okay for treatment today per Dr.K. Pt urinated 250 ml.  Cisplatin  given today per MD orders. Tolerated infusion without adverse affects. Vital signs stable. No complaints at this time. Discharged from clinic ambulatory in stable condition. Alert and oriented x 3. F/U with [facility] as scheduled.

## 2021-01-23 NOTE — Progress Notes (Signed)
Patient has been examined, vital signs and labs have been reviewed by Dr. Katragadda. ANC, Creatinine, LFTs, hemoglobin, and platelets are within treatment parameters per Dr. Katragadda. Patient is okay to proceed with treatment per M.D.   

## 2021-01-23 NOTE — Patient Instructions (Signed)
[facility]  Discharge Instructions: Thank you for choosing [facility] to provide your oncology and hematology care.  If you have a lab appointment with the Cancer Center, please come in thru the Main Entrance and check in at the main information desk.  Wear comfortable clothing and clothing appropriate for easy access to any Portacath or PICC line.   We strive to give you quality time with your provider. You may need to reschedule your appointment if you arrive late (15 or more minutes).  Arriving late affects you and other patients whose appointments are after yours.  Also, if you miss three or more appointments without notifying the office, you may be dismissed from the clinic at the provider's discretion.      For prescription refill requests, have your pharmacy contact our office and allow 72 hours for refills to be completed.    Today you received the following chemotherapy and/or immunotherapy agents: Cisplatin   To help prevent nausea and vomiting after your treatment, we encourage you to take your nausea medication as directed.  BELOW ARE SYMPTOMS THAT SHOULD BE REPORTED IMMEDIATELY: *FEVER GREATER THAN 100.4 F (38 C) OR HIGHER *CHILLS OR SWEATING *NAUSEA AND VOMITING THAT IS NOT CONTROLLED WITH YOUR NAUSEA MEDICATION *UNUSUAL SHORTNESS OF BREATH *UNUSUAL BRUISING OR BLEEDING *URINARY PROBLEMS (pain or burning when urinating, or frequent urination) *BOWEL PROBLEMS (unusual diarrhea, constipation, pain near the anus) TENDERNESS IN MOUTH AND THROAT WITH OR WITHOUT PRESENCE OF ULCERS (sore throat, sores in mouth, or a toothache) UNUSUAL RASH, SWELLING OR PAIN  UNUSUAL VAGINAL DISCHARGE OR ITCHING   Items with * indicate a potential emergency and should be followed up as soon as possible or go to the Emergency Department if any problems should occur.  Please show the CHEMOTHERAPY ALERT CARD or IMMUNOTHERAPY ALERT CARD at check-in to the Emergency  Department and triage nurse.  Should you have questions after your visit or need to cancel or reschedule your appointment, please contact  CANCER CENTER [phone]  and follow the prompts.  Office hours are 8:00 a.m. to 4:30 p.m. Monday - Friday. Please note that voicemails left after 4:00 p.m. may not be returned until the following business day.  We are closed weekends and major holidays. You have access to a nurse at all times for urgent questions. Please call the main number to the clinic [phone] and follow the prompts.  For any non-urgent questions, you may also contact your provider using MyChart. We now offer e-Visits for anyone 18 and older to request care online for non-urgent symptoms. For details visit [email or web address].   Also download the MyChart app! Go to the app store, search "MyChart", open the app, select [facility], and log in with your MyChart username and password.  Due to Covid, a mask is required upon entering the hospital/clinic. If you do not have a mask, one will be given to you upon arrival. For doctor visits, patients may have 1 support person aged 18 or older with them. For treatment visits, patients cannot have anyone with them due to current Covid guidelines and our immunocompromised population.  

## 2021-01-24 DIAGNOSIS — I10 Essential (primary) hypertension: Secondary | ICD-10-CM | POA: Diagnosis not present

## 2021-01-24 DIAGNOSIS — C098 Malignant neoplasm of overlapping sites of tonsil: Secondary | ICD-10-CM | POA: Diagnosis not present

## 2021-01-24 DIAGNOSIS — Z85528 Personal history of other malignant neoplasm of kidney: Secondary | ICD-10-CM | POA: Diagnosis not present

## 2021-01-24 DIAGNOSIS — Z87891 Personal history of nicotine dependence: Secondary | ICD-10-CM | POA: Diagnosis not present

## 2021-01-24 DIAGNOSIS — C770 Secondary and unspecified malignant neoplasm of lymph nodes of head, face and neck: Secondary | ICD-10-CM | POA: Diagnosis not present

## 2021-01-24 DIAGNOSIS — Z905 Acquired absence of kidney: Secondary | ICD-10-CM | POA: Diagnosis not present

## 2021-01-24 DIAGNOSIS — C099 Malignant neoplasm of tonsil, unspecified: Secondary | ICD-10-CM | POA: Diagnosis not present

## 2021-01-24 DIAGNOSIS — E785 Hyperlipidemia, unspecified: Secondary | ICD-10-CM | POA: Diagnosis not present

## 2021-01-24 DIAGNOSIS — Z8249 Family history of ischemic heart disease and other diseases of the circulatory system: Secondary | ICD-10-CM | POA: Diagnosis not present

## 2021-01-25 ENCOUNTER — Encounter (HOSPITAL_COMMUNITY): Payer: Medicare HMO | Admitting: Dietician

## 2021-01-25 ENCOUNTER — Encounter (HOSPITAL_COMMUNITY): Payer: Self-pay | Admitting: Speech Pathology

## 2021-01-25 ENCOUNTER — Telehealth (HOSPITAL_COMMUNITY): Payer: Self-pay | Admitting: Dietician

## 2021-01-25 ENCOUNTER — Other Ambulatory Visit: Payer: Self-pay

## 2021-01-25 ENCOUNTER — Ambulatory Visit (HOSPITAL_COMMUNITY): Payer: Medicare HMO | Attending: Hematology and Oncology | Admitting: Speech Pathology

## 2021-01-25 DIAGNOSIS — Z87891 Personal history of nicotine dependence: Secondary | ICD-10-CM | POA: Diagnosis not present

## 2021-01-25 DIAGNOSIS — C099 Malignant neoplasm of tonsil, unspecified: Secondary | ICD-10-CM | POA: Diagnosis not present

## 2021-01-25 DIAGNOSIS — I10 Essential (primary) hypertension: Secondary | ICD-10-CM | POA: Diagnosis not present

## 2021-01-25 DIAGNOSIS — Z8249 Family history of ischemic heart disease and other diseases of the circulatory system: Secondary | ICD-10-CM | POA: Diagnosis not present

## 2021-01-25 DIAGNOSIS — C098 Malignant neoplasm of overlapping sites of tonsil: Secondary | ICD-10-CM | POA: Diagnosis not present

## 2021-01-25 DIAGNOSIS — R1310 Dysphagia, unspecified: Secondary | ICD-10-CM | POA: Insufficient documentation

## 2021-01-25 DIAGNOSIS — E785 Hyperlipidemia, unspecified: Secondary | ICD-10-CM | POA: Diagnosis not present

## 2021-01-25 DIAGNOSIS — C770 Secondary and unspecified malignant neoplasm of lymph nodes of head, face and neck: Secondary | ICD-10-CM | POA: Diagnosis not present

## 2021-01-25 DIAGNOSIS — Z85528 Personal history of other malignant neoplasm of kidney: Secondary | ICD-10-CM | POA: Diagnosis not present

## 2021-01-25 DIAGNOSIS — Z905 Acquired absence of kidney: Secondary | ICD-10-CM | POA: Diagnosis not present

## 2021-01-25 NOTE — Telephone Encounter (Signed)
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   Spoke with patient via telephone, he reports doing well other than unable to taste foods. Patient reports dry mouth and thickened saliva that is worse in the morning. Patient has increased tube feedings, reports tolerating 3 cartons of Osmolite 1.5. He is flushing tube with 60 ml water before and after each feeding as well as 3 additional times throughout the day. Patient reports he is eating in between tube feedings, made cubans yesterday, this took him a while to eat. He is drinking lots of water and supplements with CIB/whole milk everyday. Patient reports increased fatigue, but he is staying busy. He enjoys cooking and visits his friends at work a few days a week. Patient reports he accidentally pulled his tube out a little the other day, reports small amount of blood around the tube. Patient reports radiation oncologist has looked at his tube.    Medications: reviewed  Labs: 9/20 - BUN 30  Anthropometrics: Weight 145 lb 15.1 oz on 9/20 stable   9/13 - 144 lb 13.5 oz 9/6 - 146 lb 9.6 oz   NUTRITION DIAGNOSIS: Inadequate oral intake ongoing, patient relying on tube feedings   INTERVENTION:  Encouraged eating high calorie, high protein foods as tolerated  Continue CIB with whole milk daily as tolerated Continue Osmolite 1.5 - Give 3 cartons via tube daily with 60 ml water before and after feeding Patient understands he will increase tube feedings if oral intake declines/decrease in weight Discussed strategies for dry mouth, thickened saliva  Continue baking soda, salt water rinses Patient has contact information     MONITORING, EVALUATION, GOAL: weight trends, intake   NEXT VISIT: Thursday September 29 via telephone (pt aware)

## 2021-01-25 NOTE — Therapy (Signed)
Douglas [name], [place], [address] Phone: [phone]   Fax:  [phone]  Speech Language Pathology Evaluation  Patient Details  Name: Douglas [name] MRN: [id number] Date of Birth: [birth year] No data recorded  Encounter Date: 01/25/2021   End of Session - 01/25/21 1603     Visit Number 1    Number of Visits 4    Authorization Type [employer]    SLP Start Time 1130    SLP Stop Time  1230    SLP Time Calculation (min) 60 min    Activity Tolerance Patient tolerated treatment well             Past Medical History:  Diagnosis Date   Arthritis    Cancer ([facility] [place])    Skin, and Kidney   Hypertension    Pre-diabetes    Skin cancer     Past Surgical History:  Procedure Laterality Date   COLONOSCOPY     HERNIA REPAIR     IR GASTROSTOMY TUBE MOD SED  12/11/2020   IR IMAGING GUIDED PORT INSERTION  12/11/2020   LAPAROSCOPIC PARTIAL NEPHRECTOMY Left    PARTIAL NEPHRECTOMY Left 2009   REVERSE SHOULDER ARTHROPLASTY Right 09/10/2019   Procedure: REVERSE SHOULDER ARTHROPLASTY;  Surgeon: [name], MD;  Location: [facility];  Service: Orthopedics;  Laterality: Right;  interscalene block   SKIN SURGERY      There were no vitals filed for this visit.   Subjective Assessment - 01/25/21 1533     Subjective "I have some trouble with solid foods and I can't taste anything."    Currently in Pain? No/denies               Prior Functional Status - 01/25/21 1535       Prior Functional Status   Cognitive/Linguistic Baseline Within functional limits    Type of Home House     Lives With Son    Available Help at Discharge Family    Vocation Retired              General - 01/25/21 1540       General Information   Date of Onset 11/29/20    HPI Douglas [name] is a 75 year old male who was referred by Dr. 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 Pt receiving concurrent chemotherapy and radiation therapy. He had a PEG placed on 12/11/2020. Chemo and radiation should be completed in mid October. He reports some difficulty swallowing solid foods.    Type of Study Bedside Swallow Evaluation    Diet Prior to this Study Regular;Thin liquids    Temperature Spikes Noted No    Respiratory Status Room air    History of Recent Intubation No    Behavior/Cognition Alert;Cooperative;Pleasant mood    Oral Cavity Assessment Within Functional Limits    Oral Care Completed by SLP No    Oral Cavity - Dentition Adequate natural dentition;Missing dentition    Vision Functional for self-feeding    Self-Feeding Abilities Able to feed self    Patient Positioning Upright in chair    Baseline Vocal Quality Normal    Volitional Cough Strong    Volitional Swallow Able to elicit              Oral Motor/Sensory Function - 01/25/21 1547  Oral Motor/Sensory Function   Overall Oral Motor/Sensory Function Mild impairment     Facial ROM Within Functional Limits    Facial Symmetry Within Functional Limits    Facial Strength Within Functional Limits    Facial Sensation Within Functional Limits    Lingual ROM Other (Comment)    Lingual Symmetry Within Functional Limits    Lingual Sensation Within Functional Limits    Velum Within Functional Limits    Mandible Within Functional Limits             Ice Chips - 01/25/21 1548       Ice Chips   Ice chips Not tested             Thin Liquid - 01/25/21 1548       Thin Liquid   Thin Liquid Within functional limits    Presentation Cup;Self Fed                Solid - 01/25/21 1548       Solid   Solid Within functional limits    Presentation Self Fed    Other Comments Pt reports occasional difficulty with solids                 SLP Education - 01/25/21 1534     Education Details Signs and  symptoms of aspiration and and post radiation sequelae    Person(s) Educated Patient    Methods Explanation;Handout    Comprehension Verbalized understanding            Pt currently tolerates soft solids and thin liquids with some reports of need for multiple swallows for dry solids. Oral motor assessment revealed WNL lingual ROM and WNL lingual strength. Labial ROM was WNL and strength was WNL. Velar ROM appeared WNL.   Because data states the risk for dysphagia during and after radiation treatment is high due to undergoing radiation tx, SLP taught pt about the possibility of reduced/limited ability for PO intake during rad tx. SLP encouraged pt to continue swallowing POs as far into rad tx as possible, even ingesting POs and/or completing HEP shortly after administration of pain meds.   SLP educated pt re: changes to swallowing musculature after rad tx, and why adherence to dysphagia HEP provided today and PO consumption was necessary to inhibit muscular disuse atrophy and manage the effects of radiation fibrosis following radiation tx. Further education was provided regarding possible acute and late effects of radiation therapy including: xerostomia, dysgeusia, salivary changes, mucositis/esophagitis, dehydration, weight loss, fatigue, dysphagia, trismus, and lymphedema. It is prudent that patient is followed by a dentist to reduce risk of cavities, infection, osteoradionecrosis, or other oral issues.    Trismus Severity Scale: Interincisal Elicia Lamp et al., 2006)   Severity Level Measurement Number of Fingers  Normal 35-45 mm 3-4  Mild 30-34 mm 2-3  Moderate 15-29 mm 1-2  Severe 5-14 mm 1  Profound 0-4 mm 0   Pt measurement: 31 mm (mild to moderate range)  Pt will demonstrate safe and efficient consumption of self regulated regular textures with thin liquids with use of strategies as needed.  Pt will complete pharyngeal swallowing exercises as assigned with use of written cue after  initial introduction/model from SLP  Pt will verbalize 3 signs/symptoms of aspiration pneumonia with min assist.     Plan - 01/25/21 1606     Clinical Impression Statement Pt presents with min oral phase dysphagia in setting of acute chemoradiation treatment for left tonsillar CA characterized by  some discomfort and globus sensation with solids. 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 He does not have a device to address trismus at this time. SLP will work with Pt to create a splint for him and consider MBSS to get a baseline. Will have Pt come back for treatment and education and request MBSS as needed. Pt is in agreement with plan of care.    Speech Therapy Frequency --   3 visits over the next month   Treatment/Interventions Aspiration precaution training;Patient/family education;SLP instruction and feedback    Potential to Achieve Goals Good    SLP Home Exercise Plan Pt will completed HEP as assigned to facilitate carryover of treatment strategies and techniques in home environment with use of written cues as needed.    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

## 2021-01-26 DIAGNOSIS — C770 Secondary and unspecified malignant neoplasm of lymph nodes of head, face and neck: Secondary | ICD-10-CM | POA: Diagnosis not present

## 2021-01-26 DIAGNOSIS — Z8249 Family history of ischemic heart disease and other diseases of the circulatory system: Secondary | ICD-10-CM | POA: Diagnosis not present

## 2021-01-26 DIAGNOSIS — C098 Malignant neoplasm of overlapping sites of tonsil: Secondary | ICD-10-CM | POA: Diagnosis not present

## 2021-01-26 DIAGNOSIS — I10 Essential (primary) hypertension: Secondary | ICD-10-CM | POA: Diagnosis not present

## 2021-01-26 DIAGNOSIS — Z905 Acquired absence of kidney: Secondary | ICD-10-CM | POA: Diagnosis not present

## 2021-01-26 DIAGNOSIS — E785 Hyperlipidemia, unspecified: Secondary | ICD-10-CM | POA: Diagnosis not present

## 2021-01-26 DIAGNOSIS — C099 Malignant neoplasm of tonsil, unspecified: Secondary | ICD-10-CM | POA: Diagnosis not present

## 2021-01-26 DIAGNOSIS — Z85528 Personal history of other malignant neoplasm of kidney: Secondary | ICD-10-CM | POA: Diagnosis not present

## 2021-01-26 DIAGNOSIS — Z87891 Personal history of nicotine dependence: Secondary | ICD-10-CM | POA: Diagnosis not present

## 2021-01-29 DIAGNOSIS — Z85528 Personal history of other malignant neoplasm of kidney: Secondary | ICD-10-CM | POA: Diagnosis not present

## 2021-01-29 DIAGNOSIS — E559 Vitamin D deficiency, unspecified: Secondary | ICD-10-CM | POA: Diagnosis not present

## 2021-01-29 DIAGNOSIS — E785 Hyperlipidemia, unspecified: Secondary | ICD-10-CM | POA: Diagnosis not present

## 2021-01-29 DIAGNOSIS — Z79899 Other long term (current) drug therapy: Secondary | ICD-10-CM | POA: Diagnosis not present

## 2021-01-29 DIAGNOSIS — C770 Secondary and unspecified malignant neoplasm of lymph nodes of head, face and neck: Secondary | ICD-10-CM | POA: Diagnosis not present

## 2021-01-29 DIAGNOSIS — C099 Malignant neoplasm of tonsil, unspecified: Secondary | ICD-10-CM | POA: Diagnosis not present

## 2021-01-29 DIAGNOSIS — Z905 Acquired absence of kidney: Secondary | ICD-10-CM | POA: Diagnosis not present

## 2021-01-29 DIAGNOSIS — I10 Essential (primary) hypertension: Secondary | ICD-10-CM | POA: Diagnosis not present

## 2021-01-29 DIAGNOSIS — Z8249 Family history of ischemic heart disease and other diseases of the circulatory system: Secondary | ICD-10-CM | POA: Diagnosis not present

## 2021-01-29 DIAGNOSIS — Z7689 Persons encountering health services in other specified circumstances: Secondary | ICD-10-CM | POA: Diagnosis not present

## 2021-01-29 DIAGNOSIS — Z0000 Encounter for general adult medical examination without abnormal findings: Secondary | ICD-10-CM | POA: Diagnosis not present

## 2021-01-29 DIAGNOSIS — C098 Malignant neoplasm of overlapping sites of tonsil: Secondary | ICD-10-CM | POA: Diagnosis not present

## 2021-01-29 DIAGNOSIS — Z87891 Personal history of nicotine dependence: Secondary | ICD-10-CM | POA: Diagnosis not present

## 2021-01-29 DIAGNOSIS — Z125 Encounter for screening for malignant neoplasm of prostate: Secondary | ICD-10-CM | POA: Diagnosis not present

## 2021-01-29 NOTE — Progress Notes (Signed)
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 There is preferential submucosal growth and the mass is indistinguishable from the left prevertebral space and lower left stylopharyngeaus. No convincing adenopathy. 2. Mixed density right upper lobe nodule, attention on anticipated PET.   11/29/2020 Initial Diagnosis   Tonsil cancer ([facility])   11/29/2020 Cancer Staging   Staging form: Pharynx - HPV-Mediated Oropharynx, AJCC 8th Edition - Clinical stage from 11/29/2020: cT4, p16+ - Signed by Douglas Lark, MD on 11/29/2020 Stage prefix: Initial diagnosis   01/09/2021 -  Chemotherapy    Patient is on Treatment Plan: HEAD/NECK CISPLATIN Q7D         CANCER STAGING: Cancer Staging Tonsil cancer [facility]) Staging form: Pharynx - HPV-Mediated Oropharynx, AJCC 8th Edition - Clinical stage from 11/29/2020: cT4, p16+ - Signed by Douglas Lark, MD on 11/29/2020   INTERVAL HISTORY:  Douglas [name], a 75 y.o. male, returns for routine follow-up and consideration for next cycle of chemotherapy. Douglas [name] was last seen on 01/23/2021.  Due for cycle #4 of Cisplatin today.   Overall, he tells me he has been feeling pretty well. His appetite is decreased, he has continued loss of taste,  and he has lost 3 pounds in 1 week. He has 3 Osmolite per day, and he reports nausea in the mornings which is helped with Compazine. He denies vomiting, tingling/numbness, headaches,  and tinnitus. He reports increased loss of hearing since starting radiation.   Overall, he feels ready for next cycle of chemo today.   REVIEW OF SYSTEMS:  Review of Systems  Constitutional:  Positive for appetite change (40%). Negative for fatigue (50%).  HENT:   Positive for hearing loss and trouble swallowing (chewing). Negative for tinnitus.   Respiratory:  Positive for cough (occasional).   Gastrointestinal:  Positive for nausea (mornings). Negative for vomiting.  Neurological:  Negative for headaches and numbness.  Psychiatric/Behavioral:  Positive for sleep disturbance.   All other systems reviewed and are negative.  PAST MEDICAL/SURGICAL HISTORY:  Past Medical History:  Diagnosis Date   Arthritis    Cancer ([place] [facility])    Skin, and Kidney   Hypertension    Pre-diabetes    Skin cancer    Past Surgical History:  Procedure Laterality Date   COLONOSCOPY     HERNIA REPAIR     IR GASTROSTOMY TUBE MOD SED  12/11/2020   IR IMAGING GUIDED PORT INSERTION  12/11/2020   LAPAROSCOPIC PARTIAL NEPHRECTOMY Left    PARTIAL NEPHRECTOMY Left 2009   REVERSE SHOULDER ARTHROPLASTY Right 09/10/2019   Procedure: REVERSE SHOULDER ARTHROPLASTY;  Surgeon: [name], MD;  Location: [facility];  Service: Orthopedics;  Laterality: Right;  interscalene block   SKIN SURGERY      SOCIAL HISTORY:  Social History  Socioeconomic History   Marital status: Widowed    Spouse name: Not on file   Number of children: Not on file   Years of education: Not on file   Highest education level: Not on file  Occupational History    Employer: [employer]  Tobacco Use   Smoking status: Former    Packs/day: 1.00    Years: 20.00    Pack years: 20.00    Types: Cigarettes    Quit date: 2010    Years since quitting: 12.7   Smokeless  tobacco: Never  Vaping Use   Vaping Use: Never used  Substance and Sexual Activity   Alcohol use: Yes    Alcohol/week: 1.0 standard drink    Types: 1 Cans of beer per week    Comment: occ   Drug use: Never   Sexual activity: Not on file  Other Topics Concern   Not on file  Social History Narrative   Not on file   Social Determinants of Health   Financial Resource Strain: Low Risk    Difficulty of Paying Living Expenses: Not hard at all  Food Insecurity: No Food Insecurity   Worried About Charity fundraiser in the Last Year: Never true   [place] in the Last Year: Never true  Transportation Needs: No Transportation Needs   Lack of Transportation (Medical): No   Lack of Transportation (Non-Medical): No  Physical Activity: Sufficiently Active   Days of Exercise per Week: 7 days   Minutes of Exercise per Session: 30 min  Stress: No Stress Concern Present   Feeling of Stress : Not at all  Social Connections: Socially Isolated   Frequency of Communication with Friends and Family: More than three times a week   Frequency of Social Gatherings with Friends and Family: More than three times a week   Attends Religious Services: Never   Marine scientist or Organizations: No   Attends Archivist Meetings: Never   Marital Status: Widowed  Human resources officer Violence: Not At Risk   Fear of Current or Ex-Partner: No   Emotionally Abused: No   Physically Abused: No   Sexually Abused: No    FAMILY HISTORY:  Family History  Problem Relation Age of Onset   Diabetes Mother    Pulmonary embolism Father    Lung cancer Sister    Lung cancer Sister    Diabetes Brother    Lung cancer Brother     CURRENT MEDICATIONS:  Current Outpatient Medications  Medication Sig Dispense Refill   acetaminophen-codeine (TYLENOL #3) 300-30 MG tablet      amLODipine (NORVASC) 10 MG tablet Take 10 mg by mouth daily.     atenolol (TENORMIN) 25 MG tablet Take 25 mg by mouth daily.      cholecalciferol (VITAMIN D3) 25 MCG (1000 UNIT) tablet Take 1,000 Units by mouth daily.     CISPLATIN IV Inject 40 mg/m2 into the vein once a week.     diclofenac (VOLTAREN) 75 MG EC tablet Take 75 mg by mouth 2 (two) times daily.     HYDROcodone-acetaminophen (NORCO) 5-325 MG tablet Take 1 tablet by mouth every 8 (eight) hours as needed for moderate pain. (Patient not taking: Reported on 01/23/2021) 30 tablet 0   lidocaine-prilocaine (EMLA) cream Apply to affected area once 30 g 3   lisinopril (ZESTRIL) 40 MG tablet Take 40 mg by mouth daily.     magic mouthwash w/lidocaine SOLN Take 5 mLs by  mouth 4 (four) times daily as needed for mouth pain. (Patient not taking: Reported on 01/23/2021) 250 mL 3   Menaquinone-7 (VITAMIN K2 PO) Take 1 tablet by mouth daily.     Multiple Vitamins-Minerals (MULTIVITAMIN WITH MINERALS) tablet Take 1 tablet by mouth daily.     Nutritional Supplements (FEEDING SUPPLEMENT, OSMOLITE 1.5 CAL,) LIQD Give 6 cartons Osmolite 1.5 via tube split over 4 feedings/day. Flush tube with 60 ml water before and after each bolus feeding. Drink by mouth or give via tube additional 2.5 c fluids/day. This provides 2130 kcal, 89 grams protein, 2159 ml total water. Meets 100% needs.  0   ondansetron (ZOFRAN) 8 MG tablet Take 1 tablet (8 mg total) by mouth 2 (two) times daily as needed. Start on the third day after cisplatin chemotherapy. (Patient not taking: Reported on 01/23/2021) 30 tablet 1   prochlorperazine (COMPAZINE) 10 MG tablet Take 1 tablet (10 mg total) by mouth every 6 (six) hours as needed (Nausea or vomiting). (Patient not taking: Reported on 01/23/2021) 30 tablet 1   simvastatin (ZOCOR) 20 MG tablet Take 20 mg by mouth daily.     tamsulosin (FLOMAX) 0.4 MG CAPS capsule Take 0.8 mg by mouth daily.     zinc gluconate 50 MG tablet Take 50 mg by mouth daily.     No current facility-administered medications for this visit.    ALLERGIES:  No Known Allergies  PHYSICAL EXAM:   Performance status (ECOG): 0 - Asymptomatic  There were no vitals filed for this visit. Wt Readings from Last 3 Encounters:  01/23/21 145 lb 15.1 oz (66.2 kg)  01/16/21 144 lb 13.5 oz (65.7 kg)  01/09/21 146 lb 9.6 oz (66.5 kg)   Physical Exam Vitals reviewed.  Constitutional:      Appearance: Normal appearance.  HENT:     Mouth/Throat:     Mouth: Mucous membranes are moist. No oral lesions.  Cardiovascular:     Rate and Rhythm: Normal rate and regular rhythm.     Pulses: Normal pulses.     Heart sounds: Normal heart sounds.  Pulmonary:     Effort: Pulmonary effort is normal.     Breath sounds: Normal breath sounds.  Lymphadenopathy:     Cervical: No cervical adenopathy.     Right cervical: No superficial cervical adenopathy.    Left cervical: No superficial cervical adenopathy.  Neurological:     General: No focal deficit present.     Mental Status: He is alert and oriented to person, place, and time.  Psychiatric:        Mood and Affect: Mood normal.        Behavior: Behavior normal.    LABORATORY DATA:  I have reviewed the labs as listed.  CBC Latest Ref Rng & Units 01/23/2021 01/16/2021 01/09/2021  WBC 4.0 - 10.5 K/uL 10.2 9.9 8.5  Hemoglobin 13.0 - 17.0 g/dL 12.9(L) 12.7(L) 13.8  Hematocrit 39.0 - 52.0 % 38.5(L) 39.3 41.5  Platelets 150 - 400 K/uL 211 191 208   CMP Latest Ref Rng & Units 01/23/2021 01/16/2021 01/09/2021  Glucose 70 - 99 mg/dL 96 110(H) 119(H)  BUN 8 - 23 mg/dL 30(H) 26(H) 21  Creatinine 0.61 - 1.24 mg/dL 1.02 1.16 1.03  Sodium 135 - 145 mmol/L 137 140 137  Potassium 3.5 - 5.1 mmol/L 4.2 4.2 4.2  Chloride 98 - 111 mmol/L 104 101 101  CO2 22 - 32 mmol/L 29 30 28   Calcium 8.9 - 10.3 mg/dL 8.6(L) 9.2 9.4  Total Protein 6.5 - 8.1 g/dL 6.8 6.6 7.2  Total Bilirubin 0.3 - 1.2 mg/dL 0.6 0.6 0.8  Alkaline Phos 38 - 126 U/L 87 91 102  AST 15 - 41 U/L 22 23 28   ALT 0 - 44 U/L 25 26 27     DIAGNOSTIC IMAGING:  I have independently reviewed the scans and  discussed with the patient. No results found.   ASSESSMENT:  1.  Left tonsillar squamous cell cancer: - Left tonsillar biopsy by Dr. Benjamine Mola on 11/15/2020 consistent with squamous cell carcinoma.  P16 is strongly positive. - PET scan on 12/07/2020-shows 4.1 cm left palatine tonsil mass with SUV 16.  0.8 cm left level 3 lymph node SUV 4.4.  Elongated 0.9 x 1 cm right lower lobe nodule along the azygoesophageal recess has accentuated metabolic activity with SUV 5.8.  This has some solid component and was not visible on chest CT of 04/24/2020.  Differential includes unusual morphology of metastatic disease versus inflammatory process.  Groundglass opacity laterally in the right upper lobe likely inflammatory with SUV 1.4.  0.6 cm right middle lobe nodule not hypermetabolic. - Concurrent chemoradiation therapy started on 01/09/2021. 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  Hearing on the left side is slightly worse due to tumor.  2.  Social/family history: - He has a retired after working for [employer].  He quit smoking 12 years ago.  He smoked 2 packs/day for 20 years. - Sister had lung cancer.  2 sisters died of lung cancer.  Brother had lung cancer.   PLAN:  1.  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 - He reports decreased taste and decreased appetite. - He has occasional nausea which is controlled with Compazine.  Denied any vomiting.  Denies any tingling or numbness in the extremities.  No ringing in the ears.  Reports improvement in the pain in the left ear region. - Reviewed labs which showed normal renal function.  LFTs are normal.  CBC was grossly normal. - Proceed with cisplatin today with pre and post hydration.  RTC 1 week for follow-up.  2.  Left ear and headache: - This has improved since last week.  He is no longer requiring hydrocodone.  3.  Nutrition: - He lost 3 pounds. -  He is taking an Osmolite 3 cans/day and eating 1 meal per day. - Recommend increasing Osmolite to 4 cans/day.   Orders placed this encounter:  No orders of the defined types were placed in this encounter.    Derek Maxime, MD [place] [phone]   I, Thana Ates, am acting as a scribe for Dr. Derek Copper.  I, Derek Nahzir MD, have reviewed the above documentation for accuracy and completeness, and I agree with the above.

## 2021-01-30 ENCOUNTER — Inpatient Hospital Stay (HOSPITAL_COMMUNITY): Payer: Medicare HMO

## 2021-01-30 ENCOUNTER — Inpatient Hospital Stay (HOSPITAL_COMMUNITY): Payer: Medicare HMO | Admitting: Hematology

## 2021-01-30 ENCOUNTER — Other Ambulatory Visit: Payer: Self-pay

## 2021-01-30 VITALS — BP 121/52 | HR 63 | Temp 96.4°F | Resp 18 | Wt 142.8 lb

## 2021-01-30 VITALS — BP 152/60 | HR 62 | Temp 97.6°F | Resp 16

## 2021-01-30 DIAGNOSIS — R519 Headache, unspecified: Secondary | ICD-10-CM | POA: Diagnosis not present

## 2021-01-30 DIAGNOSIS — C099 Malignant neoplasm of tonsil, unspecified: Secondary | ICD-10-CM | POA: Diagnosis not present

## 2021-01-30 DIAGNOSIS — Z5111 Encounter for antineoplastic chemotherapy: Secondary | ICD-10-CM | POA: Diagnosis not present

## 2021-01-30 DIAGNOSIS — Z87891 Personal history of nicotine dependence: Secondary | ICD-10-CM | POA: Diagnosis not present

## 2021-01-30 DIAGNOSIS — C770 Secondary and unspecified malignant neoplasm of lymph nodes of head, face and neck: Secondary | ICD-10-CM | POA: Diagnosis not present

## 2021-01-30 DIAGNOSIS — M542 Cervicalgia: Secondary | ICD-10-CM | POA: Diagnosis not present

## 2021-01-30 DIAGNOSIS — Z85528 Personal history of other malignant neoplasm of kidney: Secondary | ICD-10-CM

## 2021-01-30 DIAGNOSIS — E785 Hyperlipidemia, unspecified: Secondary | ICD-10-CM | POA: Diagnosis not present

## 2021-01-30 DIAGNOSIS — I10 Essential (primary) hypertension: Secondary | ICD-10-CM | POA: Diagnosis not present

## 2021-01-30 DIAGNOSIS — Z79899 Other long term (current) drug therapy: Secondary | ICD-10-CM | POA: Diagnosis not present

## 2021-01-30 DIAGNOSIS — Z8249 Family history of ischemic heart disease and other diseases of the circulatory system: Secondary | ICD-10-CM | POA: Diagnosis not present

## 2021-01-30 DIAGNOSIS — Z85828 Personal history of other malignant neoplasm of skin: Secondary | ICD-10-CM | POA: Diagnosis not present

## 2021-01-30 DIAGNOSIS — C098 Malignant neoplasm of overlapping sites of tonsil: Secondary | ICD-10-CM | POA: Diagnosis not present

## 2021-01-30 DIAGNOSIS — Z801 Family history of malignant neoplasm of trachea, bronchus and lung: Secondary | ICD-10-CM | POA: Diagnosis not present

## 2021-01-30 DIAGNOSIS — Z905 Acquired absence of kidney: Secondary | ICD-10-CM | POA: Diagnosis not present

## 2021-01-30 LAB — COMPREHENSIVE METABOLIC PANEL
ALT: 22 U/L (ref 0–44)
AST: 21 U/L (ref 15–41)
Albumin: 3.6 g/dL (ref 3.5–5.0)
Alkaline Phosphatase: 82 U/L (ref 38–126)
Anion gap: 6 (ref 5–15)
BUN: 29 mg/dL — ABNORMAL HIGH (ref 8–23)
CO2: 28 mmol/L (ref 22–32)
Calcium: 8.9 mg/dL (ref 8.9–10.3)
Chloride: 101 mmol/L (ref 98–111)
Creatinine, Ser: 0.98 mg/dL (ref 0.61–1.24)
GFR, Estimated: >60 mL/min (ref >60)
Glucose, Bld: 120 mg/dL — ABNORMAL HIGH (ref 70–99)
Potassium: 4 mmol/L (ref 3.5–5.1)
Sodium: 135 mmol/L (ref 135–145)
Total Bilirubin: 0.6 mg/dL (ref 0.3–1.2)
Total Protein: 6.6 g/dL (ref 6.5–8.1)

## 2021-01-30 LAB — CBC WITH DIFFERENTIAL/PLATELET
Abs Immature Granulocytes: 0.03 10*3/uL (ref 0.00–0.07)
Basophils Absolute: 0.1 10*3/uL (ref 0.0–0.1)
Basophils Relative: 1 %
Eosinophils Absolute: 0.3 10*3/uL (ref 0.0–0.5)
Eosinophils Relative: 4 %
HCT: 37.1 % — ABNORMAL LOW (ref 39.0–52.0)
Hemoglobin: 12.2 g/dL — ABNORMAL LOW (ref 13.0–17.0)
Immature Granulocytes: 0 %
Lymphocytes Relative: 12 %
Lymphs Abs: 0.9 10*3/uL (ref 0.7–4.0)
MCH: 32.1 pg (ref 26.0–34.0)
MCHC: 32.9 g/dL (ref 30.0–36.0)
MCV: 97.6 fL (ref 80.0–100.0)
Monocytes Absolute: 0.8 10*3/uL (ref 0.1–1.0)
Monocytes Relative: 12 %
Neutro Abs: 4.9 10*3/uL (ref 1.7–7.7)
Neutrophils Relative %: 71 %
Platelets: 202 10*3/uL (ref 150–400)
RBC: 3.8 MIL/uL — ABNORMAL LOW (ref 4.22–5.81)
RDW: 12.8 % (ref 11.5–15.5)
WBC: 7 10*3/uL (ref 4.0–10.5)
nRBC: 0 % (ref 0.0–0.2)

## 2021-01-30 LAB — MAGNESIUM: Magnesium: 2 mg/dL (ref 1.7–2.4)

## 2021-01-30 MED ORDER — SODIUM CHLORIDE 0.9 % IV SOLN
10.0000 mg | Freq: Once | INTRAVENOUS | Status: AC
  Administered 2021-01-30: 10 mg via INTRAVENOUS
  Filled 2021-01-30: qty 10

## 2021-01-30 MED ORDER — POTASSIUM CHLORIDE IN NACL 20-0.9 MEQ/L-% IV SOLN
Freq: Once | INTRAVENOUS | Status: AC
  Filled 2021-01-30: qty 1000

## 2021-01-30 MED ORDER — HEPARIN SOD (PORK) LOCK FLUSH 100 UNIT/ML IV SOLN
500.0000 [IU] | Freq: Once | INTRAVENOUS | Status: AC | PRN
  Administered 2021-01-30: 500 [IU]

## 2021-01-30 MED ORDER — SODIUM CHLORIDE 0.9 % IV SOLN
150.0000 mg | Freq: Once | INTRAVENOUS | Status: AC
  Administered 2021-01-30: 150 mg via INTRAVENOUS
  Filled 2021-01-30: qty 150

## 2021-01-30 MED ORDER — SODIUM CHLORIDE 0.9 % IV SOLN: Freq: Once | INTRAVENOUS | Status: AC

## 2021-01-30 MED ORDER — CISPLATIN CHEMO INJECTION 100MG/100ML
40.0000 mg/m2 | Freq: Once | INTRAVENOUS | Status: AC
  Administered 2021-01-30: 71 mg via INTRAVENOUS
  Filled 2021-01-30: qty 71

## 2021-01-30 MED ORDER — MAGNESIUM SULFATE 2 GM/50ML IV SOLN
2.0000 g | Freq: Once | INTRAVENOUS | Status: AC
  Administered 2021-01-30: 2 g via INTRAVENOUS
  Filled 2021-01-30: qty 50

## 2021-01-30 MED ORDER — SODIUM CHLORIDE 0.9% FLUSH
10.0000 mL | INTRAVENOUS | Status: DC | PRN
  Administered 2021-01-30 (×2): 10 mL

## 2021-01-30 MED ORDER — PALONOSETRON HCL INJECTION 0.25 MG/5ML
0.2500 mg | Freq: Once | INTRAVENOUS | Status: AC
  Administered 2021-01-30: 0.25 mg via INTRAVENOUS
  Filled 2021-01-30: qty 5

## 2021-01-30 NOTE — Progress Notes (Signed)
Patient has been examined, vital signs and labs have been reviewed by Dr. Katragadda. ANC, Creatinine, LFTs, hemoglobin, and platelets are within treatment parameters per Dr. Katragadda. Patient is okay to proceed with treatment per M.D.   

## 2021-01-30 NOTE — Progress Notes (Signed)
Patient tolerated chemotherapy with no complaints voiced. Side effects with management reviewed understanding verbalized. Port site clean and dry with no bruising or swelling noted at site. Good blood return noted before and after administration of chemotherapy. Band aid applied. Patient left in satisfactory condition with VSS and no s/s of distress noted. 

## 2021-01-30 NOTE — Patient Instructions (Signed)
[facility]  Discharge Instructions: Thank you for choosing [facility] to provide your oncology and hematology care.  If you have a lab appointment with the [facility], please come in thru the Main Entrance and check in at the main information desk.  Wear comfortable clothing and clothing appropriate for easy access to any Portacath or PICC line.   We strive to give you quality time with your provider. You may need to reschedule your appointment if you arrive late (15 or more minutes).  Arriving late affects you and other patients whose appointments are after yours.  Also, if you miss three or more appointments without notifying the office, you may be dismissed from the clinic at the provider's discretion.      For prescription refill requests, have your pharmacy contact our office and allow 72 hours for refills to be completed.    Today you received the following chemotherapy and/or immunotherapy agents Cisplatin. Return as scheduled.   To help prevent nausea and vomiting after your treatment, we encourage you to take your nausea medication as directed.  BELOW ARE SYMPTOMS THAT SHOULD BE REPORTED IMMEDIATELY: *FEVER GREATER THAN 100.4 F (38 C) OR HIGHER *CHILLS OR SWEATING *NAUSEA AND VOMITING THAT IS NOT CONTROLLED WITH YOUR NAUSEA MEDICATION *UNUSUAL SHORTNESS OF BREATH *UNUSUAL BRUISING OR BLEEDING *URINARY PROBLEMS (pain or burning when urinating, or frequent urination) *BOWEL PROBLEMS (unusual diarrhea, constipation, pain near the anus) TENDERNESS IN MOUTH AND THROAT WITH OR WITHOUT PRESENCE OF ULCERS (sore throat, sores in mouth, or a toothache) UNUSUAL RASH, SWELLING OR PAIN  UNUSUAL VAGINAL DISCHARGE OR ITCHING   Items with * indicate a potential emergency and should be followed up as soon as possible or go to the Emergency Department if any problems should occur.  Please show the CHEMOTHERAPY ALERT CARD or IMMUNOTHERAPY ALERT CARD at check-in to  the Emergency Department and triage nurse.  Should you have questions after your visit or need to cancel or reschedule your appointment, please contact [facility] [phone]  and follow the prompts.  Office hours are 8:00 a.m. to 4:30 p.m. Monday - Friday. Please note that voicemails left after 4:00 p.m. may not be returned until the following business day.  We are closed weekends and major holidays. You have access to a nurse at all times for urgent questions. Please call the main number to the clinic [phone] and follow the prompts.  For any non-urgent questions, you may also contact your provider using MyChart. We now offer e-Visits for anyone 73 and older to request care online for non-urgent symptoms. For details visit [email or web address].   Also download the MyChart app! Go to the app store, search "MyChart", open the app, select [facility], and log in with your MyChart username and password.  Due to Covid, a mask is required upon entering the hospital/clinic. If you do not have a mask, one will be given to you upon arrival. For doctor visits, patients may have 1 support person aged 73 or older with them. For treatment visits, patients cannot have anyone with them due to current Covid guidelines and our immunocompromised population.

## 2021-01-30 NOTE — Patient Instructions (Addendum)
[facility] at [facility] Discharge Instructions  You were seen today by Dr. Katragadda. He went over your recent results, and you received your treatment. Dr. Katragadda will see you back in 1 week for labs and follow up.   Thank you for choosing [facility] at [facility] to provide your oncology and hematology care.  To afford each patient quality time with our provider, please arrive at least 15 minutes before your scheduled appointment time.   If you have a lab appointment with the Cancer Center please come in thru the Main Entrance and check in at the main information desk  You need to re-schedule your appointment should you arrive 10 or more minutes late.  We strive to give you quality time with our providers, and arriving late affects you and other patients whose appointments are after yours.  Also, if you no show three or more times for appointments you may be dismissed from the clinic at the providers discretion.     Again, thank you for choosing [facility].  Our hope is that these requests will decrease the amount of time that you wait before being seen by our physicians.       _____________________________________________________________  Should you have questions after your [facility], please contact our office at [phone] between the hours of 8:00 a.m. and 4:30 p.m.  Voicemails left after 4:00 p.m. will not be returned until the following business day.  For prescription refill requests, have your pharmacy contact our office and allow 72 hours.    Cancer Center Support Programs:   > Cancer Support Group  2nd Tuesday of the month 1pm-2pm, Journey Room    

## 2021-01-30 NOTE — Progress Notes (Signed)
Patients port flushed without difficulty.  Good blood return noted with no bruising or swelling noted at site.  Stable during access and blood draw.  Patient to remain accessed for treatment. 

## 2021-01-31 DIAGNOSIS — Z905 Acquired absence of kidney: Secondary | ICD-10-CM | POA: Diagnosis not present

## 2021-01-31 DIAGNOSIS — E785 Hyperlipidemia, unspecified: Secondary | ICD-10-CM | POA: Diagnosis not present

## 2021-01-31 DIAGNOSIS — Z87891 Personal history of nicotine dependence: Secondary | ICD-10-CM | POA: Diagnosis not present

## 2021-01-31 DIAGNOSIS — Z85528 Personal history of other malignant neoplasm of kidney: Secondary | ICD-10-CM | POA: Diagnosis not present

## 2021-01-31 DIAGNOSIS — C770 Secondary and unspecified malignant neoplasm of lymph nodes of head, face and neck: Secondary | ICD-10-CM | POA: Diagnosis not present

## 2021-01-31 DIAGNOSIS — I10 Essential (primary) hypertension: Secondary | ICD-10-CM | POA: Diagnosis not present

## 2021-01-31 DIAGNOSIS — Z8249 Family history of ischemic heart disease and other diseases of the circulatory system: Secondary | ICD-10-CM | POA: Diagnosis not present

## 2021-01-31 DIAGNOSIS — C099 Malignant neoplasm of tonsil, unspecified: Secondary | ICD-10-CM | POA: Diagnosis not present

## 2021-01-31 DIAGNOSIS — C098 Malignant neoplasm of overlapping sites of tonsil: Secondary | ICD-10-CM | POA: Diagnosis not present

## 2021-02-01 ENCOUNTER — Inpatient Hospital Stay: Payer: Medicare HMO | Attending: Genetic Counselor | Admitting: Dietician

## 2021-02-01 ENCOUNTER — Telehealth: Payer: Self-pay | Admitting: Dietician

## 2021-02-01 DIAGNOSIS — Z85528 Personal history of other malignant neoplasm of kidney: Secondary | ICD-10-CM | POA: Diagnosis not present

## 2021-02-01 DIAGNOSIS — E785 Hyperlipidemia, unspecified: Secondary | ICD-10-CM | POA: Diagnosis not present

## 2021-02-01 DIAGNOSIS — Z905 Acquired absence of kidney: Secondary | ICD-10-CM | POA: Diagnosis not present

## 2021-02-01 DIAGNOSIS — I10 Essential (primary) hypertension: Secondary | ICD-10-CM | POA: Diagnosis not present

## 2021-02-01 DIAGNOSIS — C099 Malignant neoplasm of tonsil, unspecified: Secondary | ICD-10-CM | POA: Diagnosis not present

## 2021-02-01 DIAGNOSIS — Z8249 Family history of ischemic heart disease and other diseases of the circulatory system: Secondary | ICD-10-CM | POA: Diagnosis not present

## 2021-02-01 DIAGNOSIS — C098 Malignant neoplasm of overlapping sites of tonsil: Secondary | ICD-10-CM | POA: Diagnosis not present

## 2021-02-01 DIAGNOSIS — Z23 Encounter for immunization: Secondary | ICD-10-CM | POA: Diagnosis not present

## 2021-02-01 DIAGNOSIS — C770 Secondary and unspecified malignant neoplasm of lymph nodes of head, face and neck: Secondary | ICD-10-CM | POA: Diagnosis not present

## 2021-02-01 DIAGNOSIS — Z87891 Personal history of nicotine dependence: Secondary | ICD-10-CM | POA: Diagnosis not present

## 2021-02-01 NOTE — Telephone Encounter (Signed)
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   Spoke with patient via telephone. He reports feeling a little tired, but is keeping himself busy and staying active. Patient says he is currently making banana bread with blueberry and chocolate chips for fun. Reports food smells great, but he is unable to taste anything besides peppermints. Patient is drinking a lot of water, some coffee and CIB at least one everyday, sometimes he will drink 2. He is not eating by mouth. Patient has increased tube feedings to 4 cartons/day, reports tolerating well. He has occasional nausea, taking compazine which works well for him. Feels "slightly" constipated, he is taking stool softener. Patient reports his mouth is very dry, asking if there is anything he can do to help this. Ear pain is gone, states he is not having much pain at all.   Medications: reviewed  Labs: reviewed  Anthropometrics: Weight 142 lb 12.8 oz on 9/27 decreased 2% (3 lbs) in 7 days. This is signficant  9/20 - 145 lb 15.1 oz 9/13 - 144 lb 13.5 oz 9/6 - 146 lb 9.6 oz  NUTRITION DIAGNOSIS: Inadequate oral intake ongoing, pt relying on tube feedings   INTERVENTION:  Patient has increased tube feedings to 4 cartons Osmolite 1.5  Encouraged patient to increase to 5 cartons/day as tolerated to promote weight stability Continue drinking 2 CIB with whole milk daily as tolerated for added calories and protein Continue baking soda, salt water rinses several times/day Reviewed strategies for dry mouth Provided support and encouragement Patient has contact information    MONITORING, EVALUATION, GOAL: weight trends, intake   NEXT VISIT: Thursday October 6 via telephone

## 2021-02-02 DIAGNOSIS — Z905 Acquired absence of kidney: Secondary | ICD-10-CM | POA: Diagnosis not present

## 2021-02-02 DIAGNOSIS — E785 Hyperlipidemia, unspecified: Secondary | ICD-10-CM | POA: Diagnosis not present

## 2021-02-02 DIAGNOSIS — Z87891 Personal history of nicotine dependence: Secondary | ICD-10-CM | POA: Diagnosis not present

## 2021-02-02 DIAGNOSIS — I10 Essential (primary) hypertension: Secondary | ICD-10-CM | POA: Diagnosis not present

## 2021-02-02 DIAGNOSIS — C098 Malignant neoplasm of overlapping sites of tonsil: Secondary | ICD-10-CM | POA: Diagnosis not present

## 2021-02-02 DIAGNOSIS — C099 Malignant neoplasm of tonsil, unspecified: Secondary | ICD-10-CM | POA: Diagnosis not present

## 2021-02-02 DIAGNOSIS — C770 Secondary and unspecified malignant neoplasm of lymph nodes of head, face and neck: Secondary | ICD-10-CM | POA: Diagnosis not present

## 2021-02-02 DIAGNOSIS — Z85528 Personal history of other malignant neoplasm of kidney: Secondary | ICD-10-CM | POA: Diagnosis not present

## 2021-02-02 DIAGNOSIS — Z8249 Family history of ischemic heart disease and other diseases of the circulatory system: Secondary | ICD-10-CM | POA: Diagnosis not present

## 2021-02-03 DIAGNOSIS — C099 Malignant neoplasm of tonsil, unspecified: Secondary | ICD-10-CM | POA: Diagnosis not present

## 2021-02-05 DIAGNOSIS — C099 Malignant neoplasm of tonsil, unspecified: Secondary | ICD-10-CM | POA: Diagnosis not present

## 2021-02-05 DIAGNOSIS — E785 Hyperlipidemia, unspecified: Secondary | ICD-10-CM | POA: Diagnosis not present

## 2021-02-05 DIAGNOSIS — Z905 Acquired absence of kidney: Secondary | ICD-10-CM | POA: Diagnosis not present

## 2021-02-05 DIAGNOSIS — Z87891 Personal history of nicotine dependence: Secondary | ICD-10-CM | POA: Diagnosis not present

## 2021-02-05 DIAGNOSIS — Z85528 Personal history of other malignant neoplasm of kidney: Secondary | ICD-10-CM | POA: Diagnosis not present

## 2021-02-05 DIAGNOSIS — C098 Malignant neoplasm of overlapping sites of tonsil: Secondary | ICD-10-CM | POA: Diagnosis not present

## 2021-02-05 DIAGNOSIS — I10 Essential (primary) hypertension: Secondary | ICD-10-CM | POA: Diagnosis not present

## 2021-02-05 DIAGNOSIS — C770 Secondary and unspecified malignant neoplasm of lymph nodes of head, face and neck: Secondary | ICD-10-CM | POA: Diagnosis not present

## 2021-02-05 DIAGNOSIS — Z8249 Family history of ischemic heart disease and other diseases of the circulatory system: Secondary | ICD-10-CM | POA: Diagnosis not present

## 2021-02-05 NOTE — Progress Notes (Signed)
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 There is preferential submucosal growth and the mass is indistinguishable from the left prevertebral space and lower left stylopharyngeaus. No convincing adenopathy. 2. Mixed density right upper lobe nodule, attention on anticipated PET.   11/29/2020 Initial Diagnosis   Tonsil cancer ([place])   11/29/2020 Cancer Staging   Staging form: Pharynx - HPV-Mediated Oropharynx, AJCC 8th Edition - Clinical stage from 11/29/2020: cT4, p16+ - Signed by [name], MD on 11/29/2020 Stage prefix: Initial diagnosis   01/09/2021 -  Chemotherapy   Patient is on Treatment Plan : HEAD/NECK Cisplatin q7d       CANCER STAGING: Cancer Staging Tonsil cancer [facility]) Staging form: Pharynx - HPV-Mediated Oropharynx, AJCC 8th Edition - Clinical stage from 11/29/2020: cT4, p16+ - Signed by [name], MD on 11/29/2020   INTERVAL HISTORY:  Mr. Douglas [name], a 75 y.o. male, returns for routine follow-up and consideration for next cycle of chemotherapy. Douglas [name] was last seen on 01/30/2021.  Due for cycle #5 of Cisplatin today.   Overall, he tells me he has been feeling pretty well. He reports continues loss of taste and he is drinking 4.5 cans of  Osmolite a day. He denies tinnitus, vomiting, and trouble swallowing, and he reports numbness in his left hip, nausea for which he is taking Zofran and Compazine, dizziness, and headaches. He is due to complete radiation on 10/17.   Overall, he feels ready for next cycle of chemo today.   REVIEW OF SYSTEMS:  Review of Systems  Constitutional:  Positive for appetite change (25%). Negative for fatigue.  HENT:   Negative for tinnitus and trouble swallowing.   Gastrointestinal:  Positive for constipation and nausea. Negative for vomiting.  Neurological:  Positive for dizziness, headaches and numbness (L hip).  Psychiatric/Behavioral:  Positive for sleep disturbance.   All other systems reviewed and are negative.  PAST MEDICAL/SURGICAL HISTORY:  Past Medical History:  Diagnosis Date   Arthritis    Cancer ([place])    Skin, and Kidney   Hypertension    Pre-diabetes    Skin cancer    Past Surgical History:  Procedure Laterality Date   COLONOSCOPY     HERNIA REPAIR     IR GASTROSTOMY TUBE MOD SED  12/11/2020   IR IMAGING GUIDED PORT INSERTION  12/11/2020   LAPAROSCOPIC PARTIAL NEPHRECTOMY Left    PARTIAL NEPHRECTOMY Left 2009   REVERSE SHOULDER ARTHROPLASTY Right 09/10/2019   Procedure: REVERSE SHOULDER ARTHROPLASTY;  Surgeon: [name], MD;  Location: [facility];  Service: Orthopedics;  Laterality: Right;  interscalene block   SKIN SURGERY      SOCIAL HISTORY:  Social History   Socioeconomic History   Marital status: Widowed    Spouse name: Not  on file   Number of children: Not on file   Years of education: Not on file   Highest education level: Not on file  Occupational History    Employer: [employer]  Tobacco Use   Smoking status: Former    Packs/day: 1.00    Years: 20.00    Pack years: 20.00    Types: Cigarettes    Quit date: 2010    Years since quitting: 12.7   Smokeless tobacco: Never  Vaping Use   Vaping Use: Never used  Substance and Sexual Activity   Alcohol use:  Yes    Alcohol/week: 1.0 standard drink    Types: 1 Cans of beer per week    Comment: occ   Drug use: Never   Sexual activity: Not on file  Other Topics Concern   Not on file  Social History Narrative   Not on file   Social Determinants of Health   Financial Resource Strain: Low Risk    Difficulty of Paying Living Expenses: Not hard at all  Food Insecurity: No Food Insecurity   Worried About Charity fundraiser in the Last Year: Never true   [place] in the Last Year: Never true  Transportation Needs: No Transportation Needs   Lack of Transportation (Medical): No   Lack of Transportation (Non-Medical): No  Physical Activity: Sufficiently Active   Days of Exercise per Week: 7 days   Minutes of Exercise per Session: 30 min  Stress: No Stress Concern Present   Feeling of Stress : Not at all  Social Connections: Socially Isolated   Frequency of Communication with Friends and Family: More than three times a week   Frequency of Social Gatherings with Friends and Family: More than three times a week   Attends Religious Services: Never   Marine scientist or Organizations: No   Attends Archivist Meetings: Never   Marital Status: Widowed  Human resources officer Violence: Not At Risk   Fear of Current or Ex-Partner: No   Emotionally Abused: No   Physically Abused: No   Sexually Abused: No    FAMILY HISTORY:  Family History  Problem Relation Age of Onset   Diabetes Mother    Pulmonary embolism Father    Lung cancer Sister    Lung cancer Sister    Diabetes Brother    Lung cancer Brother     CURRENT MEDICATIONS:  Current Outpatient Medications  Medication Sig Dispense Refill   acetaminophen-codeine (TYLENOL #3) 300-30 MG tablet      amLODipine (NORVASC) 10 MG tablet Take 10 mg by mouth daily.     atenolol (TENORMIN) 25 MG tablet Take 25 mg by mouth daily.     cholecalciferol (VITAMIN D3) 25 MCG (1000 UNIT) tablet Take 1,000 Units by mouth daily.      CISPLATIN IV Inject 40 mg/m2 into the vein once a week.     diclofenac (VOLTAREN) 75 MG EC tablet Take 75 mg by mouth 2 (two) times daily.     HYDROcodone-acetaminophen (NORCO) 5-325 MG tablet Take 1 tablet by mouth every 8 (eight) hours as needed for moderate pain. (Patient not taking: Reported on 01/30/2021) 30 tablet 0   lidocaine-prilocaine (EMLA) cream Apply to affected area once 30 g 3   lisinopril (ZESTRIL) 40 MG tablet Take 40 mg by mouth daily.     magic mouthwash w/lidocaine SOLN Take 5 mLs by mouth 4 (four) times daily as needed for mouth pain. 250 mL 3  Menaquinone-7 (VITAMIN K2 PO) Take 1 tablet by mouth daily.     Multiple Vitamins-Minerals (MULTIVITAMIN WITH MINERALS) tablet Take 1 tablet by mouth daily.     Nutritional Supplements (FEEDING SUPPLEMENT, OSMOLITE 1.5 CAL,) LIQD Give 6 cartons Osmolite 1.5 via tube split over 4 feedings/day. Flush tube with 60 ml water before and after each bolus feeding. Drink by mouth or give via tube additional 2.5 c fluids/day. This provides 2130 kcal, 89 grams protein, 2159 ml total water. Meets 100% needs.  0   ondansetron (ZOFRAN) 8 MG tablet Take 1 tablet (8 mg total) by mouth 2 (two) times daily as needed. Start on the third day after cisplatin chemotherapy. (Patient not taking: Reported on 01/30/2021) 30 tablet 1   prochlorperazine (COMPAZINE) 10 MG tablet Take 1 tablet (10 mg total) by mouth every 6 (six) hours as needed (Nausea or vomiting). (Patient not taking: Reported on 01/30/2021) 30 tablet 1   simvastatin (ZOCOR) 20 MG tablet Take 20 mg by mouth daily.     tamsulosin (FLOMAX) 0.4 MG CAPS capsule Take 0.8 mg by mouth daily.     zinc gluconate 50 MG tablet Take 50 mg by mouth daily.     No current facility-administered medications for this visit.    ALLERGIES:  No Known Allergies  PHYSICAL EXAM:  Performance status (ECOG): 0 - Asymptomatic  There were no vitals filed for this visit. Wt Readings from Last 3 Encounters:  01/30/21 142  lb 12.8 oz (64.8 kg)  01/23/21 145 lb 15.1 oz (66.2 kg)  01/16/21 144 lb 13.5 oz (65.7 kg)   Physical Exam Vitals reviewed.  Constitutional:      Appearance: Normal appearance.  Cardiovascular:     Rate and Rhythm: Normal rate and regular rhythm.     Pulses: Normal pulses.     Heart sounds: Normal heart sounds.  Pulmonary:     Effort: Pulmonary effort is normal.     Breath sounds: Normal breath sounds.  Lymphadenopathy:     Cervical: No cervical adenopathy.     Right cervical: No superficial, deep or posterior cervical adenopathy.    Left cervical: No superficial, deep or posterior cervical adenopathy.  Neurological:     General: No focal deficit present.     Mental Status: He is alert and oriented to person, place, and time.  Psychiatric:        Mood and Affect: Mood normal.        Behavior: Behavior normal.    LABORATORY DATA:  I have reviewed the labs as listed.  CBC Latest Ref Rng & Units 01/30/2021 01/23/2021 01/16/2021  WBC 4.0 - 10.5 K/uL 7.0 10.2 9.9  Hemoglobin 13.0 - 17.0 g/dL 12.2(L) 12.9(L) 12.7(L)  Hematocrit 39.0 - 52.0 % 37.1(L) 38.5(L) 39.3  Platelets 150 - 400 K/uL 202 211 191   CMP Latest Ref Rng & Units 01/30/2021 01/23/2021 01/16/2021  Glucose 70 - 99 mg/dL 120(H) 96 110(H)  BUN 8 - 23 mg/dL 29(H) 30(H) 26(H)  Creatinine 0.61 - 1.24 mg/dL 0.98 1.02 1.16  Sodium 135 - 145 mmol/L 135 137 140  Potassium 3.5 - 5.1 mmol/L 4.0 4.2 4.2  Chloride 98 - 111 mmol/L 101 104 101  CO2 22 - 32 mmol/L 28 29 30   Calcium 8.9 - 10.3 mg/dL 8.9 8.6(L) 9.2  Total Protein 6.5 - 8.1 g/dL 6.6 6.8 6.6  Total Bilirubin 0.3 - 1.2 mg/dL 0.6 0.6 0.6  Alkaline Phos 38 - 126 U/L 82 87 91  AST 15 -  41 U/L 21 22 23   ALT 0 - 44 U/L 22 25 26     DIAGNOSTIC IMAGING:  I have independently reviewed the scans and discussed with the patient. No results found.   ASSESSMENT:  1.  Left tonsillar squamous cell cancer: - Left tonsillar biopsy by Dr. Benjamine Mola on 11/15/2020 consistent with squamous  cell carcinoma.  P16 is strongly positive. - PET scan on 12/07/2020-shows 4.1 cm left palatine tonsil mass with SUV 16.  0.8 cm left level 3 lymph node SUV 4.4.  Elongated 0.9 x 1 cm right lower lobe nodule along the azygoesophageal recess has accentuated metabolic activity with SUV 5.8.  This has some solid component and was not visible on chest CT of 04/24/2020.  Differential includes unusual morphology of metastatic disease versus inflammatory process.  Groundglass opacity laterally in the right upper lobe likely inflammatory with SUV 1.4.  0.6 cm right middle lobe nodule not hypermetabolic. - Concurrent chemoradiation therapy started on 01/09/2021. 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  Hearing on the left side is slightly worse due to tumor.  2.  Social/family history: - He has a retired after working for [employer].  He quit smoking 12 years ago.  He smoked 2 packs/day for 20 years. - Sister had lung cancer.  2 sisters died of lung cancer.  Brother had lung cancer.   PLAN:  1.  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  He has radiation therapy until 02/19/2021. - He had occasional nausea but denied any vomiting.  He took Zofran which caused him some dizziness. - Physical examination today shows left tonsillar mass significantly improved.  No lymph node palpable on the left side of the neck. - He reported on and off left hip numbness when he lies down.  No numbness in the fingertips or toes. - Reviewed labs today which showed normal creatinine and LFTs.  Electrolytes are normal.  CBC shows normal white count and platelets. - Proceed with cisplatin today with pre and post hydration.  RTC 1 week for follow-up.  2.  Left ear and headache: - He is no longer requiring hydrocodone.  3.  Nutrition: - He has increased his nutrition to 4 and half cans of Osmolite daily. - He is eating small  quantities of eggs, bread.  No significant pain on swallowing. - His weight improved by 2 pounds since last week.   Orders placed this encounter:  No orders of the defined types were placed in this encounter.    Derek Uzoma, MD [place] [phone]   I, Thana Ates, am acting as a scribe for Dr. Derek Draydon.  I, Derek Kholton MD, have reviewed the above documentation for accuracy and completeness, and I agree with the above.

## 2021-02-06 ENCOUNTER — Other Ambulatory Visit: Payer: Self-pay

## 2021-02-06 ENCOUNTER — Inpatient Hospital Stay (HOSPITAL_COMMUNITY): Payer: Medicare HMO | Admitting: Hematology

## 2021-02-06 ENCOUNTER — Inpatient Hospital Stay (HOSPITAL_COMMUNITY): Payer: Medicare HMO

## 2021-02-06 VITALS — BP 156/87 | HR 59 | Temp 96.8°F | Resp 18

## 2021-02-06 DIAGNOSIS — R519 Headache, unspecified: Secondary | ICD-10-CM | POA: Insufficient documentation

## 2021-02-06 DIAGNOSIS — Z801 Family history of malignant neoplasm of trachea, bronchus and lung: Secondary | ICD-10-CM | POA: Insufficient documentation

## 2021-02-06 DIAGNOSIS — I10 Essential (primary) hypertension: Secondary | ICD-10-CM | POA: Insufficient documentation

## 2021-02-06 DIAGNOSIS — G620 Drug-induced polyneuropathy: Secondary | ICD-10-CM | POA: Insufficient documentation

## 2021-02-06 DIAGNOSIS — R1312 Dysphagia, oropharyngeal phase: Secondary | ICD-10-CM | POA: Diagnosis not present

## 2021-02-06 DIAGNOSIS — C099 Malignant neoplasm of tonsil, unspecified: Secondary | ICD-10-CM

## 2021-02-06 DIAGNOSIS — C098 Malignant neoplasm of overlapping sites of tonsil: Secondary | ICD-10-CM | POA: Diagnosis not present

## 2021-02-06 DIAGNOSIS — C770 Secondary and unspecified malignant neoplasm of lymph nodes of head, face and neck: Secondary | ICD-10-CM | POA: Diagnosis not present

## 2021-02-06 DIAGNOSIS — Z8249 Family history of ischemic heart disease and other diseases of the circulatory system: Secondary | ICD-10-CM | POA: Diagnosis not present

## 2021-02-06 DIAGNOSIS — T451X5A Adverse effect of antineoplastic and immunosuppressive drugs, initial encounter: Secondary | ICD-10-CM | POA: Insufficient documentation

## 2021-02-06 DIAGNOSIS — Z5111 Encounter for antineoplastic chemotherapy: Secondary | ICD-10-CM | POA: Insufficient documentation

## 2021-02-06 DIAGNOSIS — R252 Cramp and spasm: Secondary | ICD-10-CM | POA: Diagnosis not present

## 2021-02-06 DIAGNOSIS — Z79899 Other long term (current) drug therapy: Secondary | ICD-10-CM | POA: Insufficient documentation

## 2021-02-06 DIAGNOSIS — Z85828 Personal history of other malignant neoplasm of skin: Secondary | ICD-10-CM | POA: Diagnosis not present

## 2021-02-06 DIAGNOSIS — Z905 Acquired absence of kidney: Secondary | ICD-10-CM | POA: Diagnosis not present

## 2021-02-06 DIAGNOSIS — R1310 Dysphagia, unspecified: Secondary | ICD-10-CM | POA: Diagnosis not present

## 2021-02-06 DIAGNOSIS — Z87891 Personal history of nicotine dependence: Secondary | ICD-10-CM | POA: Insufficient documentation

## 2021-02-06 DIAGNOSIS — Z85528 Personal history of other malignant neoplasm of kidney: Secondary | ICD-10-CM | POA: Diagnosis not present

## 2021-02-06 DIAGNOSIS — E785 Hyperlipidemia, unspecified: Secondary | ICD-10-CM | POA: Diagnosis not present

## 2021-02-06 LAB — COMPREHENSIVE METABOLIC PANEL
ALT: 24 U/L (ref 0–44)
AST: 21 U/L (ref 15–41)
Albumin: 3.7 g/dL (ref 3.5–5.0)
Alkaline Phosphatase: 84 U/L (ref 38–126)
Anion gap: 7 (ref 5–15)
BUN: 31 mg/dL — ABNORMAL HIGH (ref 8–23)
CO2: 29 mmol/L (ref 22–32)
Calcium: 8.9 mg/dL (ref 8.9–10.3)
Chloride: 99 mmol/L (ref 98–111)
Creatinine, Ser: 0.94 mg/dL (ref 0.61–1.24)
GFR, Estimated: >60 mL/min (ref >60)
Glucose, Bld: 125 mg/dL — ABNORMAL HIGH (ref 70–99)
Potassium: 4.3 mmol/L (ref 3.5–5.1)
Sodium: 135 mmol/L (ref 135–145)
Total Bilirubin: 0.6 mg/dL (ref 0.3–1.2)
Total Protein: 6.6 g/dL (ref 6.5–8.1)

## 2021-02-06 LAB — MAGNESIUM: Magnesium: 2.2 mg/dL (ref 1.7–2.4)

## 2021-02-06 LAB — CBC WITH DIFFERENTIAL/PLATELET
Abs Immature Granulocytes: 0.02 10*3/uL (ref 0.00–0.07)
Basophils Absolute: 0.1 10*3/uL (ref 0.0–0.1)
Basophils Relative: 1 %
Eosinophils Absolute: 0.3 10*3/uL (ref 0.0–0.5)
Eosinophils Relative: 4 %
HCT: 35.6 % — ABNORMAL LOW (ref 39.0–52.0)
Hemoglobin: 12 g/dL — ABNORMAL LOW (ref 13.0–17.0)
Immature Granulocytes: 0 %
Lymphocytes Relative: 11 %
Lymphs Abs: 0.7 10*3/uL (ref 0.7–4.0)
MCH: 32.9 pg (ref 26.0–34.0)
MCHC: 33.7 g/dL (ref 30.0–36.0)
MCV: 97.5 fL (ref 80.0–100.0)
Monocytes Absolute: 0.8 10*3/uL (ref 0.1–1.0)
Monocytes Relative: 12 %
Neutro Abs: 4.7 10*3/uL (ref 1.7–7.7)
Neutrophils Relative %: 72 %
Platelets: 170 10*3/uL (ref 150–400)
RBC: 3.65 MIL/uL — ABNORMAL LOW (ref 4.22–5.81)
RDW: 13.1 % (ref 11.5–15.5)
WBC: 6.5 10*3/uL (ref 4.0–10.5)
nRBC: 0 % (ref 0.0–0.2)

## 2021-02-06 MED ORDER — SODIUM CHLORIDE 0.9 % IV SOLN: Freq: Once | INTRAVENOUS | Status: AC

## 2021-02-06 MED ORDER — SODIUM CHLORIDE 0.9 % IV SOLN
40.0000 mg/m2 | Freq: Once | INTRAVENOUS | Status: AC
  Administered 2021-02-06: 71 mg via INTRAVENOUS
  Filled 2021-02-06: qty 71

## 2021-02-06 MED ORDER — PALONOSETRON HCL INJECTION 0.25 MG/5ML
0.2500 mg | Freq: Once | INTRAVENOUS | Status: AC
Start: 2021-02-06 — End: 2021-02-06
  Administered 2021-02-06: 0.25 mg via INTRAVENOUS
  Filled 2021-02-06: qty 5

## 2021-02-06 MED ORDER — SODIUM CHLORIDE 0.9 % IV SOLN
150.0000 mg | Freq: Once | INTRAVENOUS | Status: AC
  Administered 2021-02-06: 150 mg via INTRAVENOUS
  Filled 2021-02-06: qty 150

## 2021-02-06 MED ORDER — SODIUM CHLORIDE 0.9% FLUSH
10.0000 mL | INTRAVENOUS | Status: DC | PRN
Start: 2021-02-06 — End: 2021-02-06
  Administered 2021-02-06: 10 mL

## 2021-02-06 MED ORDER — POTASSIUM CHLORIDE IN NACL 20-0.9 MEQ/L-% IV SOLN
Freq: Once | INTRAVENOUS | Status: AC
  Filled 2021-02-06: qty 1000

## 2021-02-06 MED ORDER — HEPARIN SOD (PORK) LOCK FLUSH 100 UNIT/ML IV SOLN
500.0000 [IU] | Freq: Once | INTRAVENOUS | Status: AC | PRN
Start: 2021-02-06 — End: 2021-02-06
  Administered 2021-02-06: 500 [IU]

## 2021-02-06 MED ORDER — MAGNESIUM SULFATE 2 GM/50ML IV SOLN
2.0000 g | Freq: Once | INTRAVENOUS | Status: AC
Start: 2021-02-06 — End: 2021-02-06
  Administered 2021-02-06: 2 g via INTRAVENOUS
  Filled 2021-02-06: qty 50

## 2021-02-06 MED ORDER — SODIUM CHLORIDE 0.9 % IV SOLN
10.0000 mg | Freq: Once | INTRAVENOUS | Status: AC
  Administered 2021-02-06: 10 mg via INTRAVENOUS
  Filled 2021-02-06: qty 10

## 2021-02-06 NOTE — Progress Notes (Signed)
Patient has been examined, vital signs and labs have been reviewed by Dr. Katragadda. ANC, Creatinine, LFTs, hemoglobin, and platelets are within treatment parameters per Dr. Katragadda. Patient may proceed with treatment per M.D.   

## 2021-02-06 NOTE — Progress Notes (Signed)
Port flushed with good blood return noted. No bruising or swelling at site. Patient awaiting labs for treatment.  

## 2021-02-06 NOTE — Progress Notes (Signed)
Patient tolerated chemotherapy with no complaints voiced. Side effects with management reviewed understanding verbalized. Port site clean and dry with no bruising or swelling noted at site. Good blood return noted before and after administration of chemotherapy. Band aid applied. Patient left in satisfactory condition with VSS and no s/s of distress noted. 

## 2021-02-06 NOTE — Patient Instructions (Addendum)
[facility] at [facility] Discharge Instructions  You were seen today by Dr. Katragadda. He went over your recent results, and you received your treatment. Dr. Katragadda will see you back in 1 week for labs and follow up.   Thank you for choosing [facility] at [facility] to provide your oncology and hematology care.  To afford each patient quality time with our provider, please arrive at least 15 minutes before your scheduled appointment time.   If you have a lab appointment with the Cancer Center please come in thru the Main Entrance and check in at the main information desk  You need to re-schedule your appointment should you arrive 10 or more minutes late.  We strive to give you quality time with our providers, and arriving late affects you and other patients whose appointments are after yours.  Also, if you no show three or more times for appointments you may be dismissed from the clinic at the providers discretion.     Again, thank you for choosing [facility].  Our hope is that these requests will decrease the amount of time that you wait before being seen by our physicians.       _____________________________________________________________  Should you have questions after your visit to  Cancer Center, please contact our office at [phone] between the hours of 8:00 a.m. and 4:30 p.m.  Voicemails left after 4:00 p.m. will not be returned until the following business day.  For prescription refill requests, have your pharmacy contact our office and allow 72 hours.    Cancer Center Support Programs:   > Cancer Support Group  2nd Tuesday of the month 1pm-2pm, Journey Room    

## 2021-02-06 NOTE — Patient Instructions (Signed)
[facility]  Discharge Instructions: Thank you for choosing [facility] to provide your oncology and hematology care.  If you have a lab appointment with the [facility], please come in thru the Main Entrance and check in at the main information desk.  Wear comfortable clothing and clothing appropriate for easy access to any Portacath or PICC line.   We strive to give you quality time with your provider. You may need to reschedule your appointment if you arrive late (15 or more minutes).  Arriving late affects you and other patients whose appointments are after yours.  Also, if you miss three or more appointments without notifying the office, you may be dismissed from the clinic at the provider's discretion.      For prescription refill requests, have your pharmacy contact our office and allow 72 hours for refills to be completed.    Today you received the following chemotherapy and/or immunotherapy agents Cisplatin. Return as scheduled.   To help prevent nausea and vomiting after your treatment, we encourage you to take your nausea medication as directed.  BELOW ARE SYMPTOMS THAT SHOULD BE REPORTED IMMEDIATELY: *FEVER GREATER THAN 100.4 F (38 C) OR HIGHER *CHILLS OR SWEATING *NAUSEA AND VOMITING THAT IS NOT CONTROLLED WITH YOUR NAUSEA MEDICATION *UNUSUAL SHORTNESS OF BREATH *UNUSUAL BRUISING OR BLEEDING *URINARY PROBLEMS (pain or burning when urinating, or frequent urination) *BOWEL PROBLEMS (unusual diarrhea, constipation, pain near the anus) TENDERNESS IN MOUTH AND THROAT WITH OR WITHOUT PRESENCE OF ULCERS (sore throat, sores in mouth, or a toothache) UNUSUAL RASH, SWELLING OR PAIN  UNUSUAL VAGINAL DISCHARGE OR ITCHING   Items with * indicate a potential emergency and should be followed up as soon as possible or go to the Emergency Department if any problems should occur.  Please show the CHEMOTHERAPY ALERT CARD or IMMUNOTHERAPY ALERT CARD at check-in to  the Emergency Department and triage nurse.  Should you have questions after your visit or need to cancel or reschedule your appointment, please contact [facility] [phone]  and follow the prompts.  Office hours are 8:00 a.m. to 4:30 p.m. Monday - Friday. Please note that voicemails left after 4:00 p.m. may not be returned until the following business day.  We are closed weekends and major holidays. You have access to a nurse at all times for urgent questions. Please call the main number to the clinic [phone] and follow the prompts.  For any non-urgent questions, you may also contact your provider using MyChart. We now offer e-Visits for anyone 30 and older to request care online for non-urgent symptoms. For details visit [email or web address].   Also download the MyChart app! Go to the app store, search "MyChart", open the app, select [facility], and log in with your MyChart username and password.  Due to Covid, a mask is required upon entering the hospital/clinic. If you do not have a mask, one will be given to you upon arrival. For doctor visits, patients may have 1 support person aged 73 or older with them. For treatment visits, patients cannot have anyone with them due to current Covid guidelines and our immunocompromised population.

## 2021-02-07 DIAGNOSIS — Z905 Acquired absence of kidney: Secondary | ICD-10-CM | POA: Diagnosis not present

## 2021-02-07 DIAGNOSIS — Z8249 Family history of ischemic heart disease and other diseases of the circulatory system: Secondary | ICD-10-CM | POA: Diagnosis not present

## 2021-02-07 DIAGNOSIS — C099 Malignant neoplasm of tonsil, unspecified: Secondary | ICD-10-CM | POA: Diagnosis not present

## 2021-02-07 DIAGNOSIS — C098 Malignant neoplasm of overlapping sites of tonsil: Secondary | ICD-10-CM | POA: Diagnosis not present

## 2021-02-07 DIAGNOSIS — Z85528 Personal history of other malignant neoplasm of kidney: Secondary | ICD-10-CM | POA: Diagnosis not present

## 2021-02-07 DIAGNOSIS — C770 Secondary and unspecified malignant neoplasm of lymph nodes of head, face and neck: Secondary | ICD-10-CM | POA: Diagnosis not present

## 2021-02-07 DIAGNOSIS — E785 Hyperlipidemia, unspecified: Secondary | ICD-10-CM | POA: Diagnosis not present

## 2021-02-07 DIAGNOSIS — Z87891 Personal history of nicotine dependence: Secondary | ICD-10-CM | POA: Diagnosis not present

## 2021-02-07 DIAGNOSIS — I10 Essential (primary) hypertension: Secondary | ICD-10-CM | POA: Diagnosis not present

## 2021-02-08 ENCOUNTER — Telehealth (HOSPITAL_COMMUNITY): Payer: Self-pay | Admitting: Dietician

## 2021-02-08 ENCOUNTER — Inpatient Hospital Stay (HOSPITAL_COMMUNITY): Payer: Medicare HMO | Admitting: Dietician

## 2021-02-08 ENCOUNTER — Other Ambulatory Visit (HOSPITAL_COMMUNITY): Payer: Self-pay

## 2021-02-08 DIAGNOSIS — E785 Hyperlipidemia, unspecified: Secondary | ICD-10-CM | POA: Diagnosis not present

## 2021-02-08 DIAGNOSIS — I10 Essential (primary) hypertension: Secondary | ICD-10-CM | POA: Diagnosis not present

## 2021-02-08 DIAGNOSIS — C099 Malignant neoplasm of tonsil, unspecified: Secondary | ICD-10-CM

## 2021-02-08 DIAGNOSIS — C098 Malignant neoplasm of overlapping sites of tonsil: Secondary | ICD-10-CM | POA: Diagnosis not present

## 2021-02-08 DIAGNOSIS — Z905 Acquired absence of kidney: Secondary | ICD-10-CM | POA: Diagnosis not present

## 2021-02-08 DIAGNOSIS — Z8249 Family history of ischemic heart disease and other diseases of the circulatory system: Secondary | ICD-10-CM | POA: Diagnosis not present

## 2021-02-08 DIAGNOSIS — Z87891 Personal history of nicotine dependence: Secondary | ICD-10-CM | POA: Diagnosis not present

## 2021-02-08 DIAGNOSIS — C770 Secondary and unspecified malignant neoplasm of lymph nodes of head, face and neck: Secondary | ICD-10-CM | POA: Diagnosis not present

## 2021-02-08 DIAGNOSIS — Z85528 Personal history of other malignant neoplasm of kidney: Secondary | ICD-10-CM | POA: Diagnosis not present

## 2021-02-08 NOTE — Telephone Encounter (Signed)
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  Spoke with patient via telephone. He just returned home from radiation, says he has 12 treatments left. He reports mild throat irritation and intermittent hoarseness. Patient is using lidocaine rinse as needed. Patient continues pushing himself to eat orally even though he can not taste any foods beside peppermint candies. Yesterday he had 1/2 cup chili, says he could feel the heat. He reports needing to take sips of water while eating to aid with swallowing. Patient is drinking coffee and 2-3 bottles of water. He has increased tube feedings, reports 4-5 cartons Osmolite 1.5 everyday. Patient reports occasional nausea, states zofran makes him feel "funky" so will only take it if he will be staying at home. He is having normal bowel movements. Patient is doing baking soda salt water rinses before bed. This is helping with dry mouth.   Medications: reviewed  Labs: 10/4 Glucose 125, BUN 31  Anthropometrics: Weight 144 lb 1 oz on 10/4 increased from 142 lb 12.8 oz on 9/27  9/20 - 145 lb 15.1 oz 9/13 - 144 lb 13.5 oz 9/6 - 146 lb 9.6 oz   NUTRITION DIAGNOSIS: Inadequate oral intake ongoing, pt relying on tube feedings  INTERVENTION:  Continue giving 5 cartons Osmolite 1.5 via tube daily Encouraged oral intake of soft moist high protein foods as tolerated  Continue baking soda, salt water rinses several times/day and at bedtime Educated on gingerale/lemon-lime soda rinses to aid with thick saliva Encouraged continued activity as able  Patient has contact information     MONITORING, EVALUATION, GOAL: weight trends, intake   NEXT VISIT: Thursday October 13 via telephone (pt aware)

## 2021-02-08 NOTE — Progress Notes (Signed)
Order placed for MBSS after speaking with Speech Therapist. Dr. Delton Coombes aware and agreeable.

## 2021-02-09 ENCOUNTER — Other Ambulatory Visit (HOSPITAL_COMMUNITY): Payer: Self-pay | Admitting: Specialist

## 2021-02-09 DIAGNOSIS — Z8249 Family history of ischemic heart disease and other diseases of the circulatory system: Secondary | ICD-10-CM | POA: Diagnosis not present

## 2021-02-09 DIAGNOSIS — R1319 Other dysphagia: Secondary | ICD-10-CM

## 2021-02-09 DIAGNOSIS — Z87891 Personal history of nicotine dependence: Secondary | ICD-10-CM | POA: Diagnosis not present

## 2021-02-09 DIAGNOSIS — T17908D Unspecified foreign body in respiratory tract, part unspecified causing other injury, subsequent encounter: Secondary | ICD-10-CM

## 2021-02-09 DIAGNOSIS — I10 Essential (primary) hypertension: Secondary | ICD-10-CM | POA: Diagnosis not present

## 2021-02-09 DIAGNOSIS — C770 Secondary and unspecified malignant neoplasm of lymph nodes of head, face and neck: Secondary | ICD-10-CM | POA: Diagnosis not present

## 2021-02-09 DIAGNOSIS — Z905 Acquired absence of kidney: Secondary | ICD-10-CM | POA: Diagnosis not present

## 2021-02-09 DIAGNOSIS — E785 Hyperlipidemia, unspecified: Secondary | ICD-10-CM | POA: Diagnosis not present

## 2021-02-09 DIAGNOSIS — Z85528 Personal history of other malignant neoplasm of kidney: Secondary | ICD-10-CM | POA: Diagnosis not present

## 2021-02-09 DIAGNOSIS — C099 Malignant neoplasm of tonsil, unspecified: Secondary | ICD-10-CM | POA: Diagnosis not present

## 2021-02-09 DIAGNOSIS — C098 Malignant neoplasm of overlapping sites of tonsil: Secondary | ICD-10-CM | POA: Diagnosis not present

## 2021-02-12 DIAGNOSIS — Z85528 Personal history of other malignant neoplasm of kidney: Secondary | ICD-10-CM | POA: Diagnosis not present

## 2021-02-12 DIAGNOSIS — I10 Essential (primary) hypertension: Secondary | ICD-10-CM | POA: Diagnosis not present

## 2021-02-12 DIAGNOSIS — Z87891 Personal history of nicotine dependence: Secondary | ICD-10-CM | POA: Diagnosis not present

## 2021-02-12 DIAGNOSIS — Z905 Acquired absence of kidney: Secondary | ICD-10-CM | POA: Diagnosis not present

## 2021-02-12 DIAGNOSIS — C098 Malignant neoplasm of overlapping sites of tonsil: Secondary | ICD-10-CM | POA: Diagnosis not present

## 2021-02-12 DIAGNOSIS — Z8249 Family history of ischemic heart disease and other diseases of the circulatory system: Secondary | ICD-10-CM | POA: Diagnosis not present

## 2021-02-12 DIAGNOSIS — C099 Malignant neoplasm of tonsil, unspecified: Secondary | ICD-10-CM | POA: Diagnosis not present

## 2021-02-12 DIAGNOSIS — C770 Secondary and unspecified malignant neoplasm of lymph nodes of head, face and neck: Secondary | ICD-10-CM | POA: Diagnosis not present

## 2021-02-12 DIAGNOSIS — E785 Hyperlipidemia, unspecified: Secondary | ICD-10-CM | POA: Diagnosis not present

## 2021-02-12 NOTE — Progress Notes (Signed)
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 There is preferential submucosal growth and the mass is indistinguishable from the left prevertebral space and lower left stylopharyngeaus. No convincing adenopathy. 2. Mixed density right upper lobe nodule, attention on anticipated PET.   11/29/2020 Initial Diagnosis   Tonsil cancer ([place])   11/29/2020 Cancer Staging   Staging form: Pharynx - HPV-Mediated Oropharynx, AJCC 8th Edition - Clinical stage from 11/29/2020: cT4, p16+ - Signed by Douglas Lark, MD on 11/29/2020 Stage prefix: Initial diagnosis   01/09/2021 -  Chemotherapy   Patient is on Treatment Plan : HEAD/NECK Cisplatin q7d       CANCER STAGING: Cancer Staging Tonsil cancer [facility]) Staging form: Pharynx - HPV-Mediated Oropharynx, AJCC 8th Edition - Clinical stage from 11/29/2020: cT4, p16+ - Signed by Douglas Lark, MD on 11/29/2020   INTERVAL HISTORY:  Douglas [name], a 75 y.o. male, returns for routine follow-up and consideration for next cycle of chemotherapy. Douglas [name] was last seen on 02/06/2021.  Due for cycle #6 of Cisplatin today.   Overall, he tells me he has been feeling pretty well. He reports occasionally painful trouble swallowing food as well as  loss of taste and dry mouth. He is drinking 5-6 cartons of Osmolite daily. He reports nausea in the mornings for which he takes Compazine; he denies diarrhea, constipation, and vomiting. He reports burning tingling/numbness at bedtime in his left hip. He denies tinnitus and difficulty sleeping. He completes radiation on 10/24.  Overall, he feels ready for next cycle of chemo today.   REVIEW OF SYSTEMS:  Review of Systems  Constitutional:  Positive for appetite change (depleted). Negative for fatigue (75%).  HENT:   Negative for tinnitus.        Dry mouth, loss of taste  Gastrointestinal:  Positive for nausea. Negative for constipation, diarrhea and vomiting.  Neurological:  Positive for dizziness and numbness (L hip).  Psychiatric/Behavioral:  Negative for sleep disturbance.   All other systems reviewed and are negative.  PAST MEDICAL/SURGICAL HISTORY:  Past Medical History:  Diagnosis Date   Arthritis    Cancer ([place])    Skin, and Kidney   Hypertension    Pre-diabetes    Skin cancer    Past Surgical History:  Procedure Laterality Date   COLONOSCOPY     HERNIA REPAIR     IR GASTROSTOMY TUBE MOD SED  12/11/2020   IR IMAGING GUIDED PORT INSERTION  12/11/2020   LAPAROSCOPIC PARTIAL NEPHRECTOMY Left    PARTIAL NEPHRECTOMY Left 2009   REVERSE SHOULDER ARTHROPLASTY Right 09/10/2019   Procedure: REVERSE SHOULDER ARTHROPLASTY;  Surgeon: [name], MD;  Location: [facility];  Service: Orthopedics;  Laterality: Right;  interscalene block   SKIN SURGERY      SOCIAL  HISTORY:  Social History   Socioeconomic History   Marital status: Widowed    Spouse name: Not on file   Number of children: Not on file   Years of education: Not on file   Highest education level: Not on file  Occupational History    Employer: [employer]  Tobacco Use   Smoking status: Former    Packs/day: 1.00    Years: 20.00    Pack years: 20.00    Types: Cigarettes    Quit date: 2010    Years since quitting: 12.7    Smokeless tobacco: Never  Vaping Use   Vaping Use: Never used  Substance and Sexual Activity   Alcohol use: Yes    Alcohol/week: 1.0 standard drink    Types: 1 Cans of beer per week    Comment: occ   Drug use: Never   Sexual activity: Not on file  Other Topics Concern   Not on file  Social History Narrative   Not on file   Social Determinants of Health   Financial Resource Strain: Low Risk    Difficulty of Paying Living Expenses: Not hard at all  Food Insecurity: No Food Insecurity   Worried About Charity fundraiser in the Last Year: Never true   [place] in the Last Year: Never true  Transportation Needs: No Transportation Needs   Lack of Transportation (Medical): No   Lack of Transportation (Non-Medical): No  Physical Activity: Sufficiently Active   Days of Exercise per Week: 7 days   Minutes of Exercise per Session: 30 min  Stress: No Stress Concern Present   Feeling of Stress : Not at all  Social Connections: Socially Isolated   Frequency of Communication with Friends and Family: More than three times a week   Frequency of Social Gatherings with Friends and Family: More than three times a week   Attends Religious Services: Never   Marine scientist or Organizations: No   Attends Archivist Meetings: Never   Marital Status: Widowed  Human resources officer Violence: Not At Risk   Fear of Current or Ex-Partner: No   Emotionally Abused: No   Physically Abused: No   Sexually Abused: No    FAMILY HISTORY:  Family History  Problem Relation Age of Onset   Diabetes Mother    Pulmonary embolism Father    Lung cancer Sister    Lung cancer Sister    Diabetes Brother    Lung cancer Brother     CURRENT MEDICATIONS:  Current Outpatient Medications  Medication Sig Dispense Refill   acetaminophen-codeine (TYLENOL #3) 300-30 MG tablet      amLODipine (NORVASC) 10 MG tablet Take 10 mg by mouth daily.     atenolol (TENORMIN) 25 MG tablet Take 25 mg by  mouth daily.     cholecalciferol (VITAMIN D3) 25 MCG (1000 UNIT) tablet Take 1,000 Units by mouth daily.     CISPLATIN IV Inject 40 mg/m2 into the vein once a week.     diclofenac (VOLTAREN) 75 MG EC tablet Take 75 mg by mouth 2 (two) times daily.     gabapentin (NEURONTIN) 300 MG capsule Take 1 capsule (300 mg total) by mouth at bedtime. 30 capsule 1   HYDROcodone-acetaminophen (NORCO) 5-325 MG tablet Take 1 tablet by mouth every 8 (eight) hours as needed for moderate pain. 30 tablet 0   lisinopril (ZESTRIL) 40 MG tablet Take 40 mg by mouth daily.     magic  mouthwash w/lidocaine SOLN Take 5 mLs by mouth 4 (four) times daily as needed for mouth pain. 250 mL 3   Menaquinone-7 (VITAMIN K2 PO) Take 1 tablet by mouth daily.     Multiple Vitamins-Minerals (MULTIVITAMIN WITH MINERALS) tablet Take 1 tablet by mouth daily.     Nutritional Supplements (FEEDING SUPPLEMENT, OSMOLITE 1.5 CAL,) LIQD Give 6 cartons Osmolite 1.5 via tube split over 4 feedings/day. Flush tube with 60 ml water before and after each bolus feeding. Drink by mouth or give via tube additional 2.5 c fluids/day. This provides 2130 kcal, 89 grams protein, 2159 ml total water. Meets 100% needs.  0   prochlorperazine (COMPAZINE) 10 MG tablet Take 1 tablet (10 mg total) by mouth every 6 (six) hours as needed (Nausea or vomiting). 30 tablet 1   simvastatin (ZOCOR) 20 MG tablet Take 20 mg by mouth daily.     tamsulosin (FLOMAX) 0.4 MG CAPS capsule Take 0.8 mg by mouth daily.     zinc gluconate 50 MG tablet Take 50 mg by mouth daily.     lidocaine-prilocaine (EMLA) cream Apply to affected area once (Patient not taking: Reported on 02/13/2021) 30 g 3   ondansetron (ZOFRAN) 8 MG tablet Take 1 tablet (8 mg total) by mouth 2 (two) times daily as needed. Start on the third day after cisplatin chemotherapy. (Patient not taking: Reported on 02/13/2021) 30 tablet 1   No current facility-administered medications for this visit.    ALLERGIES:  No  Known Allergies  PHYSICAL EXAM:  Performance status (ECOG): 0 - Asymptomatic  Vitals:   02/13/21 0907  BP: (!) 141/57  Pulse: (!) 57  Resp: 17  Temp: (!) 96.9 F (36.1 C)  SpO2: 100%   Wt Readings from Last 3 Encounters:  02/13/21 143 lb 3.2 oz (65 kg)  02/06/21 144 lb 1 oz (65.3 kg)  01/30/21 142 lb 12.8 oz (64.8 kg)   Physical Exam Vitals reviewed.  Constitutional:      Appearance: Normal appearance.  HENT:     Mouth/Throat:     Comments: Left tonsillar mass no longer seen Cardiovascular:     Rate and Rhythm: Normal rate and regular rhythm.     Pulses: Normal pulses.     Heart sounds: Normal heart sounds.  Pulmonary:     Effort: Pulmonary effort is normal.     Breath sounds: Normal breath sounds.  Lymphadenopathy:     Cervical: No cervical adenopathy.     Right cervical: No superficial, deep or posterior cervical adenopathy.    Left cervical: No superficial, deep or posterior cervical adenopathy.  Neurological:     General: No focal deficit present.     Mental Status: He is alert and oriented to person, place, and time.  Psychiatric:        Mood and Affect: Mood normal.        Behavior: Behavior normal.    LABORATORY DATA:  I have reviewed the labs as listed.  CBC Latest Ref Rng & Units 02/13/2021 02/06/2021 01/30/2021  WBC 4.0 - 10.5 K/uL 6.1 6.5 7.0  Hemoglobin 13.0 - 17.0 g/dL 11.6(L) 12.0(L) 12.2(L)  Hematocrit 39.0 - 52.0 % 34.3(L) 35.6(L) 37.1(L)  Platelets 150 - 400 K/uL 160 170 202   CMP Latest Ref Rng & Units 02/06/2021 01/30/2021 01/23/2021  Glucose 70 - 99 mg/dL 125(H) 120(H) 96  BUN 8 - 23 mg/dL 31(H) 29(H) 30(H)  Creatinine 0.61 - 1.24 mg/dL 0.94 0.98 1.02  Sodium 135 - 145 mmol/L 135  135 137  Potassium 3.5 - 5.1 mmol/L 4.3 4.0 4.2  Chloride 98 - 111 mmol/L 99 101 104  CO2 22 - 32 mmol/L 29 28 29   Calcium 8.9 - 10.3 mg/dL 8.9 8.9 8.6(L)  Total Protein 6.5 - 8.1 g/dL 6.6 6.6 6.8  Total Bilirubin 0.3 - 1.2 mg/dL 0.6 0.6 0.6  Alkaline Phos 38 -  126 U/L 84 82 87  AST 15 - 41 U/L 21 21 22   ALT 0 - 44 U/L 24 22 25     DIAGNOSTIC IMAGING:  I have independently reviewed the scans and discussed with the patient. No results found.   ASSESSMENT:  1.  Left tonsillar squamous cell cancer: - Left tonsillar biopsy by Dr. Benjamine Mola on 11/15/2020 consistent with squamous cell carcinoma.  P16 is strongly positive. - PET scan on 12/07/2020-shows 4.1 cm left palatine tonsil mass with SUV 16.  0.8 cm left level 3 lymph node SUV 4.4.  Elongated 0.9 x 1 cm right lower lobe nodule along the azygoesophageal recess has accentuated metabolic activity with SUV 5.8.  This has some solid component and was not visible on chest CT of 04/24/2020.  Differential includes unusual morphology of metastatic disease versus inflammatory process.  Groundglass opacity laterally in the right upper lobe likely inflammatory with SUV 1.4.  0.6 cm right middle lobe nodule not hypermetabolic. - Concurrent chemoradiation therapy started on 01/09/2021. 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  Hearing on the left side is slightly worse due to tumor.  2.  Social/family history: - He has a retired after working for [employer].  He quit smoking 12 years ago.  He smoked 2 packs/day for 20 years. - Sister had lung cancer.  2 sisters died of lung cancer.  Brother had lung cancer.   PLAN:  1.  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  Compazine helps. - Physical examination today shows near complete resolution of the left tonsillar mass and lymph node in the left neck is no longer palpable. - Reviewed labs which showed a creatinine of 0.95 and LFTs are normal.  ALT lites were normal 2.  White count is 6.1 with normal ANC. - He will proceed with cisplatin today.  We will plan for his last treatment next week as he is finishing radiation on 02/19/2021.  If there is worsening of the numbness in  the left hip area, will consider cutting back on the dose of cisplatin next week.  2.  Left ear and headache: - Pain has improved.  He is no longer requiring hydrocodone.  3.  Nutrition: - He is taking in 5 to 6 cans of Osmolite per day via PEG tube. - He is drinking Carnation instant breakfast in the morning by mouth.  4.  Peripheral neuropathy: - He developed left hip tingling at bedtime and occasional burning in the same area for the last 1 to 2 weeks.  No tingling or numbness in the fingertips or toes. - We will start him on gabapentin 300 mg at bedtime.   Orders placed this encounter:  No orders of the defined types were placed in this encounter.    Derek Brayden, MD [place] [phone]   I, Thana Ates, am acting as a scribe for Dr. Derek Geremiah.  I, Derek Dayle MD, have reviewed the above documentation for accuracy and completeness, and I agree with the above.

## 2021-02-13 ENCOUNTER — Inpatient Hospital Stay (HOSPITAL_COMMUNITY): Payer: Medicare HMO

## 2021-02-13 ENCOUNTER — Inpatient Hospital Stay (HOSPITAL_COMMUNITY): Payer: Medicare HMO | Admitting: Hematology

## 2021-02-13 ENCOUNTER — Other Ambulatory Visit: Payer: Self-pay

## 2021-02-13 VITALS — BP 141/57 | HR 57 | Temp 96.9°F | Resp 17 | Wt 143.2 lb

## 2021-02-13 VITALS — BP 153/64 | HR 57 | Temp 97.5°F | Resp 18

## 2021-02-13 DIAGNOSIS — Z85528 Personal history of other malignant neoplasm of kidney: Secondary | ICD-10-CM

## 2021-02-13 DIAGNOSIS — C099 Malignant neoplasm of tonsil, unspecified: Secondary | ICD-10-CM | POA: Diagnosis not present

## 2021-02-13 DIAGNOSIS — R252 Cramp and spasm: Secondary | ICD-10-CM | POA: Insufficient documentation

## 2021-02-13 DIAGNOSIS — Z85828 Personal history of other malignant neoplasm of skin: Secondary | ICD-10-CM | POA: Diagnosis not present

## 2021-02-13 DIAGNOSIS — Z8249 Family history of ischemic heart disease and other diseases of the circulatory system: Secondary | ICD-10-CM | POA: Diagnosis not present

## 2021-02-13 DIAGNOSIS — R1312 Dysphagia, oropharyngeal phase: Secondary | ICD-10-CM | POA: Insufficient documentation

## 2021-02-13 DIAGNOSIS — R1310 Dysphagia, unspecified: Secondary | ICD-10-CM | POA: Diagnosis not present

## 2021-02-13 DIAGNOSIS — Z905 Acquired absence of kidney: Secondary | ICD-10-CM | POA: Diagnosis not present

## 2021-02-13 DIAGNOSIS — E785 Hyperlipidemia, unspecified: Secondary | ICD-10-CM | POA: Diagnosis not present

## 2021-02-13 DIAGNOSIS — I10 Essential (primary) hypertension: Secondary | ICD-10-CM | POA: Diagnosis not present

## 2021-02-13 DIAGNOSIS — C098 Malignant neoplasm of overlapping sites of tonsil: Secondary | ICD-10-CM | POA: Diagnosis not present

## 2021-02-13 DIAGNOSIS — C770 Secondary and unspecified malignant neoplasm of lymph nodes of head, face and neck: Secondary | ICD-10-CM | POA: Diagnosis not present

## 2021-02-13 DIAGNOSIS — Z87891 Personal history of nicotine dependence: Secondary | ICD-10-CM | POA: Diagnosis not present

## 2021-02-13 LAB — CBC WITH DIFFERENTIAL/PLATELET
Abs Immature Granulocytes: 0.02 10*3/uL (ref 0.00–0.07)
Basophils Absolute: 0 10*3/uL (ref 0.0–0.1)
Basophils Relative: 1 %
Eosinophils Absolute: 0.1 10*3/uL (ref 0.0–0.5)
Eosinophils Relative: 2 %
HCT: 34.3 % — ABNORMAL LOW (ref 39.0–52.0)
Hemoglobin: 11.6 g/dL — ABNORMAL LOW (ref 13.0–17.0)
Immature Granulocytes: 0 %
Lymphocytes Relative: 10 %
Lymphs Abs: 0.6 10*3/uL — ABNORMAL LOW (ref 0.7–4.0)
MCH: 33 pg (ref 26.0–34.0)
MCHC: 33.8 g/dL (ref 30.0–36.0)
MCV: 97.4 fL (ref 80.0–100.0)
Monocytes Absolute: 0.9 10*3/uL (ref 0.1–1.0)
Monocytes Relative: 15 %
Neutro Abs: 4.4 10*3/uL (ref 1.7–7.7)
Neutrophils Relative %: 72 %
Platelets: 160 10*3/uL (ref 150–400)
RBC: 3.52 MIL/uL — ABNORMAL LOW (ref 4.22–5.81)
RDW: 13.7 % (ref 11.5–15.5)
WBC: 6.1 10*3/uL (ref 4.0–10.5)
nRBC: 0 % (ref 0.0–0.2)

## 2021-02-13 LAB — COMPREHENSIVE METABOLIC PANEL
ALT: 19 U/L (ref 0–44)
AST: 19 U/L (ref 15–41)
Albumin: 3.8 g/dL (ref 3.5–5.0)
Alkaline Phosphatase: 87 U/L (ref 38–126)
Anion gap: 7 (ref 5–15)
BUN: 33 mg/dL — ABNORMAL HIGH (ref 8–23)
CO2: 29 mmol/L (ref 22–32)
Calcium: 8.7 mg/dL — ABNORMAL LOW (ref 8.9–10.3)
Chloride: 98 mmol/L (ref 98–111)
Creatinine, Ser: 0.95 mg/dL (ref 0.61–1.24)
GFR, Estimated: >60 mL/min (ref >60)
Glucose, Bld: 119 mg/dL — ABNORMAL HIGH (ref 70–99)
Potassium: 4.3 mmol/L (ref 3.5–5.1)
Sodium: 134 mmol/L — ABNORMAL LOW (ref 135–145)
Total Bilirubin: 0.7 mg/dL (ref 0.3–1.2)
Total Protein: 6.7 g/dL (ref 6.5–8.1)

## 2021-02-13 LAB — MAGNESIUM: Magnesium: 2.2 mg/dL (ref 1.7–2.4)

## 2021-02-13 MED ORDER — SODIUM CHLORIDE 0.9% FLUSH
10.0000 mL | INTRAVENOUS | Status: DC | PRN
Start: 2021-02-13 — End: 2021-02-13
  Administered 2021-02-13: 10 mL

## 2021-02-13 MED ORDER — SODIUM CHLORIDE 0.9 % IV SOLN: Freq: Once | INTRAVENOUS | Status: AC

## 2021-02-13 MED ORDER — MAGNESIUM SULFATE 2 GM/50ML IV SOLN
2.0000 g | Freq: Once | INTRAVENOUS | Status: AC
  Administered 2021-02-13: 2 g via INTRAVENOUS
  Filled 2021-02-13: qty 50

## 2021-02-13 MED ORDER — SODIUM CHLORIDE 0.9 % IV SOLN
150.0000 mg | Freq: Once | INTRAVENOUS | Status: AC
  Administered 2021-02-13: 150 mg via INTRAVENOUS
  Filled 2021-02-13: qty 150

## 2021-02-13 MED ORDER — SODIUM CHLORIDE 0.9 % IV SOLN
10.0000 mg | Freq: Once | INTRAVENOUS | Status: AC
  Administered 2021-02-13: 10 mg via INTRAVENOUS
  Filled 2021-02-13: qty 10

## 2021-02-13 MED ORDER — HEPARIN SOD (PORK) LOCK FLUSH 100 UNIT/ML IV SOLN
500.0000 [IU] | Freq: Once | INTRAVENOUS | Status: AC | PRN
  Administered 2021-02-13: 500 [IU]

## 2021-02-13 MED ORDER — GABAPENTIN 300 MG PO CAPS: 300.0000 mg | ORAL_CAPSULE | Freq: Every day | ORAL | 1 refills | Status: DC

## 2021-02-13 MED ORDER — POTASSIUM CHLORIDE IN NACL 20-0.9 MEQ/L-% IV SOLN
Freq: Once | INTRAVENOUS | Status: AC
  Filled 2021-02-13: qty 1000

## 2021-02-13 MED ORDER — PALONOSETRON HCL INJECTION 0.25 MG/5ML
0.2500 mg | Freq: Once | INTRAVENOUS | Status: AC
  Administered 2021-02-13: 0.25 mg via INTRAVENOUS
  Filled 2021-02-13: qty 5

## 2021-02-13 MED ORDER — SODIUM CHLORIDE 0.9 % IV SOLN
40.0000 mg/m2 | Freq: Once | INTRAVENOUS | Status: AC
  Administered 2021-02-13: 71 mg via INTRAVENOUS
  Filled 2021-02-13: qty 71

## 2021-02-13 NOTE — Progress Notes (Signed)
Patient okay for treatment pending labs, okay to start hydration fluids per Dr. Delton Coombes.Patient tolerated chemotherapy with no complaints voiced. Side effects with management reviewed understanding verbalized. Port site clean and dry with no bruising or swelling noted at site. Good blood return noted before and after administration of chemotherapy. Band aid applied. Patient left in satisfactory condition with VSS and no s/s of distress noted.

## 2021-02-13 NOTE — Patient Instructions (Signed)
[place] at [facility] Discharge Instructions   You were seen and examined by Dr. Delton Coombes today.     Thank you for choosing [place] at [facility] to provide your oncology and hematology care.  To afford each patient quality time with our provider, please arrive at least 15 minutes before your scheduled appointment time.   If you have a lab appointment with the [facility] please come in thru the Main Entrance and check in at the main information desk.  You need to re-schedule your appointment should you arrive 10 or more minutes late.  We strive to give you quality time with our providers, and arriving late affects you and other patients whose appointments are after yours.  Also, if you no show three or more times for appointments you may be dismissed from the clinic at the providers discretion.     Again, thank you for choosing [facility].  Our hope is that these requests will decrease the amount of time that you wait before being seen by our physicians.       _____________________________________________________________  Should you have questions after your visit to [facility], please contact our office at [phone] and follow the prompts.  Our office hours are 8:00 a.m. and 4:30 p.m. Monday - Friday.  Please note that voicemails left after 4:00 p.m. may not be returned until the following business day.  We are closed weekends and major holidays.  You do have access to a nurse 24-7, just call the main number to the clinic [phone] and do not press any options, hold on the line and a nurse will answer the phone.    For prescription refill requests, have your pharmacy contact our office and allow 72 hours.    Due to Covid, you will need to wear a mask upon entering the hospital. If you do not have a mask, a mask will be given to you at the Main Entrance upon arrival. For doctor visits, patients may have  1 support person age 21 or older with them. For treatment visits, patients can not have anyone with them due to social distancing guidelines and our immunocompromised population.

## 2021-02-13 NOTE — Progress Notes (Signed)
Patients port flushed without difficulty.  Good blood return noted with no bruising or swelling noted at site.  Stable during access and blood draw.  Patient to remain accessed for treatment. 

## 2021-02-13 NOTE — Patient Instructions (Signed)
Douglas [name]  Discharge Instructions: Thank you for choosing [facility] to provide your oncology and hematology care.  If you have a lab appointment with the [facility], please come in thru the Main Entrance and check in at the main information desk.  Wear comfortable clothing and clothing appropriate for easy access to any Portacath or PICC line.   We strive to give you quality time with your provider. You may need to reschedule your appointment if you arrive late (15 or more minutes).  Arriving late affects you and other patients whose appointments are after yours.  Also, if you miss three or more appointments without notifying the office, you may be dismissed from the clinic at the provider's discretion.      For prescription refill requests, have your pharmacy contact our office and allow 72 hours for refills to be completed.    Today you received the following chemotherapy and/or immunotherapy agents Cisplatin. Return as scheduled.   To help prevent nausea and vomiting after your treatment, we encourage you to take your nausea medication as directed.  BELOW ARE SYMPTOMS THAT SHOULD BE REPORTED IMMEDIATELY: *FEVER GREATER THAN 100.4 F (38 C) OR HIGHER *CHILLS OR SWEATING *NAUSEA AND VOMITING THAT IS NOT CONTROLLED WITH YOUR NAUSEA MEDICATION *UNUSUAL SHORTNESS OF BREATH *UNUSUAL BRUISING OR BLEEDING *URINARY PROBLEMS (pain or burning when urinating, or frequent urination) *BOWEL PROBLEMS (unusual diarrhea, constipation, pain near the anus) TENDERNESS IN MOUTH AND THROAT WITH OR WITHOUT PRESENCE OF ULCERS (sore throat, sores in mouth, or a toothache) UNUSUAL RASH, SWELLING OR PAIN  UNUSUAL VAGINAL DISCHARGE OR ITCHING   Items with * indicate a potential emergency and should be followed up as soon as possible or go to the Emergency Department if any problems should occur.  Please show the CHEMOTHERAPY ALERT CARD or IMMUNOTHERAPY ALERT CARD at check-in to  the Emergency Department and triage nurse.  Should you have questions after your visit or need to cancel or reschedule your appointment, please contact [facility] [phone]  and follow the prompts.  Office hours are 8:00 a.m. to 4:30 p.m. Monday - Friday. Please note that voicemails left after 4:00 p.m. may not be returned until the following business day.  We are closed weekends and major holidays. You have access to a nurse at all times for urgent questions. Please call the main number to the clinic [phone] and follow the prompts.  For any non-urgent questions, you may also contact your provider using MyChart. We now offer e-Visits for anyone 5 and older to request care online for non-urgent symptoms. For details visit [email or web address].   Also download the MyChart app! Go to the app store, search "MyChart", open the app, select [facility], and log in with your MyChart username and password.  Due to Covid, a mask is required upon entering the hospital/clinic. If you do not have a mask, one will be given to you upon arrival. For doctor visits, patients may have 1 support person aged 15 or older with them. For treatment visits, patients cannot have anyone with them due to current Covid guidelines and our immunocompromised population.

## 2021-02-14 ENCOUNTER — Ambulatory Visit (HOSPITAL_COMMUNITY): Payer: Medicare HMO | Attending: Hematology and Oncology | Admitting: Speech Pathology

## 2021-02-14 ENCOUNTER — Encounter (HOSPITAL_COMMUNITY): Payer: Self-pay | Admitting: Speech Pathology

## 2021-02-14 DIAGNOSIS — C098 Malignant neoplasm of overlapping sites of tonsil: Secondary | ICD-10-CM | POA: Diagnosis not present

## 2021-02-14 DIAGNOSIS — C099 Malignant neoplasm of tonsil, unspecified: Secondary | ICD-10-CM | POA: Diagnosis not present

## 2021-02-14 DIAGNOSIS — Z905 Acquired absence of kidney: Secondary | ICD-10-CM | POA: Diagnosis not present

## 2021-02-14 DIAGNOSIS — Z8249 Family history of ischemic heart disease and other diseases of the circulatory system: Secondary | ICD-10-CM | POA: Diagnosis not present

## 2021-02-14 DIAGNOSIS — E785 Hyperlipidemia, unspecified: Secondary | ICD-10-CM | POA: Diagnosis not present

## 2021-02-14 DIAGNOSIS — R252 Cramp and spasm: Secondary | ICD-10-CM

## 2021-02-14 DIAGNOSIS — Z87891 Personal history of nicotine dependence: Secondary | ICD-10-CM | POA: Diagnosis not present

## 2021-02-14 DIAGNOSIS — R1312 Dysphagia, oropharyngeal phase: Secondary | ICD-10-CM | POA: Diagnosis not present

## 2021-02-14 DIAGNOSIS — I10 Essential (primary) hypertension: Secondary | ICD-10-CM | POA: Diagnosis not present

## 2021-02-14 DIAGNOSIS — R1310 Dysphagia, unspecified: Secondary | ICD-10-CM

## 2021-02-14 DIAGNOSIS — C770 Secondary and unspecified malignant neoplasm of lymph nodes of head, face and neck: Secondary | ICD-10-CM | POA: Diagnosis not present

## 2021-02-14 DIAGNOSIS — Z85528 Personal history of other malignant neoplasm of kidney: Secondary | ICD-10-CM | POA: Diagnosis not present

## 2021-02-14 NOTE — Therapy (Signed)
[place], [place], [address] Phone: [phone]   Fax:  [phone]  Speech Language Pathology Treatment  Patient Details  Name: Douglas [name] MRN: [id number] Date of Birth: 07-26-1945 No data recorded  Encounter Date: 02/14/2021   End of Session - 02/14/21 1212     Visit Number 2    Number of Visits 4    Authorization Type [employer] Medicare   [place] approved ST 9/22-10/28/22 NOBS#[id number]  hum[employer] mcr hmo eff 01.01.2022  $20.00 co-pay  no ded  oop max $3,900.00/ $280.09 met   SLP Start Time 1030    SLP Stop Time  1130    SLP Time Calculation (min) 60 min    Activity Tolerance Patient tolerated treatment well             Past Medical History:  Diagnosis Date   Arthritis    Cancer ([place])    Skin, and Kidney   Hypertension    Pre-diabetes    Skin cancer     Past Surgical History:  Procedure Laterality Date   COLONOSCOPY     HERNIA REPAIR     IR GASTROSTOMY TUBE MOD SED  12/11/2020   IR IMAGING GUIDED PORT INSERTION  12/11/2020   LAPAROSCOPIC PARTIAL NEPHRECTOMY Left    PARTIAL NEPHRECTOMY Left 2009   REVERSE SHOULDER ARTHROPLASTY Right 09/10/2019   Procedure: REVERSE SHOULDER ARTHROPLASTY;  Surgeon: [name], MD;  Location: [facility];  Service: Orthopedics;  Laterality: Right;  interscalene block   SKIN SURGERY      There were no vitals filed for this visit.   Subjective Assessment - 02/14/21 1200     Subjective "My swallowing is getting worse."    Currently in Pain? No/denies               ADULT SLP TREATMENT - 02/14/21 1201       General Information   Behavior/Cognition Alert;Cooperative;Pleasant mood    Patient Positioning Upright in chair    Oral care provided N/A    HPI Douglas [name] is a 75 year old male who was referred by Dr. 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 Pt receiving concurrent chemotherapy and radiation therapy. He had a PEG placed on 12/11/2020. Chemo and radiation should be completed in mid October. He reports some difficulty swallowing solid foods.      Treatment Provided   Treatment provided Dysphagia   trismus     Dysphagia Treatment   Temperature Spikes Noted No    Respiratory Status Room air    Oral Cavity - Dentition Adequate natural dentition   missing some   Treatment Methods Skilled observation;Compensation strategy training;Patient/caregiver education;Therapeutic exercise    Patient observed directly with PO's Yes    Type of PO's observed Thin liquids    Feeding Able to feed self    Liquids provided via Cup    Pharyngeal Phase Signs & Symptoms Delayed throat clear;Immediate throat clear    Type of cueing Verbal    Amount of cueing Minimal    Other treatment/comments trialed chin tuck      Pain Assessment   Pain Assessment No/denies pain      Assessment / Recommendations / Plan   Plan Continue with current plan of care      Dysphagia Recommendations   Diet recommendations Regular;Thin liquid  self regulated regular textures   Liquids provided via Cup    Medication Administration Whole meds with liquid    Supervision Patient able to self feed    Compensations Small sips/bites   trial chin tuck appeared to help   Postural Changes and/or Swallow Maneuvers Chin tuck   with thin liquids, f/u with MBSS next Tuesday     General Recommendations   Oral Care Recommendations Oral care BID      Progression Toward Goals   Progression toward goals Progressing toward goals              SLP Education - 02/14/21 1210     Education Details Provided trismus device, intro passive use for now, plan for MBSS next Tuesday    Person(s) Educated Patient    Methods Explanation    Comprehension Verbalized understanding              SLP Short Term Goals - 02/14/21 1228       SLP SHORT TERM GOAL  #1   Title Pt will demonstrate safe and efficient consumption of self regulated regular textures with thin liquids with use of strategies as needed.    Baseline semi soft and thin    Time 4    Period Weeks    Status On-going    Target Date 03/08/21      SLP SHORT TERM GOAL #2   Title Pt will complete pharyngeal swallowing exercises as assigned with use of written cue after initial introduction/model from SLP    Baseline limited exercises introduced this date    Time 4    Period Weeks    Status On-going    Target Date 03/08/21      SLP SHORT TERM GOAL #3   Title Pt will verbalize 3 signs/symptoms of aspiration pneumonia with min assist.    Baseline introduced    Time 4    Period Weeks    Status On-going    Target Date 03/08/21      SLP SHORT TERM GOAL #4   Title Pt will use trismus splint as assigned daily via self report and use of written cues.    Baseline needs splint    Time 4    Period Weeks    Status On-going    Target Date 03/08/21                Plan - 02/14/21 1213     Clinical Impression Statement Pt is scheduled for MBSS next Tuesday. He endorses increased trouble swallowing liquids and saliva. In session, oral bolus hold was trialed, in addition to chin tuck. Pt with subjective report of improvement with chin tuck, also no coughing or throat clearing. Trismus device was created in session today. Pt with maximal jaw opening (MJO) of 96m today (358ma few weeks ago) and instructed to passively use the device for now to limit pain. Pt to continue with oropharyngeal swalling exercised as previously assigned and complete MBSS on Tuesday.    Speech Therapy Frequency --   3 visits over the next month   Treatment/Interventions Aspiration precaution training;Patient/family education;SLP instruction and feedback    Potential to Achieve Goals Good    SLP Home Exercise Plan Pt will completed HEP as assigned to facilitate carryover of treatment strategies and techniques  in home environment with use of written cues as needed.    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 02/14/2021, 12:36 PM  [place], [place], [address] Phone: [phone]   Fax:  [phone]   Name: Douglas [name] MRN: [id number] Date of Birth: Mar 13, 1946

## 2021-02-15 ENCOUNTER — Inpatient Hospital Stay: Payer: Medicare HMO | Attending: Genetic Counselor | Admitting: Dietician

## 2021-02-15 ENCOUNTER — Telehealth: Payer: Self-pay | Admitting: Dietician

## 2021-02-15 DIAGNOSIS — Z87891 Personal history of nicotine dependence: Secondary | ICD-10-CM | POA: Diagnosis not present

## 2021-02-15 DIAGNOSIS — I10 Essential (primary) hypertension: Secondary | ICD-10-CM | POA: Diagnosis not present

## 2021-02-15 DIAGNOSIS — Z8249 Family history of ischemic heart disease and other diseases of the circulatory system: Secondary | ICD-10-CM | POA: Diagnosis not present

## 2021-02-15 DIAGNOSIS — Z85528 Personal history of other malignant neoplasm of kidney: Secondary | ICD-10-CM | POA: Diagnosis not present

## 2021-02-15 DIAGNOSIS — E785 Hyperlipidemia, unspecified: Secondary | ICD-10-CM | POA: Diagnosis not present

## 2021-02-15 DIAGNOSIS — C770 Secondary and unspecified malignant neoplasm of lymph nodes of head, face and neck: Secondary | ICD-10-CM | POA: Diagnosis not present

## 2021-02-15 DIAGNOSIS — C099 Malignant neoplasm of tonsil, unspecified: Secondary | ICD-10-CM | POA: Diagnosis not present

## 2021-02-15 DIAGNOSIS — C098 Malignant neoplasm of overlapping sites of tonsil: Secondary | ICD-10-CM | POA: Diagnosis not present

## 2021-02-15 DIAGNOSIS — Z905 Acquired absence of kidney: Secondary | ICD-10-CM | POA: Diagnosis not present

## 2021-02-15 NOTE — Telephone Encounter (Signed)
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   Spoke with patient via telephone. He reports he has a two hour window after radiation before he gets tired. He reports running errands and doing a bunch of stuff this morning before feeling run down (visited friends at work, made wheat, sourdough bread) Patient reports tolerating tube feedings well, he is giving 5-6 cartons/day. Drinking 1-2 CIB by mouth. Patient reports swallowing breads/meats is becoming more challenging and intermittent hoarseness. Patient continues to cook, says food looks great and smells delicious but unable to taste anything. Patient reports feeling frustration after trying lasagna a few nights ago. He is using baking soda salt water rinses for dry mouth. Patient reports occasional nausea relieved by zofran, says zofran constipates him. Patient taking milk of magnesia as needed.     Medications: reviewed  Labs: 10/11 Glucose 119, Na 134, BUN 33  Anthropometrics: Weight 143 lb 3.2 oz on 10/11 stable   10/4 - 144 lb 1 oz 9/27 - 142 lb 12.8 oz  9/20 - 145 lb 15.1 oz 9/13 - 144 lb 13.5 oz    NUTRITION DIAGNOSIS: Inadequate oral intake ongoing, pt relying on tube feedings   INTERVENTION:  Continue 5-6 cartons Osmolite 1.5 via tube daily Continue drinking 1-2 CIB with whole milk as tolerated for added calories and protein Encouraged oral intake of soft, moist high protein foods as tolerated Continue baking soda salt water rinses several times daily Patient has contact information    MONITORING, EVALUATION, GOAL: weight trends, intake   NEXT VISIT: Monday October 17 during infusion

## 2021-02-16 DIAGNOSIS — Z905 Acquired absence of kidney: Secondary | ICD-10-CM | POA: Diagnosis not present

## 2021-02-16 DIAGNOSIS — I10 Essential (primary) hypertension: Secondary | ICD-10-CM | POA: Diagnosis not present

## 2021-02-16 DIAGNOSIS — C098 Malignant neoplasm of overlapping sites of tonsil: Secondary | ICD-10-CM | POA: Diagnosis not present

## 2021-02-16 DIAGNOSIS — Z85528 Personal history of other malignant neoplasm of kidney: Secondary | ICD-10-CM | POA: Diagnosis not present

## 2021-02-16 DIAGNOSIS — C770 Secondary and unspecified malignant neoplasm of lymph nodes of head, face and neck: Secondary | ICD-10-CM | POA: Diagnosis not present

## 2021-02-16 DIAGNOSIS — Z8249 Family history of ischemic heart disease and other diseases of the circulatory system: Secondary | ICD-10-CM | POA: Diagnosis not present

## 2021-02-16 DIAGNOSIS — C099 Malignant neoplasm of tonsil, unspecified: Secondary | ICD-10-CM | POA: Diagnosis not present

## 2021-02-16 DIAGNOSIS — Z87891 Personal history of nicotine dependence: Secondary | ICD-10-CM | POA: Diagnosis not present

## 2021-02-16 DIAGNOSIS — E785 Hyperlipidemia, unspecified: Secondary | ICD-10-CM | POA: Diagnosis not present

## 2021-02-17 NOTE — Progress Notes (Signed)
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 There is preferential submucosal growth and the mass is indistinguishable from the left prevertebral space and lower left stylopharyngeaus. No convincing adenopathy. 2. Mixed density right upper lobe nodule, attention on anticipated PET.   11/29/2020 Initial Diagnosis   Tonsil cancer ([place])   11/29/2020 Cancer Staging   Staging form: Pharynx - HPV-Mediated Oropharynx, AJCC 8th Edition - Clinical stage from 11/29/2020: cT4, p16+ - Signed by [name], MD on 11/29/2020 Stage prefix: Initial diagnosis   01/09/2021 -  Chemotherapy   Patient is on Treatment Plan : HEAD/NECK Cisplatin q7d       CANCER STAGING: Cancer Staging Tonsil cancer [facility]) Staging form: Pharynx - HPV-Mediated Oropharynx, AJCC 8th Edition - Clinical stage from 11/29/2020: cT4, p16+ - Signed by [name], MD on 11/29/2020   INTERVAL HISTORY:  Mr. Douglas [name], a 75 y.o. male, returns for routine follow-up of his left tonsil cancer. Douglas [name] was last seen on 02/13/2021.   Today he reports feeling okay. He reports throat pain. He reports using 6 cans of tube feed along with 1 carnation instant breakfast daily. He reports his left hip tingling has  resolved with Gabapentin, and he denies tingling/numbness elsewhere in the body. He reports a headaches and bilateral ear pain starting yesterday. He denies fatigue.   REVIEW OF SYSTEMS:  Review of Systems  Constitutional:  Positive for appetite change (depleted). Negative for fatigue (75%).  HENT:   Positive for sore throat (3/).   Gastrointestinal:  Positive for nausea.  Neurological:  Positive for dizziness and headaches (/). Negative for numbness.  All other systems reviewed and are negative.  PAST MEDICAL/SURGICAL HISTORY:  Past Medical History:  Diagnosis Date   Arthritis    Cancer ([place])    Skin, and Kidney   Hypertension    Pre-diabetes    Skin cancer    Past Surgical History:  Procedure Laterality Date   COLONOSCOPY     HERNIA REPAIR     IR GASTROSTOMY TUBE MOD SED  12/11/2020   IR IMAGING GUIDED PORT INSERTION  12/11/2020   LAPAROSCOPIC PARTIAL NEPHRECTOMY Left    PARTIAL NEPHRECTOMY Left 2009   REVERSE SHOULDER ARTHROPLASTY Right 09/10/2019   Procedure: REVERSE SHOULDER ARTHROPLASTY;  Surgeon: [name], MD;  Location: [facility];  Service: Orthopedics;  Laterality: Right;  interscalene block   SKIN SURGERY      SOCIAL HISTORY:  Social History   Socioeconomic History   Marital status: Widowed    Spouse name: Not on file   Number of children: Not on file   Years of education: Not on file   Highest education level: Not on file  Occupational History    Employer: [employer]  Tobacco Use  Smoking status: Former    Packs/day: 1.00    Years: 20.00    Pack years: 20.00    Types: Cigarettes    Quit date: 2010    Years since quitting: 12.8   Smokeless tobacco: Never  Vaping Use   Vaping Use: Never used  Substance and Sexual Activity   Alcohol use: Yes    Alcohol/week: 1.0 standard drink    Types: 1 Cans of beer per week    Comment: occ   Drug use: Never   Sexual activity: Not on file  Other Topics Concern   Not on file  Social History Narrative   Not on  file   Social Determinants of Health   Financial Resource Strain: Low Risk    Difficulty of Paying Living Expenses: Not hard at all  Food Insecurity: No Food Insecurity   Worried About Charity fundraiser in the Last Year: Never true   [place] in the Last Year: Never true  Transportation Needs: No Transportation Needs   Lack of Transportation (Medical): No   Lack of Transportation (Non-Medical): No  Physical Activity: Sufficiently Active   Days of Exercise per Week: 7 days   Minutes of Exercise per Session: 30 min  Stress: No Stress Concern Present   Feeling of Stress : Not at all  Social Connections: Socially Isolated   Frequency of Communication with Friends and Family: More than three times a week   Frequency of Social Gatherings with Friends and Family: More than three times a week   Attends Religious Services: Never   Marine scientist or Organizations: No   Attends Archivist Meetings: Never   Marital Status: Widowed  Human resources officer Violence: Not At Risk   Fear of Current or Ex-Partner: No   Emotionally Abused: No   Physically Abused: No   Sexually Abused: No    FAMILY HISTORY:  Family History  Problem Relation Age of Onset   Diabetes Mother    Pulmonary embolism Father    Lung cancer Sister    Lung cancer Sister    Diabetes Brother    Lung cancer Brother     CURRENT MEDICATIONS:  Current Outpatient Medications  Medication Sig Dispense Refill   acetaminophen-codeine (TYLENOL #3) 300-30 MG tablet      amLODipine (NORVASC) 10 MG tablet Take 10 mg by mouth daily.     atenolol (TENORMIN) 25 MG tablet Take 25 mg by mouth daily.     cholecalciferol (VITAMIN D3) 25 MCG (1000 UNIT) tablet Take 1,000 Units by mouth daily.     CISPLATIN IV Inject 40 mg/m2 into the vein once a week.     diclofenac (VOLTAREN) 75 MG EC tablet Take 75 mg by mouth 2 (two) times daily.     gabapentin (NEURONTIN) 300 MG capsule Take 1 capsule (300 mg total) by mouth at  bedtime. 30 capsule 1   lidocaine-prilocaine (EMLA) cream Apply to affected area once 30 g 3   lisinopril (ZESTRIL) 40 MG tablet Take 40 mg by mouth daily.     Menaquinone-7 (VITAMIN K2 PO) Take 1 tablet by mouth daily.     Multiple Vitamins-Minerals (MULTIVITAMIN WITH MINERALS) tablet Take 1 tablet by mouth daily.     Nutritional Supplements (FEEDING SUPPLEMENT, OSMOLITE 1.5 CAL,) LIQD Give 6 cartons Osmolite 1.5 via tube split over 4 feedings/day. Flush tube with 60 ml water before and after each bolus feeding. Drink by mouth or give via tube additional 2.5 c  fluids/day. This provides 2130 kcal, 89 grams protein, 2159 ml total water. Meets 100% needs.  0   ondansetron (ZOFRAN) 8 MG tablet Take 1 tablet (8 mg total) by mouth 2 (two) times daily as needed. Start on the third day after cisplatin chemotherapy. 30 tablet 1   prochlorperazine (COMPAZINE) 10 MG tablet Take 1 tablet (10 mg total) by mouth every 6 (six) hours as needed (Nausea or vomiting). 30 tablet 1   simvastatin (ZOCOR) 20 MG tablet Take 20 mg by mouth daily.     tamsulosin (FLOMAX) 0.4 MG CAPS capsule Take 0.8 mg by mouth daily.     zinc gluconate 50 MG tablet Take 50 mg by mouth daily.     HYDROcodone-acetaminophen (NORCO) 5-325 MG tablet Take 1 tablet by mouth every 8 (eight) hours as needed for moderate pain. (Patient not taking: Reported on 02/19/2021) 30 tablet 0   magic mouthwash w/lidocaine SOLN Take 5 mLs by mouth 4 (four) times daily as needed for mouth pain. (Patient not taking: Reported on 02/19/2021) 250 mL 3   No current facility-administered medications for this visit.    ALLERGIES:  No Known Allergies  PHYSICAL EXAM:  Performance status (ECOG): 0 - Asymptomatic  Vitals:   02/19/21 1214  BP: (!) 141/65  Pulse: 71  Resp: 18  Temp: (!) 96.9 F (36.1 C)  SpO2: 99%   Wt Readings from Last 3 Encounters:  02/19/21 142 lb 9.6 oz (64.7 kg)  02/13/21 143 lb 3.2 oz (65 kg)  02/06/21 144 lb 1 oz (65.3 kg)    Physical Exam Vitals reviewed.  Constitutional:      Appearance: Normal appearance.  Cardiovascular:     Rate and Rhythm: Normal rate and regular rhythm.     Pulses: Normal pulses.     Heart sounds: Normal heart sounds.  Pulmonary:     Effort: Pulmonary effort is normal.     Breath sounds: Normal breath sounds.  Lymphadenopathy:     Cervical:     Right cervical: No superficial, deep or posterior cervical adenopathy.    Left cervical: No superficial, deep or posterior cervical adenopathy.  Neurological:     General: No focal deficit present.     Mental Status: He is alert and oriented to person, place, and time.  Psychiatric:        Mood and Affect: Mood normal.        Behavior: Behavior normal.     LABORATORY DATA:  I have reviewed the labs as listed.  CBC Latest Ref Rng & Units 02/19/2021 02/13/2021 02/06/2021  WBC 4.0 - 10.5 K/uL 5.9 6.1 6.5  Hemoglobin 13.0 - 17.0 g/dL 11.8(L) 11.6(L) 12.0(L)  Hematocrit 39.0 - 52.0 % 35.1(L) 34.3(L) 35.6(L)  Platelets 150 - 400 K/uL 193 160 170   CMP Latest Ref Rng & Units 02/13/2021 02/06/2021 01/30/2021  Glucose 70 - 99 mg/dL 119(H) 125(H) 120(H)  BUN 8 - 23 mg/dL 33(H) 31(H) 29(H)  Creatinine 0.61 - 1.24 mg/dL 0.95 0.94 0.98  Sodium 135 - 145 mmol/L 134(L) 135 135  Potassium 3.5 - 5.1 mmol/L 4.3 4.3 4.0  Chloride 98 - 111 mmol/L 98 99 101  CO2 22 - 32 mmol/L 29 29 28   Calcium 8.9 - 10.3 mg/dL 8.7(L) 8.9 8.9  Total Protein 6.5 - 8.1 g/dL 6.7 6.6 6.6  Total Bilirubin 0.3 - 1.2 mg/dL 0.7 0.6 0.6  Alkaline Phos 38 - 126 U/L 87 84 82  AST 15 - 41 U/L 19 21 21   ALT  0 - 44 U/L 19 24 22     DIAGNOSTIC IMAGING:  I have independently reviewed the scans and discussed with the patient. No results found.   ASSESSMENT:  1.  Left tonsillar squamous cell cancer: - Left tonsillar biopsy by Dr. Benjamine Mola on 11/15/2020 consistent with squamous cell carcinoma.  P16 is strongly positive. - PET scan on 12/07/2020-shows 4.1 cm left palatine tonsil mass  with SUV 16.  0.8 cm left level 3 lymph node SUV 4.4.  Elongated 0.9 x 1 cm right lower lobe nodule along the azygoesophageal recess has accentuated metabolic activity with SUV 5.8.  This has some solid component and was not visible on chest CT of 04/24/2020.  Differential includes unusual morphology of metastatic disease versus inflammatory process.  Groundglass opacity laterally in the right upper lobe likely inflammatory with SUV 1.4.  0.6 cm right middle lobe nodule not hypermetabolic. - Concurrent chemoradiation therapy started on 01/09/2021. 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  Hearing on the left side is slightly worse due to tumor.  2.  Social/family history: - He has a retired after working for [employer].  He quit smoking 12 years ago.  He smoked 2 packs/day for 20 years. - Sister had lung cancer.  2 sisters died of lung cancer.  Brother had lung cancer.   PLAN:  1.  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  He had occasional nausea which was well controlled. - We reviewed his labs.  Creatinine is 0.86 and rest of the electrolytes were normal.  CBC shows normal white count and platelet count with mild degree of anemia. - No ringing in the ears.  He arrived late today as his car broke down. - He will finish his radiation on 02/26/2021. - He will proceed with the cisplatin dose, last treatment tomorrow with pre and post hydration.  He will continue water intake via PEG tube. - We will reevaluate him in 3 weeks for follow-up with repeat labs.  2.  Left ear and headache: - Pain has improved.  He is no longer requiring hydrocodone.  3.  Nutrition: - Continue 6 cans of Osmolite per day via PEG tube. - Continue Carnation instant breakfast by mouth in the mornings.  4.  Peripheral neuropathy: - He developed left hip but tingling at bedtime and  occasional burning. - Continue gabapentin 300 mg at bedtime which is helping.   Orders placed this encounter:  No orders of the defined types were placed in this encounter.    Derek Taha, MD [place] [phone]   I, Thana Ates, am acting as a scribe for Dr. Derek Valmore.  I, Derek Raiford MD, have reviewed the above documentation for accuracy and completeness, and I agree with the above.

## 2021-02-19 ENCOUNTER — Inpatient Hospital Stay (HOSPITAL_COMMUNITY): Payer: Medicare HMO | Admitting: Hematology

## 2021-02-19 ENCOUNTER — Inpatient Hospital Stay (HOSPITAL_COMMUNITY): Payer: Medicare HMO | Admitting: Dietician

## 2021-02-19 ENCOUNTER — Other Ambulatory Visit: Payer: Self-pay

## 2021-02-19 ENCOUNTER — Inpatient Hospital Stay (HOSPITAL_COMMUNITY): Payer: Medicare HMO

## 2021-02-19 ENCOUNTER — Ambulatory Visit (HOSPITAL_COMMUNITY): Payer: Medicare HMO

## 2021-02-19 VITALS — BP 141/65 | HR 71 | Temp 96.9°F | Resp 18 | Wt 142.6 lb

## 2021-02-19 DIAGNOSIS — Z85828 Personal history of other malignant neoplasm of skin: Secondary | ICD-10-CM | POA: Diagnosis not present

## 2021-02-19 DIAGNOSIS — Z8249 Family history of ischemic heart disease and other diseases of the circulatory system: Secondary | ICD-10-CM | POA: Diagnosis not present

## 2021-02-19 DIAGNOSIS — Z85528 Personal history of other malignant neoplasm of kidney: Secondary | ICD-10-CM

## 2021-02-19 DIAGNOSIS — C098 Malignant neoplasm of overlapping sites of tonsil: Secondary | ICD-10-CM | POA: Diagnosis not present

## 2021-02-19 DIAGNOSIS — R1312 Dysphagia, oropharyngeal phase: Secondary | ICD-10-CM | POA: Diagnosis not present

## 2021-02-19 DIAGNOSIS — R1310 Dysphagia, unspecified: Secondary | ICD-10-CM | POA: Diagnosis not present

## 2021-02-19 DIAGNOSIS — C099 Malignant neoplasm of tonsil, unspecified: Secondary | ICD-10-CM

## 2021-02-19 DIAGNOSIS — R252 Cramp and spasm: Secondary | ICD-10-CM | POA: Diagnosis not present

## 2021-02-19 DIAGNOSIS — Z87891 Personal history of nicotine dependence: Secondary | ICD-10-CM | POA: Diagnosis not present

## 2021-02-19 DIAGNOSIS — E785 Hyperlipidemia, unspecified: Secondary | ICD-10-CM | POA: Diagnosis not present

## 2021-02-19 DIAGNOSIS — I10 Essential (primary) hypertension: Secondary | ICD-10-CM | POA: Diagnosis not present

## 2021-02-19 DIAGNOSIS — Z905 Acquired absence of kidney: Secondary | ICD-10-CM | POA: Diagnosis not present

## 2021-02-19 DIAGNOSIS — C770 Secondary and unspecified malignant neoplasm of lymph nodes of head, face and neck: Secondary | ICD-10-CM | POA: Diagnosis not present

## 2021-02-19 LAB — CBC WITH DIFFERENTIAL/PLATELET
Abs Immature Granulocytes: 0.03 10*3/uL (ref 0.00–0.07)
Basophils Absolute: 0 10*3/uL (ref 0.0–0.1)
Basophils Relative: 0 %
Eosinophils Absolute: 0.1 10*3/uL (ref 0.0–0.5)
Eosinophils Relative: 1 %
HCT: 35.1 % — ABNORMAL LOW (ref 39.0–52.0)
Hemoglobin: 11.8 g/dL — ABNORMAL LOW (ref 13.0–17.0)
Immature Granulocytes: 1 %
Lymphocytes Relative: 9 %
Lymphs Abs: 0.5 10*3/uL — ABNORMAL LOW (ref 0.7–4.0)
MCH: 33 pg (ref 26.0–34.0)
MCHC: 33.6 g/dL (ref 30.0–36.0)
MCV: 98 fL (ref 80.0–100.0)
Monocytes Absolute: 0.7 10*3/uL (ref 0.1–1.0)
Monocytes Relative: 12 %
Neutro Abs: 4.5 10*3/uL (ref 1.7–7.7)
Neutrophils Relative %: 77 %
Platelets: 193 10*3/uL (ref 150–400)
RBC: 3.58 MIL/uL — ABNORMAL LOW (ref 4.22–5.81)
RDW: 14.4 % (ref 11.5–15.5)
WBC: 5.9 10*3/uL (ref 4.0–10.5)
nRBC: 0 % (ref 0.0–0.2)

## 2021-02-19 LAB — COMPREHENSIVE METABOLIC PANEL
ALT: 18 U/L (ref 0–44)
AST: 18 U/L (ref 15–41)
Albumin: 4 g/dL (ref 3.5–5.0)
Alkaline Phosphatase: 94 U/L (ref 38–126)
Anion gap: 7 (ref 5–15)
BUN: 27 mg/dL — ABNORMAL HIGH (ref 8–23)
CO2: 27 mmol/L (ref 22–32)
Calcium: 9 mg/dL (ref 8.9–10.3)
Chloride: 99 mmol/L (ref 98–111)
Creatinine, Ser: 0.86 mg/dL (ref 0.61–1.24)
GFR, Estimated: >60 mL/min (ref >60)
Glucose, Bld: 94 mg/dL (ref 70–99)
Potassium: 4.6 mmol/L (ref 3.5–5.1)
Sodium: 133 mmol/L — ABNORMAL LOW (ref 135–145)
Total Bilirubin: 0.5 mg/dL (ref 0.3–1.2)
Total Protein: 7 g/dL (ref 6.5–8.1)

## 2021-02-19 LAB — MAGNESIUM: Magnesium: 2.2 mg/dL (ref 1.7–2.4)

## 2021-02-19 MED ORDER — SODIUM CHLORIDE 0.9% FLUSH
10.0000 mL | Freq: Once | INTRAVENOUS | Status: AC
  Administered 2021-02-19: 10 mL via INTRAVENOUS

## 2021-02-19 MED ORDER — HEPARIN SOD (PORK) LOCK FLUSH 100 UNIT/ML IV SOLN
500.0000 [IU] | Freq: Once | INTRAVENOUS | Status: AC
  Administered 2021-02-19: 500 [IU] via INTRAVENOUS

## 2021-02-19 NOTE — Patient Instructions (Signed)
[place] at [facility] Discharge Instructions  You were seen and examined today by Dr. Delton Coombes. You will proceed with your last cycle of cisplatin tomorrow. Return as scheduled for lab work and office visit.    Thank you for choosing [name] at [facility] to provide your oncology and hematology care.  To afford each patient quality time with our provider, please arrive at least 15 minutes before your scheduled appointment time.   If you have a lab appointment with the [facility] please come in thru the Main Entrance and check in at the main information desk.  You need to re-schedule your appointment should you arrive 10 or more minutes late.  We strive to give you quality time with our providers, and arriving late affects you and other patients whose appointments are after yours.  Also, if you no show three or more times for appointments you may be dismissed from the clinic at the providers discretion.     Again, thank you for choosing [facility].  Our hope is that these requests will decrease the amount of time that you wait before being seen by our physicians.       _____________________________________________________________  Should you have questions after your visit to [facility], please contact our office at [phone] and follow the prompts.  Our office hours are 8:00 a.m. and 4:30 p.m. Monday - Friday.  Please note that voicemails left after 4:00 p.m. may not be returned until the following business day.  We are closed weekends and major holidays.  You do have access to a nurse 24-7, just call the main number to the clinic [phone] and do not press any options, hold on the line and a nurse will answer the phone.    For prescription refill requests, have your pharmacy contact our office and allow 72 hours.    Due to Covid, you will need to wear a mask upon entering the hospital. If you do not  have a mask, a mask will be given to you at the Main Entrance upon arrival. For doctor visits, patients may have 1 support person age 35 or older with them. For treatment visits, patients can not have anyone with them due to social distancing guidelines and our immunocompromised population.

## 2021-02-19 NOTE — Progress Notes (Signed)
Patients port flushed without difficulty.  Good blood return noted with no bruising or swelling noted at site.  Stable during access and blood draw.  Patient to remain accessed for treatment. 

## 2021-02-19 NOTE — Patient Instructions (Signed)
[facility]  Discharge Instructions: Thank you for choosing [facility] to provide your oncology and hematology care.  If you have a lab appointment with the Cancer Center, please come in thru the Main Entrance and check in at the main information desk.  Wear comfortable clothing and clothing appropriate for easy access to any Portacath or PICC line.   We strive to give you quality time with your provider. You may need to reschedule your appointment if you arrive late (15 or more minutes).  Arriving late affects you and other patients whose appointments are after yours.  Also, if you miss three or more appointments without notifying the office, you may be dismissed from the clinic at the provider's discretion.      For prescription refill requests, have your pharmacy contact our office and allow 72 hours for refills to be completed.        To help prevent nausea and vomiting after your treatment, we encourage you to take your nausea medication as directed.  BELOW ARE SYMPTOMS THAT SHOULD BE REPORTED IMMEDIATELY: *FEVER GREATER THAN 100.4 F (38 C) OR HIGHER *CHILLS OR SWEATING *NAUSEA AND VOMITING THAT IS NOT CONTROLLED WITH YOUR NAUSEA MEDICATION *UNUSUAL SHORTNESS OF BREATH *UNUSUAL BRUISING OR BLEEDING *URINARY PROBLEMS (pain or burning when urinating, or frequent urination) *BOWEL PROBLEMS (unusual diarrhea, constipation, pain near the anus) TENDERNESS IN MOUTH AND THROAT WITH OR WITHOUT PRESENCE OF ULCERS (sore throat, sores in mouth, or a toothache) UNUSUAL RASH, SWELLING OR PAIN  UNUSUAL VAGINAL DISCHARGE OR ITCHING   Items with * indicate a potential emergency and should be followed up as soon as possible or go to the Emergency Department if any problems should occur.  Please show the CHEMOTHERAPY ALERT CARD or IMMUNOTHERAPY ALERT CARD at check-in to the Emergency Department and triage nurse.  Should you have questions after your visit or need to cancel  or reschedule your appointment, please contact [facility] [phone]  and follow the prompts.  Office hours are 8:00 a.m. to 4:30 p.m. Monday - Friday. Please note that voicemails left after 4:00 p.m. may not be returned until the following business day.  We are closed weekends and major holidays. You have access to a nurse at all times for urgent questions. Please call the main number to the clinic [phone] and follow the prompts.  For any non-urgent questions, you may also contact your provider using MyChart. We now offer e-Visits for anyone 18 and older to request care online for non-urgent symptoms. For details visit [email or web address].   Also download the MyChart app! Go to the app store, search "MyChart", open the app, select [facility], and log in with your MyChart username and password.  Due to Covid, a mask is required upon entering the hospital/clinic. If you do not have a mask, one will be given to you upon arrival. For doctor visits, patients may have 1 support person aged 18 or older with them. For treatment visits, patients cannot have anyone with them due to current Covid guidelines and our immunocompromised population.  

## 2021-02-19 NOTE — Progress Notes (Signed)
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   Patient had car trouble today, infusion rescheduled for tomorrow. Nutrition follow-up completed via phone this afternoon. Patient reports he did not feel well over the weekend, recalls having headache, nausea, and fatigue. Patient reports he is feeling some better today. Patient reports tolerating 5 1/2 cartons Osmolite 1.5 yesterday. He has done 1 feeding today. Patient reports he is looking forward to going on the great burger quest with his son once his taste returns.   Medications: reviewed  Labs: Na 133, BUN 27  Anthropometrics: Weight 142 lb 9.6 oz today stable. Patient weighed 143 lb 3.2 oz on 10/11  10/4 - 144 lb 1 oz 9/27 - 142 lb 12.8 oz  9/20 - 145 lb 15.1 oz   NUTRITION DIAGNOSIS: Inadequate oral intake continues, pt relying on tube feedings   INTERVENTION:  Continue 5-6 cartons Osmolite 1.5 via tube daily Continue drinking 1-2 CIB with whole milk as tolerated for added calories and protein Encouraged oral intake of soft, moist high protein foods as tolerated Continue baking soda salt water rinses several times daily Patient has contact information      MONITORING, EVALUATION, GOAL: weight trends, intake, tube feedings   NEXT VISIT: Monday October 24 via telephone (pt aware)

## 2021-02-20 ENCOUNTER — Ambulatory Visit (HOSPITAL_COMMUNITY): Payer: Medicare HMO | Admitting: Speech Pathology

## 2021-02-20 ENCOUNTER — Ambulatory Visit (HOSPITAL_COMMUNITY): Payer: Medicare HMO

## 2021-02-20 ENCOUNTER — Inpatient Hospital Stay (HOSPITAL_COMMUNITY): Payer: Medicare HMO

## 2021-02-20 VITALS — BP 142/57 | HR 63 | Temp 96.3°F | Resp 18

## 2021-02-20 DIAGNOSIS — Z8249 Family history of ischemic heart disease and other diseases of the circulatory system: Secondary | ICD-10-CM | POA: Diagnosis not present

## 2021-02-20 DIAGNOSIS — C099 Malignant neoplasm of tonsil, unspecified: Secondary | ICD-10-CM | POA: Diagnosis not present

## 2021-02-20 DIAGNOSIS — C098 Malignant neoplasm of overlapping sites of tonsil: Secondary | ICD-10-CM | POA: Diagnosis not present

## 2021-02-20 DIAGNOSIS — Z85528 Personal history of other malignant neoplasm of kidney: Secondary | ICD-10-CM | POA: Diagnosis not present

## 2021-02-20 DIAGNOSIS — R1312 Dysphagia, oropharyngeal phase: Secondary | ICD-10-CM | POA: Diagnosis not present

## 2021-02-20 DIAGNOSIS — Z905 Acquired absence of kidney: Secondary | ICD-10-CM | POA: Diagnosis not present

## 2021-02-20 DIAGNOSIS — I10 Essential (primary) hypertension: Secondary | ICD-10-CM | POA: Diagnosis not present

## 2021-02-20 DIAGNOSIS — E785 Hyperlipidemia, unspecified: Secondary | ICD-10-CM | POA: Diagnosis not present

## 2021-02-20 DIAGNOSIS — C770 Secondary and unspecified malignant neoplasm of lymph nodes of head, face and neck: Secondary | ICD-10-CM | POA: Diagnosis not present

## 2021-02-20 DIAGNOSIS — Z87891 Personal history of nicotine dependence: Secondary | ICD-10-CM | POA: Diagnosis not present

## 2021-02-20 DIAGNOSIS — R252 Cramp and spasm: Secondary | ICD-10-CM | POA: Diagnosis not present

## 2021-02-20 DIAGNOSIS — R1310 Dysphagia, unspecified: Secondary | ICD-10-CM | POA: Diagnosis not present

## 2021-02-20 MED ORDER — HEPARIN SOD (PORK) LOCK FLUSH 100 UNIT/ML IV SOLN
500.0000 [IU] | Freq: Once | INTRAVENOUS | Status: AC | PRN
  Administered 2021-02-20: 500 [IU]

## 2021-02-20 MED ORDER — SODIUM CHLORIDE 0.9 % IV SOLN: Freq: Once | INTRAVENOUS | Status: AC

## 2021-02-20 MED ORDER — SODIUM CHLORIDE 0.9% FLUSH
10.0000 mL | INTRAVENOUS | Status: DC | PRN
  Administered 2021-02-20: 10 mL

## 2021-02-20 MED ORDER — SODIUM CHLORIDE 0.9 % IV SOLN
150.0000 mg | Freq: Once | INTRAVENOUS | Status: AC
  Administered 2021-02-20: 150 mg via INTRAVENOUS
  Filled 2021-02-20: qty 150

## 2021-02-20 MED ORDER — MAGNESIUM SULFATE 2 GM/50ML IV SOLN
2.0000 g | Freq: Once | INTRAVENOUS | Status: AC
  Administered 2021-02-20: 2 g via INTRAVENOUS
  Filled 2021-02-20: qty 50

## 2021-02-20 MED ORDER — PALONOSETRON HCL INJECTION 0.25 MG/5ML
0.2500 mg | Freq: Once | INTRAVENOUS | Status: AC
  Administered 2021-02-20: 0.25 mg via INTRAVENOUS
  Filled 2021-02-20: qty 5

## 2021-02-20 MED ORDER — SODIUM CHLORIDE 0.9 % IV SOLN
40.0000 mg/m2 | Freq: Once | INTRAVENOUS | Status: AC
  Administered 2021-02-20: 71 mg via INTRAVENOUS
  Filled 2021-02-20: qty 71

## 2021-02-20 MED ORDER — SODIUM CHLORIDE 0.9 % IV SOLN
10.0000 mg | Freq: Once | INTRAVENOUS | Status: AC
  Administered 2021-02-20: 10 mg via INTRAVENOUS
  Filled 2021-02-20: qty 10

## 2021-02-20 MED ORDER — POTASSIUM CHLORIDE IN NACL 20-0.9 MEQ/L-% IV SOLN
Freq: Once | INTRAVENOUS | Status: AC
  Filled 2021-02-20: qty 1000

## 2021-02-20 NOTE — Progress Notes (Signed)
Treatment given per orders. Patient tolerated it well without problems. Vitals stable and discharged home from clinic ambulatory. Follow up as scheduled.  

## 2021-02-20 NOTE — Patient Instructions (Signed)
[facility]  Discharge Instructions: Thank you for choosing [facility] to provide your oncology and hematology care.  If you have a lab appointment with the Cancer Center, please come in thru the Main Entrance and check in at the main information desk.  Wear comfortable clothing and clothing appropriate for easy access to any Portacath or PICC line.   We strive to give you quality time with your provider. You may need to reschedule your appointment if you arrive late (15 or more minutes).  Arriving late affects you and other patients whose appointments are after yours.  Also, if you miss three or more appointments without notifying the office, you may be dismissed from the clinic at the provider's discretion.      For prescription refill requests, have your pharmacy contact our office and allow 72 hours for refills to be completed.        To help prevent nausea and vomiting after your treatment, we encourage you to take your nausea medication as directed.  BELOW ARE SYMPTOMS THAT SHOULD BE REPORTED IMMEDIATELY: *FEVER GREATER THAN 100.4 F (38 C) OR HIGHER *CHILLS OR SWEATING *NAUSEA AND VOMITING THAT IS NOT CONTROLLED WITH YOUR NAUSEA MEDICATION *UNUSUAL SHORTNESS OF BREATH *UNUSUAL BRUISING OR BLEEDING *URINARY PROBLEMS (pain or burning when urinating, or frequent urination) *BOWEL PROBLEMS (unusual diarrhea, constipation, pain near the anus) TENDERNESS IN MOUTH AND THROAT WITH OR WITHOUT PRESENCE OF ULCERS (sore throat, sores in mouth, or a toothache) UNUSUAL RASH, SWELLING OR PAIN  UNUSUAL VAGINAL DISCHARGE OR ITCHING   Items with * indicate a potential emergency and should be followed up as soon as possible or go to the Emergency Department if any problems should occur.  Please show the CHEMOTHERAPY ALERT CARD or IMMUNOTHERAPY ALERT CARD at check-in to the Emergency Department and triage nurse.  Should you have questions after your visit or need to cancel  or reschedule your appointment, please contact [facility] [phone]  and follow the prompts.  Office hours are 8:00 a.m. to 4:30 p.m. Monday - Friday. Please note that voicemails left after 4:00 p.m. may not be returned until the following business day.  We are closed weekends and major holidays. You have access to a nurse at all times for urgent questions. Please call the main number to the clinic [phone] and follow the prompts.  For any non-urgent questions, you may also contact your provider using MyChart. We now offer e-Visits for anyone 18 and older to request care online for non-urgent symptoms. For details visit [email or web address].   Also download the MyChart app! Go to the app store, search "MyChart", open the app, select [facility], and log in with your MyChart username and password.  Due to Covid, a mask is required upon entering the hospital/clinic. If you do not have a mask, one will be given to you upon arrival. For doctor visits, patients may have 1 support person aged 18 or older with them. For treatment visits, patients cannot have anyone with them due to current Covid guidelines and our immunocompromised population.  

## 2021-02-21 ENCOUNTER — Other Ambulatory Visit: Payer: Self-pay

## 2021-02-21 ENCOUNTER — Ambulatory Visit (HOSPITAL_COMMUNITY): Payer: Medicare HMO | Admitting: Speech Pathology

## 2021-02-21 ENCOUNTER — Encounter (HOSPITAL_COMMUNITY): Payer: Self-pay | Admitting: Speech Pathology

## 2021-02-21 DIAGNOSIS — Z87891 Personal history of nicotine dependence: Secondary | ICD-10-CM | POA: Diagnosis not present

## 2021-02-21 DIAGNOSIS — R252 Cramp and spasm: Secondary | ICD-10-CM | POA: Diagnosis not present

## 2021-02-21 DIAGNOSIS — C098 Malignant neoplasm of overlapping sites of tonsil: Secondary | ICD-10-CM | POA: Diagnosis not present

## 2021-02-21 DIAGNOSIS — E785 Hyperlipidemia, unspecified: Secondary | ICD-10-CM | POA: Diagnosis not present

## 2021-02-21 DIAGNOSIS — R1310 Dysphagia, unspecified: Secondary | ICD-10-CM

## 2021-02-21 DIAGNOSIS — Z905 Acquired absence of kidney: Secondary | ICD-10-CM | POA: Diagnosis not present

## 2021-02-21 DIAGNOSIS — Z8249 Family history of ischemic heart disease and other diseases of the circulatory system: Secondary | ICD-10-CM | POA: Diagnosis not present

## 2021-02-21 DIAGNOSIS — C099 Malignant neoplasm of tonsil, unspecified: Secondary | ICD-10-CM | POA: Diagnosis not present

## 2021-02-21 DIAGNOSIS — R1312 Dysphagia, oropharyngeal phase: Secondary | ICD-10-CM | POA: Diagnosis not present

## 2021-02-21 DIAGNOSIS — Z85528 Personal history of other malignant neoplasm of kidney: Secondary | ICD-10-CM | POA: Diagnosis not present

## 2021-02-21 DIAGNOSIS — I10 Essential (primary) hypertension: Secondary | ICD-10-CM | POA: Diagnosis not present

## 2021-02-21 DIAGNOSIS — C770 Secondary and unspecified malignant neoplasm of lymph nodes of head, face and neck: Secondary | ICD-10-CM | POA: Diagnosis not present

## 2021-02-21 NOTE — Therapy (Signed)
[place], [place], [address] Phone: [phone]   Fax:  [phone]  Speech Language Pathology Treatment  Patient Details  Name: Douglas [name] MRN: [id number] Date of Birth: 16-Dec-1945 No data recorded  Encounter Date: 02/21/2021   End of Session - 02/21/21 1136     Visit Number 3    Number of Visits 4    Authorization Type [employer]   Cohere approved ST 9/22-10/28/22 [id number]  [employer] mcr hmo eff 01.01.2022  $20.00 co-pay  no ded  oop max $3,900.00/ $280.09 met   SLP Start Time 1030    SLP Stop Time  1115    SLP Time Calculation (min) 45 min    Activity Tolerance Patient tolerated treatment well             Past Medical History:  Diagnosis Date   Arthritis    Cancer ([place])    Skin, and Kidney   Hypertension    Pre-diabetes    Skin cancer     Past Surgical History:  Procedure Laterality Date   COLONOSCOPY     HERNIA REPAIR     IR GASTROSTOMY TUBE MOD SED  12/11/2020   IR IMAGING GUIDED PORT INSERTION  12/11/2020   LAPAROSCOPIC PARTIAL NEPHRECTOMY Left    PARTIAL NEPHRECTOMY Left 2009   REVERSE SHOULDER ARTHROPLASTY Right 09/10/2019   Procedure: REVERSE SHOULDER ARTHROPLASTY;  Surgeon: [name], MD;  Location: [facility];  Service: Orthopedics;  Laterality: Right;  interscalene block   SKIN SURGERY      There were no vitals filed for this visit.   Subjective Assessment - 02/21/21 1131     Subjective "I haven't been eating anything."    Currently in Pain? No/denies              ADULT SLP TREATMENT - 02/21/21 1132       General Information   Behavior/Cognition Alert;Cooperative;Pleasant mood    Patient Positioning Upright in chair    Oral care provided N/A    HPI Douglas [name] is a 75 year old male who was referred by Dr. 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 Pt receiving concurrent chemotherapy and radiation therapy. He had a PEG placed on 12/11/2020. Chemo and radiation should be completed in mid October. He reports some difficulty swallowing solid foods.      Treatment Provided   Treatment provided Dysphagia      Dysphagia Treatment   Temperature Spikes Noted No    Respiratory Status Room air    Oral Cavity - Dentition Adequate natural dentition    Treatment Methods Skilled observation;Compensation strategy training;Patient/caregiver education;Therapeutic exercise    Patient observed directly with PO's Yes    Type of PO's observed Thin liquids;Dysphagia 3 (soft)    Feeding Able to feed self    Liquids provided via Cup    Type of cueing Verbal    Amount of cueing Minimal      Pain Assessment   Pain Assessment No/denies pain      Assessment / Recommendations / Plan   Plan Continue with current plan of care;MBS   MBSS tomorrow, 02/22/21     Dysphagia Recommendations   Diet recommendations Regular;Thin liquid    Liquids provided via Cup    Medication Administration Whole meds with liquid    Supervision Patient able  to self feed    Compensations Small sips/bites    Postural Changes and/or Swallow Maneuvers Chin tuck      General Recommendations   Oral Care Recommendations Oral care BID      Progression Toward Goals   Progression toward goals Progressing toward goals                SLP Short Term Goals - 02/21/21 1137       SLP SHORT TERM GOAL #1   Title Pt will demonstrate safe and efficient consumption of self regulated regular textures with thin liquids with use of strategies as needed.    Baseline semi soft and thin    Time 4    Period Weeks    Status On-going    Target Date 03/08/21      SLP SHORT TERM GOAL #2   Title Pt will complete pharyngeal swallowing exercises as assigned with use of written cue after initial introduction/model from SLP    Baseline limited exercises introduced  this date    Time 4    Period Weeks    Status On-going    Target Date 03/08/21      SLP SHORT TERM GOAL #3   Title Pt will verbalize 3 signs/symptoms of aspiration pneumonia with min assist.    Baseline introduced    Time 4    Period Weeks    Status On-going    Target Date 03/08/21      SLP SHORT TERM GOAL #4   Title Pt will use trismus splint as assigned daily via self report and use of written cues.    Baseline needs splint    Time 4    Period Weeks    Status On-going    Target Date 03/08/21                Plan - 02/21/21 1136     Clinical Impression Statement MBSS rescheduled for tomorrow due to scheduling conflict on behalf of the Pt on Tuesday. He completed chemo yesterday and finishes radiation on Monday. He reports po intake of only coffee and is relying on Osmolite 5.5 cans a day. SLP reinforced the need to eat by mouth in order to have PEG removed. In session, he consumed 4 oz of diced peaches and SLP recorded it on his log. He was encouraged to increase po intake and record on the log. Pt has been using trismus device and has progressed to MJO of 22m, previously 35 and 31 mm. Pt to continue with oropharyngeal swalling exercised as previously assigned and complete MBSS on Thursday.   Speech Therapy Frequency --   3 visits over the next month   Treatment/Interventions Aspiration precaution training;Patient/family education;SLP instruction and feedback    Potential to Achieve Goals Good    SLP Home Exercise Plan Pt will completed HEP as assigned to facilitate carryover of treatment strategies and techniques in home environment with use of written cues as needed.    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

## 2021-02-22 ENCOUNTER — Ambulatory Visit (HOSPITAL_COMMUNITY): Payer: Medicare HMO | Admitting: Speech Pathology

## 2021-02-22 ENCOUNTER — Ambulatory Visit (HOSPITAL_COMMUNITY)
Admission: RE | Admit: 2021-02-22 | Discharge: 2021-02-22 | Disposition: A | Discharge status: Home / Self Care | Payer: Medicare HMO | Source: Ambulatory Visit | Attending: Hematology | Admitting: Hematology

## 2021-02-22 ENCOUNTER — Encounter (HOSPITAL_COMMUNITY): Payer: Self-pay | Admitting: Speech Pathology

## 2021-02-22 DIAGNOSIS — Z905 Acquired absence of kidney: Secondary | ICD-10-CM | POA: Diagnosis not present

## 2021-02-22 DIAGNOSIS — Z85528 Personal history of other malignant neoplasm of kidney: Secondary | ICD-10-CM | POA: Diagnosis not present

## 2021-02-22 DIAGNOSIS — C770 Secondary and unspecified malignant neoplasm of lymph nodes of head, face and neck: Secondary | ICD-10-CM | POA: Diagnosis not present

## 2021-02-22 DIAGNOSIS — T17908D Unspecified foreign body in respiratory tract, part unspecified causing other injury, subsequent encounter: Secondary | ICD-10-CM

## 2021-02-22 DIAGNOSIS — R252 Cramp and spasm: Secondary | ICD-10-CM | POA: Diagnosis not present

## 2021-02-22 DIAGNOSIS — R1310 Dysphagia, unspecified: Secondary | ICD-10-CM | POA: Diagnosis not present

## 2021-02-22 DIAGNOSIS — R1312 Dysphagia, oropharyngeal phase: Secondary | ICD-10-CM

## 2021-02-22 DIAGNOSIS — C099 Malignant neoplasm of tonsil, unspecified: Secondary | ICD-10-CM | POA: Diagnosis not present

## 2021-02-22 DIAGNOSIS — E785 Hyperlipidemia, unspecified: Secondary | ICD-10-CM | POA: Diagnosis not present

## 2021-02-22 DIAGNOSIS — Z8249 Family history of ischemic heart disease and other diseases of the circulatory system: Secondary | ICD-10-CM | POA: Diagnosis not present

## 2021-02-22 DIAGNOSIS — Z87891 Personal history of nicotine dependence: Secondary | ICD-10-CM | POA: Diagnosis not present

## 2021-02-22 DIAGNOSIS — R1319 Other dysphagia: Secondary | ICD-10-CM | POA: Diagnosis not present

## 2021-02-22 DIAGNOSIS — C098 Malignant neoplasm of overlapping sites of tonsil: Secondary | ICD-10-CM | POA: Diagnosis not present

## 2021-02-22 DIAGNOSIS — I10 Essential (primary) hypertension: Secondary | ICD-10-CM | POA: Diagnosis not present

## 2021-02-22 NOTE — Therapy (Signed)
[place], [place], [address] Phone: [phone]   Fax:  [phone]  Modified Barium Swallow  Patient Details  Name: Douglas [name] MRN: [id number] Date of Birth: 09/12/1945 No data recorded  Encounter Date: 02/22/2021   End of Session - 02/22/21 1449     Visit Number 4    Number of Visits 5    Authorization Type [employer]   Cohere approved ST 9/22-10/28/22 GGYI#[id number]  [employer] mcr hmo eff 01.01.2022  $20.00 co-pay  no ded  oop max $3,900.00/ $280.09 met   SLP Start Time 1335    SLP Stop Time  1400    SLP Time Calculation (min) 25 min    Activity Tolerance Patient tolerated treatment well             Past Medical History:  Diagnosis Date   Arthritis    Cancer ([place])    Skin, and Kidney   Hypertension    Pre-diabetes    Skin cancer     Past Surgical History:  Procedure Laterality Date   COLONOSCOPY     HERNIA REPAIR     IR GASTROSTOMY TUBE MOD SED  12/11/2020   IR IMAGING GUIDED PORT INSERTION  12/11/2020   LAPAROSCOPIC PARTIAL NEPHRECTOMY Left    PARTIAL NEPHRECTOMY Left 2009   REVERSE SHOULDER ARTHROPLASTY Right 09/10/2019   Procedure: REVERSE SHOULDER ARTHROPLASTY;  Surgeon: [name], MD;  Location: [facility];  Service: Orthopedics;  Laterality: Right;  interscalene block   SKIN SURGERY      There were no vitals filed for this visit.   Subjective Assessment - 02/22/21 1441     Subjective "I tried some pizza yesterday and that burned."    Special Tests MBSS    Currently in Pain? No/denies                 General - 02/22/21 1442       General Information   Date of Onset 11/29/20    HPI Douglas [name] is a 75 year old male who was referred by Dr. 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 Pt receiving concurrent chemotherapy and  radiation therapy. He had a PEG placed on 12/11/2020. Chemo and radiation should be completed in mid October. He reports some difficulty swallowing solid foods.    Type of Study MBS-Modified Barium Swallow Study    Diet Prior to this Study Regular;Thin liquids    Temperature Spikes Noted No    Respiratory Status Room air    History of Recent Intubation No    Behavior/Cognition Alert;Cooperative;Pleasant mood    Oral Cavity Assessment Within Functional Limits    Oral Care Completed by SLP No    Oral Cavity - Dentition Adequate natural dentition;Missing dentition    Vision Functional for self feeding    Self-Feeding Abilities Able to feed self    Baseline Vocal Quality Normal    Volitional Cough Strong    Volitional Swallow Able to elicit    Anatomy Within functional limits    Pharyngeal Secretions Not observed secondary MBS                Oral Preparation/Oral Phase - 02/22/21 1442       Oral Preparation/Oral Phase   Oral Phase Impaired      Oral - Solids   Oral -  Regular Delayed A-P transit   likely impacted by reduced saliva     Electrical stimulation - Oral Phase   Was Electrical Stimulation Used No              Pharyngeal Phase - 02/22/21 1443       Pharyngeal Phase   Pharyngeal Phase Impaired      Pharyngeal - Thin   Pharyngeal- Thin Teaspoon Delayed swallow initiation;Swallow initiation at pyriform sinus;Pharyngeal residue - pyriform    Pharyngeal- Thin Cup Delayed swallow initiation;Swallow initiation at pyriform sinus;Pharyngeal residue - valleculae;Pharyngeal residue - pyriform;Penetration/Aspiration during swallow    Pharyngeal Material does not enter airway;Material enters airway, remains ABOVE vocal cords then ejected out    Pharyngeal- Thin Straw Delayed swallow initiation;Swallow initiation at pyriform sinus;Pharyngeal residue - pyriform;Pharyngeal residue - valleculae;Penetration/Aspiration during swallow;Lateral channel residue    Pharyngeal  Material does not enter airway;Material enters airway, remains ABOVE vocal cords then ejected out      Pharyngeal - Solids   Pharyngeal- Puree Swallow initiation at vallecula;Reduced tongue base retraction;Pharyngeal residue - valleculae    Pharyngeal- Regular Delayed swallow initiation-vallecula;Reduced tongue base retraction;Pharyngeal residue - posterior pharnyx;Pharyngeal residue - valleculae    Pharyngeal- Pill Swallow initiation at vallecula;Reduced tongue base retraction;Pharyngeal residue - valleculae;Pharyngeal residue - pyriform;Lateral channel residue      Pharyngeal Phase - Comment   Pharyngeal Comment Repeat/dry swallows clears pharyngeal residuals      Electrical Stimulation - Pharyngeal Phase   Was Electrical Stimulation Used No              Cricopharyngeal Phase - 02/22/21 1448       Cervical Esophageal Phase   Cervical Esophageal Phase Within functional limits                  SLP Short Term Goals - 02/22/21 1457       SLP SHORT TERM GOAL #1   Title Pt will demonstrate safe and efficient consumption of self regulated regular textures with thin liquids with use of strategies as needed.    Baseline semi soft and thin    Time 4    Period Weeks    Status On-going    Target Date 03/08/21      SLP SHORT TERM GOAL #2   Title Pt will complete pharyngeal swallowing exercises as assigned with use of written cue after initial introduction/model from SLP    Baseline limited exercises introduced this date    Time 4    Period Weeks    Status On-going    Target Date 03/08/21      SLP SHORT TERM GOAL #3   Title Pt will verbalize 3 signs/symptoms of aspiration pneumonia with min assist.    Baseline introduced    Time 4    Period Weeks    Status On-going    Target Date 03/08/21      SLP SHORT TERM GOAL #4   Title Pt will use trismus splint as assigned daily via self report and use of written cues.    Baseline needs splint    Time 4    Period Weeks     Status On-going    Target Date 03/08/21                Plan - 02/22/21 1450     Clinical Impression Statement MBSS completed this date. Pt presents with min oropharyngeal dysphagia characerized by min prolonged oral prep with solids likely due to xerostomia, min delay in swallow  initiation with liquids with swallow trigger after spilling to the pyriforms with thins via tsp/cup/straw and min decreased tongue base retraction resulting in flash penetration of thins during the swallow without aspiration observed and min vallecular, lateral channel, and pyriform residue with thins which cleared with repeat/dry swallow, and mi/mod vallecular residue with dry solids which also cleared with repeat/dry swallows. Pt was aware of regular textures residue and expressed globus sensation. Pt independently completed a secondary or dry swallow with each swallow to clear. Recommend self regulated regular textures and thin liquids with implementation of effortful/hard swallow and swallow 2x for each bite/sip. Pt will have his last radiation treatment next week and should continue with swallowing and trismus exercises going forward. SLP will see Pt for dysphagia treatment next week.    Speech Therapy Frequency 1x /week   3 visits over the next month   Duration 1 week    Treatment/Interventions Aspiration precaution training;Patient/family education;SLP instruction and feedback    Potential to Achieve Goals Good    SLP Home Exercise Plan Pt will completed HEP as assigned to facilitate carryover of treatment strategies and techniques in home environment with use of written cues as needed.    Consulted and Agree with Plan of Care Patient             Patient will benefit from skilled therapeutic intervention in order to improve the following deficits and impairments:   Dysphagia, oropharyngeal phase     Recommendations/Treatment - 02/22/21 1448       Swallow Evaluation Recommendations   SLP Diet  Recommendations Age appropriate regular;Thin    Liquid Administration via Cup    Medication Administration Whole meds with liquid    Supervision Patient able to self feed    Compensations Multiple dry swallows after each bite/sip;Effortful swallow    Postural Changes Seated upright at 90 degrees;Remain upright for at least 30 minutes after feeds/meals              Prognosis - 02/22/21 1449       Prognosis   Prognosis for Safe Diet Advancement Good      Individuals Consulted   Consulted and Agree with Results and Recommendations Patient    Report Sent to  Referring physician             Problem List Patient Active Problem List   Diagnosis Date Noted   Tonsil cancer ([place]) 11/29/2020   History of kidney cancer 11/29/2020   Weight loss 11/29/2020   History of skin cancer 11/29/2020   Hearing loss 11/29/2020   S/P shoulder replacement, 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

## 2021-02-23 DIAGNOSIS — Z905 Acquired absence of kidney: Secondary | ICD-10-CM | POA: Diagnosis not present

## 2021-02-23 DIAGNOSIS — Z87891 Personal history of nicotine dependence: Secondary | ICD-10-CM | POA: Diagnosis not present

## 2021-02-23 DIAGNOSIS — Z85528 Personal history of other malignant neoplasm of kidney: Secondary | ICD-10-CM | POA: Diagnosis not present

## 2021-02-23 DIAGNOSIS — C098 Malignant neoplasm of overlapping sites of tonsil: Secondary | ICD-10-CM | POA: Diagnosis not present

## 2021-02-23 DIAGNOSIS — E785 Hyperlipidemia, unspecified: Secondary | ICD-10-CM | POA: Diagnosis not present

## 2021-02-23 DIAGNOSIS — Z8249 Family history of ischemic heart disease and other diseases of the circulatory system: Secondary | ICD-10-CM | POA: Diagnosis not present

## 2021-02-23 DIAGNOSIS — C770 Secondary and unspecified malignant neoplasm of lymph nodes of head, face and neck: Secondary | ICD-10-CM | POA: Diagnosis not present

## 2021-02-23 DIAGNOSIS — C099 Malignant neoplasm of tonsil, unspecified: Secondary | ICD-10-CM | POA: Diagnosis not present

## 2021-02-23 DIAGNOSIS — I10 Essential (primary) hypertension: Secondary | ICD-10-CM | POA: Diagnosis not present

## 2021-02-26 ENCOUNTER — Ambulatory Visit (HOSPITAL_COMMUNITY): Payer: Medicare HMO | Admitting: Dietician

## 2021-02-26 DIAGNOSIS — I10 Essential (primary) hypertension: Secondary | ICD-10-CM | POA: Diagnosis not present

## 2021-02-26 DIAGNOSIS — C098 Malignant neoplasm of overlapping sites of tonsil: Secondary | ICD-10-CM | POA: Diagnosis not present

## 2021-02-26 DIAGNOSIS — Z8249 Family history of ischemic heart disease and other diseases of the circulatory system: Secondary | ICD-10-CM | POA: Diagnosis not present

## 2021-02-26 DIAGNOSIS — Z87891 Personal history of nicotine dependence: Secondary | ICD-10-CM | POA: Diagnosis not present

## 2021-02-26 DIAGNOSIS — Z85528 Personal history of other malignant neoplasm of kidney: Secondary | ICD-10-CM | POA: Diagnosis not present

## 2021-02-26 DIAGNOSIS — C099 Malignant neoplasm of tonsil, unspecified: Secondary | ICD-10-CM | POA: Diagnosis not present

## 2021-02-26 DIAGNOSIS — Z905 Acquired absence of kidney: Secondary | ICD-10-CM | POA: Diagnosis not present

## 2021-02-26 DIAGNOSIS — E785 Hyperlipidemia, unspecified: Secondary | ICD-10-CM | POA: Diagnosis not present

## 2021-02-26 DIAGNOSIS — C770 Secondary and unspecified malignant neoplasm of lymph nodes of head, face and neck: Secondary | ICD-10-CM | POA: Diagnosis not present

## 2021-02-26 NOTE — Progress Notes (Signed)
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 Patient completed cycle 7 cisplatin 10/18. Final radiation treatment completed today.   Noted MBS on 10/20 - min oropharyngeal dysphagia. SLP diet recommendations for regular textures; thin liquids.  Spoke with patient via telephone. Patient reports he finished his last radiation this morning. He reports feeling a little tired this morning, denies nausea, vomiting, diarrhea. Patient reports finding that Sundays have been his hardest days over the past few weeks. He feels tired, has nausea and poor appetite. Patient has been eating more soft, smooth foods, recalls yogurts and fruit cup. He is going to try oatmeal this morning. He has been doing 6 cartons of Osmolite 1.5 most everyday. He reports 4 1/2 cartons the past couple of days. Patient is looking forward to getting back to work, reports he is planning to return after Thanksgiving a couple days a week.    Medications: reviewed  Labs: reviewed  Anthropometrics: Reports he has gained 2 lbs, was 144 lb at radiation this morning. Last weight 142 lb 9.6 oz on 10/17 stable.   10/11 - 143 lb 3.2 oz 10/4 - 144 lb 1 oz 9/27 - 142 lb 12.8 oz  9/20 - 145 lb 15.1 oz   NUTRITION DIAGNOSIS: Inadequate oral intake ongoing, pt relying on tube feedings   INTERVENTION:  Congratulated patient on completing treatment Encouraged eating soft smooth foods orally as tolerated  Continue giving 5-6 cartons Osmolite 1.5 via tube daily Continue baking soda salt water rinses several times day Encouraged completing daily swallowing exercises per SLP Patient has contact information     MONITORING, EVALUATION, GOAL: weight trends, intake, tube feedings   NEXT VISIT: Thursday November 3 via telephone (pt aware)

## 2021-02-27 DIAGNOSIS — M25511 Pain in right shoulder: Secondary | ICD-10-CM | POA: Diagnosis not present

## 2021-02-28 ENCOUNTER — Other Ambulatory Visit: Payer: Self-pay

## 2021-02-28 ENCOUNTER — Ambulatory Visit (HOSPITAL_COMMUNITY): Payer: Medicare HMO | Admitting: Speech Pathology

## 2021-02-28 ENCOUNTER — Encounter (HOSPITAL_COMMUNITY): Payer: Self-pay | Admitting: Speech Pathology

## 2021-02-28 DIAGNOSIS — C099 Malignant neoplasm of tonsil, unspecified: Secondary | ICD-10-CM | POA: Diagnosis not present

## 2021-02-28 DIAGNOSIS — R252 Cramp and spasm: Secondary | ICD-10-CM | POA: Diagnosis not present

## 2021-02-28 DIAGNOSIS — R1310 Dysphagia, unspecified: Secondary | ICD-10-CM | POA: Diagnosis not present

## 2021-02-28 DIAGNOSIS — R1312 Dysphagia, oropharyngeal phase: Secondary | ICD-10-CM | POA: Diagnosis not present

## 2021-02-28 NOTE — Therapy (Signed)
[facility] [place], [place], [address] Phone: [phone]   Fax:  [phone]  Speech Language Pathology Treatment  Patient Details  Name: Douglas [name] MRN: [id number] Date of Birth: [birth year] No data recorded  Encounter Date: 02/28/2021   End of Session - 02/28/21 1133     Visit Number 5    Number of Visits 5    Date for SLP Re-Evaluation 04/19/21    Authorization Type [employer]   [employer] approved ST 9/22-10/28/22 [id number]  [employer] mcr hmo eff 01.01.2022  $20.00 co-pay  no ded  oop max $3,900.00/ $280.09 met   SLP Start Time 0945    SLP Stop Time  1030    SLP Time Calculation (min) 45 min    Activity Tolerance Patient tolerated treatment well             Past Medical History:  Diagnosis Date   Arthritis    Cancer ([place])    Skin, and Kidney   Hypertension    Pre-diabetes    Skin cancer     Past Surgical History:  Procedure Laterality Date   COLONOSCOPY     HERNIA REPAIR     IR GASTROSTOMY TUBE MOD SED  12/11/2020   IR IMAGING GUIDED PORT INSERTION  12/11/2020   LAPAROSCOPIC PARTIAL NEPHRECTOMY Left    PARTIAL NEPHRECTOMY Left 2009   REVERSE SHOULDER ARTHROPLASTY Right 09/10/2019   Procedure: REVERSE SHOULDER ARTHROPLASTY;  Surgeon: [name], MD;  Location: [facility];  Service: Orthopedics;  Laterality: Right;  interscalene block   SKIN SURGERY      There were no vitals filed for this visit.   Subjective Assessment - 02/28/21 1125     Subjective "This is the worst pain yet."    Currently in Pain? Yes    Pain Score 7     Pain Location Mouth                   ADULT SLP TREATMENT - 02/28/21 1126       General Information   Behavior/Cognition Alert;Cooperative;Pleasant mood    Patient Positioning Upright in chair    Oral care provided N/A    HPI Douglas [name] is a 75 year old male who was referred by Dr. 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 Pt receiving concurrent chemotherapy and radiation therapy. He had a PEG placed on 12/11/2020. Chemo and radiation should be completed in mid October. He reports some difficulty swallowing solid foods.      Treatment Provided   Treatment provided Dysphagia      Dysphagia Treatment   Temperature Spikes Noted No    Respiratory Status Room air    Oral Cavity - Dentition Adequate natural dentition    Treatment Methods Skilled observation;Compensation strategy training;Patient/caregiver education;Therapeutic exercise    Patient observed directly with PO's Yes    Type of PO's observed Thin liquids    Feeding Able to feed self    Liquids provided via --   bottle   Amount of cueing Independent      Pain Assessment   Pain Assessment 0-10    Pain Score 7     Pain Location mouth      Assessment / Recommendations / Plan   Plan Continue with current plan of care   f/u dysphagia treatment in early  December     Dysphagia Recommendations   Diet recommendations Regular;Thin liquid   self regulated regular textures   Liquids provided via Cup    Medication Administration Whole meds with liquid    Supervision Patient able to self feed    Compensations Small sips/bites      General Recommendations   Oral Care Recommendations Oral care BID      Progression Toward Goals   Progression toward goals Progressing toward goals              SLP Education - 02/28/21 1131     Education Details Pt to continue with trismus device, swallowing exercises, and food log    Person(s) Educated Patient    Methods Explanation;Handout    Comprehension Verbalized understanding              SLP Short Term Goals - 02/28/21 1154       SLP SHORT TERM GOAL #1   Title Pt will demonstrate safe and efficient consumption of self regulated regular textures with thin liquids with use of strategies as  needed.    Baseline semi soft and thin    Time 4    Period Weeks    Status On-going    Target Date 04/19/21      SLP SHORT TERM GOAL #2   Title Pt will complete pharyngeal swallowing exercises as assigned with use of written cue after initial introduction/model from SLP    Baseline limited exercises introduced this date    Time 4    Period Weeks    Status On-going    Target Date 04/19/21      SLP SHORT TERM GOAL #3   Title Pt will verbalize 3 signs/symptoms of aspiration pneumonia with min assist.    Baseline introduced    Time 4    Period Weeks    Status Achieved    Target Date 03/08/21      SLP SHORT TERM GOAL #4   Title Pt will use trismus splint as assigned daily via self report and use of written cues.    Baseline needs splint    Time 4    Period Weeks    Status On-going    Target Date 04/19/21                Plan - 02/28/21 1134     Clinical Impression Statement Pt reports increased pain and discomfort today from radiation therapy sequelae, however he has completed both chemo and radiation. He is using his oral rinse, but will contact his radiation oncologist about getting something stronger for pain. He has been recording in his food log and still using 5-6 cans Osmolite for now. He has a goal to have his PEG removed in December/January and understands that he needs to record po intake to help assist with RD and medical team determination of caloric intake. He continues to complete swallowing exercises and is able to return demonstrate. Maximum jaw opening today was 41mm this date. He will continue with all exercises and use food log and meet again in early December.    Speech Therapy Frequency Monthly   1 more visit in early December   Treatment/Interventions Aspiration precaution training;Patient/family education;SLP instruction and feedback    Potential to Achieve Goals Good    SLP Home Exercise Plan Pt will completed HEP as assigned to facilitate carryover of  treatment strategies and techniques in home environment with use of written cues as needed.    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  

## 2021-03-05 DIAGNOSIS — B351 Tinea unguium: Secondary | ICD-10-CM | POA: Diagnosis not present

## 2021-03-05 DIAGNOSIS — I10 Essential (primary) hypertension: Secondary | ICD-10-CM | POA: Diagnosis not present

## 2021-03-05 DIAGNOSIS — E785 Hyperlipidemia, unspecified: Secondary | ICD-10-CM | POA: Diagnosis not present

## 2021-03-05 DIAGNOSIS — C099 Malignant neoplasm of tonsil, unspecified: Secondary | ICD-10-CM | POA: Diagnosis not present

## 2021-03-07 DIAGNOSIS — C099 Malignant neoplasm of tonsil, unspecified: Secondary | ICD-10-CM | POA: Diagnosis not present

## 2021-03-08 ENCOUNTER — Telehealth (HOSPITAL_COMMUNITY): Payer: Self-pay | Admitting: Dietician

## 2021-03-08 ENCOUNTER — Encounter (HOSPITAL_COMMUNITY): Payer: Medicare HMO | Admitting: Dietician

## 2021-03-08 NOTE — Telephone Encounter (Signed)
Nutrition Follow-up:  Patient has completed chemoradiation therapy for tonsil cancer.   Spoke with patient via telephone. He reports one week of sore throat and painful swallowing after completing radiation therapy. Patient unable to tolerate oral intake besides small sips of water with medications.  Patient reports he started feeling better yesterday, made bread for the first time in over a week. He drank a CIB, but was unable to taste this. He is hopeful taste buds will return before Thanksgiving. Patient states he is feeling good today. He is going to try eating grits or oatmeal this morning. Patient continues using 5-6 cartons Osmolite/day. Denies nausea, vomiting, constipation, diarrhea.   Medications: reviewed  Labs: no new labs for review  Anthropometrics: Last weight 142 lb 9.6 lb on 10/17 stable. Patient reports 142 lb at PCP office on 11/2.   10/11 - 143 lb 3.2 oz 10/4 - 144 lb 1 oz 9/27 - 142 lb 12.8 oz 9/20 - 145 lb 15.1 oz    NUTRITION DIAGNOSIS: Inadequate oral intake ongoing, pt relying on tube feedings   INTERVENTION:  Encouraged oral intake of soft, smooth foods as tolerated Continue giving 5-6 cartons Osmolite 1.5 via tube  Continue magic mouthwash as needed Continue baking soda salt water rinses several times/day Encouraged completing daily swallowing exercises per SLP Patient has contact information     MONITORING, EVALUATION, GOAL: weight trends, intake, tube feedings   NEXT VISIT: Monday November 21 via telephone (pt aware)

## 2021-03-13 NOTE — Progress Notes (Signed)
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 There is preferential submucosal growth and the mass is indistinguishable from the left prevertebral space and lower left stylopharyngeaus. No convincing adenopathy. 2. Mixed density right upper lobe nodule, attention on anticipated PET.   11/29/2020 Initial Diagnosis   Tonsil cancer ([place])   11/29/2020 Cancer Staging   Staging form: Pharynx - HPV-Mediated Oropharynx, AJCC 8th Edition - Clinical stage from 11/29/2020: cT4, p16+ - Signed by [name], MD on 11/29/2020 Stage prefix: Initial diagnosis    01/09/2021 -  Chemotherapy   Patient is on Treatment Plan : HEAD/NECK Cisplatin q7d       CANCER STAGING: Cancer Staging Tonsil cancer [facility]) Staging form: Pharynx - HPV-Mediated Oropharynx, AJCC 8th Edition - Clinical stage from 11/29/2020: cT4, p16+ - Signed by [name], MD on 11/29/2020   INTERVAL HISTORY:  Mr. CLEDIS SOHN, a 75 y.o. male, returns for routine follow-up of his left tonsil cancer. Tery was last seen on 02/19/2021.   Today he report feeling good, and he is accompanied by his sister-in-law. He reports improved swallowing and increased activity. He denies tinnitus and tingling/numbness. He has not  required magic mouthwash in 1 week. He denies nausea. He monitors BP at home. He is using 3 tube feed daily and is slowly incorporating more solid foods.   REVIEW OF SYSTEMS:  Review of Systems  Constitutional:  Negative for appetite change (75%) and fatigue.  HENT:   Negative for sore throat (improved), tinnitus and trouble swallowing (improved).   Gastrointestinal:  Negative for nausea.  All other systems reviewed and are negative.  PAST MEDICAL/SURGICAL HISTORY:  Past Medical History:  Diagnosis Date   Arthritis    Cancer ([place])    Skin, and Kidney   Hypertension    Pre-diabetes    Skin cancer    Past Surgical History:  Procedure Laterality Date   COLONOSCOPY     HERNIA REPAIR     IR GASTROSTOMY TUBE MOD SED  12/11/2020   IR IMAGING GUIDED PORT INSERTION  12/11/2020   LAPAROSCOPIC PARTIAL NEPHRECTOMY Left    PARTIAL NEPHRECTOMY Left 2009   REVERSE SHOULDER ARTHROPLASTY Right 09/10/2019   Procedure: REVERSE SHOULDER ARTHROPLASTY;  Surgeon: [name], MD;  Location: [facility];  Service: Orthopedics;  Laterality: Right;  interscalene block   SKIN SURGERY      SOCIAL HISTORY:  Social History   Socioeconomic History   Marital status: Widowed    Spouse name: Not on file   Number of children: Not on file   Years of education: Not on file   Highest education level: Not on file  Occupational History    Employer: [employer]  Tobacco Use   Smoking status: Former    Packs/day: 1.00  Years: 20.00    Pack years: 20.00    Types: Cigarettes    Quit date: 2010    Years since quitting: 12.8   Smokeless tobacco: Never  Vaping Use   Vaping Use: Never used  Substance and Sexual Activity   Alcohol use: Yes    Alcohol/week: 1.0 standard drink    Types: 1 Cans of beer per week    Comment: occ   Drug use: Never   Sexual activity: Not on file  Other Topics Concern   Not on file  Social History Narrative   Not on file   Social Determinants of Health   Financial Resource  Strain: Low Risk    Difficulty of Paying Living Expenses: Not hard at all  Food Insecurity: No Food Insecurity   Worried About Charity fundraiser in the Last Year: Never true   [place] in the Last Year: Never true  Transportation Needs: No Transportation Needs   Lack of Transportation (Medical): No   Lack of Transportation (Non-Medical): No  Physical Activity: Sufficiently Active   Days of Exercise per Week: 7 days   Minutes of Exercise per Session: 30 min  Stress: No Stress Concern Present   Feeling of Stress : Not at all  Social Connections: Socially Isolated   Frequency of Communication with Friends and Family: More than three times a week   Frequency of Social Gatherings with Friends and Family: More than three times a week   Attends Religious Services: Never   Marine scientist or Organizations: No   Attends Archivist Meetings: Never   Marital Status: Widowed  Human resources officer Violence: Not At Risk   Fear of Current or Ex-Partner: No   Emotionally Abused: No   Physically Abused: No   Sexually Abused: No    FAMILY HISTORY:  Family History  Problem Relation Age of Onset   Diabetes Mother    Pulmonary embolism Father    Lung cancer Sister    Lung cancer Sister    Diabetes Brother    Lung cancer Brother     CURRENT MEDICATIONS:  Current Outpatient Medications  Medication Sig Dispense Refill   acetaminophen-codeine (TYLENOL #3) 300-30 MG tablet      amLODipine (NORVASC) 10 MG tablet Take 10 mg by mouth daily.     atenolol (TENORMIN) 25 MG tablet Take 25 mg by mouth daily.     cholecalciferol (VITAMIN D3) 25 MCG (1000 UNIT) tablet Take 1,000 Units by mouth daily.     CISPLATIN IV Inject 40 mg/m2 into the vein once a week.     diclofenac (VOLTAREN) 75 MG EC tablet Take 75 mg by mouth 2 (two) times daily.     gabapentin (NEURONTIN) 300 MG capsule Take 1 capsule (300 mg total) by mouth at bedtime. 30 capsule 1   HYDROcodone-acetaminophen (NORCO)  5-325 MG tablet Take 1 tablet by mouth every 8 (eight) hours as needed for moderate pain. (Patient not taking: Reported on 02/19/2021) 30 tablet 0   lidocaine-prilocaine (EMLA) cream Apply to affected area once 30 g 3   lisinopril (ZESTRIL) 40 MG tablet Take 40 mg by mouth daily.     magic mouthwash w/lidocaine SOLN Take 5 mLs by mouth 4 (four) times daily as needed for mouth pain. (Patient not taking: Reported on 02/19/2021) 250 mL 3   Menaquinone-7 (VITAMIN K2 PO) Take 1 tablet by mouth daily.     Multiple Vitamins-Minerals (MULTIVITAMIN WITH MINERALS) tablet Take 1 tablet  by mouth daily.     Nutritional Supplements (FEEDING SUPPLEMENT, OSMOLITE 1.5 CAL,) LIQD Give 6 cartons Osmolite 1.5 via tube split over 4 feedings/day. Flush tube with 60 ml water before and after each bolus feeding. Drink by mouth or give via tube additional 2.5 c fluids/day. This provides 2130 kcal, 89 grams protein, 2159 ml total water. Meets 100% needs.  0   ondansetron (ZOFRAN) 8 MG tablet Take 1 tablet (8 mg total) by mouth 2 (two) times daily as needed. Start on the third day after cisplatin chemotherapy. 30 tablet 1   prochlorperazine (COMPAZINE) 10 MG tablet Take 1 tablet (10 mg total) by mouth every 6 (six) hours as needed (Nausea or vomiting). 30 tablet 1   simvastatin (ZOCOR) 20 MG tablet Take 20 mg by mouth daily.     tamsulosin (FLOMAX) 0.4 MG CAPS capsule Take 0.8 mg by mouth daily.     zinc gluconate 50 MG tablet Take 50 mg by mouth daily.     No current facility-administered medications for this visit.    ALLERGIES:  No Known Allergies  PHYSICAL EXAM:  Performance status (ECOG): 0 - Asymptomatic  There were no vitals filed for this visit. Wt Readings from Last 3 Encounters:  02/19/21 142 lb 9.6 oz (64.7 kg)  02/13/21 143 lb 3.2 oz (65 kg)  02/06/21 144 lb 1 oz (65.3 kg)   Physical Exam Vitals reviewed.  Constitutional:      Appearance: Normal appearance.  HENT:     Mouth/Throat:     Mouth:  Mucous membranes are moist. No oral lesions.  Cardiovascular:     Rate and Rhythm: Normal rate and regular rhythm.     Pulses: Normal pulses.     Heart sounds: Normal heart sounds.  Pulmonary:     Effort: Pulmonary effort is normal.     Breath sounds: Normal breath sounds.  Musculoskeletal:     Cervical back: Neck supple.  Lymphadenopathy:     Cervical: No cervical adenopathy.     Right cervical: No superficial cervical adenopathy.    Left cervical: No superficial cervical adenopathy.  Neurological:     General: No focal deficit present.     Mental Status: He is alert and oriented to person, place, and time.  Psychiatric:        Mood and Affect: Mood normal.        Behavior: Behavior normal.     LABORATORY DATA:  I have reviewed the labs as listed.  CBC Latest Ref Rng & Units 02/19/2021 02/13/2021 02/06/2021  WBC 4.0 - 10.5 K/uL 5.9 6.1 6.5  Hemoglobin 13.0 - 17.0 g/dL 11.8(L) 11.6(L) 12.0(L)  Hematocrit 39.0 - 52.0 % 35.1(L) 34.3(L) 35.6(L)  Platelets 150 - 400 K/uL 193 160 170   CMP Latest Ref Rng & Units 02/19/2021 02/13/2021 02/06/2021  Glucose 70 - 99 mg/dL 94 119(H) 125(H)  BUN 8 - 23 mg/dL 27(H) 33(H) 31(H)  Creatinine 0.61 - 1.24 mg/dL 0.86 0.95 0.94  Sodium 135 - 145 mmol/L 133(L) 134(L) 135  Potassium 3.5 - 5.1 mmol/L 4.6 4.3 4.3  Chloride 98 - 111 mmol/L 99 98 99  CO2 22 - 32 mmol/L 27 29 29   Calcium 8.9 - 10.3 mg/dL 9.0 8.7(L) 8.9  Total Protein 6.5 - 8.1 g/dL 7.0 6.7 6.6  Total Bilirubin 0.3 - 1.2 mg/dL 0.5 0.7 0.6  Alkaline Phos 38 - 126 U/L 94 87 84  AST 15 - 41 U/L 18 19 21   ALT 0 - 44 U/L  18 19 24     DIAGNOSTIC IMAGING:  I have independently reviewed the scans and discussed with the patient. DG OP Swallowing Func-Medicare/Speech Path  Result Date: 02/22/2021 Table formatting from the original result was not included. [place], [place], [address] Phone: [phone]   Fax:  [phone]  Modified Barium Swallow Patient Details Name: DOCTOR SHEAHAN MRN: [id number] Date of Birth: 1946/03/14 No data recorded Encounter Date: 02/22/2021  End of Session - 02/22/21 1449   Visit Number 4   Number of Visits 5   Authorization Type [employer] Medicare   [place] approved ST 9/22-10/28/22 GYIR#[id number]  hum[employer] mcr hmo eff 01.01.2022  $20.00 co-pay  no ded  oop max $3,900.00/ $280.09 met  SLP Start Time 1335   SLP Stop Time  1400   SLP Time Calculation (min) 25 min   Activity Tolerance Patient tolerated treatment well     Past Medical History: Diagnosis Date  Arthritis   Cancer ([place])   Skin, and Kidney  Hypertension   Pre-diabetes   Skin cancer  Past Surgical History: Procedure Laterality Date  COLONOSCOPY    HERNIA REPAIR    IR GASTROSTOMY TUBE MOD SED  12/11/2020  IR IMAGING GUIDED PORT INSERTION  12/11/2020  LAPAROSCOPIC PARTIAL NEPHRECTOMY Left   PARTIAL NEPHRECTOMY Left 2009  REVERSE SHOULDER ARTHROPLASTY Right 09/10/2019  Procedure: REVERSE SHOULDER ARTHROPLASTY;  Surgeon: [name], MD;  Location: [facility];  Service: Orthopedics;  Laterality: Right;  interscalene block  SKIN SURGERY   There were no vitals filed for this visit.  Subjective Assessment - 02/22/21 1441   Subjective "I tried some pizza yesterday and that burned."   Special Tests MBSS   Currently in Pain? No/denies      General - 02/22/21 1442    General Information  Date of Onset 11/29/20   HPI Ryaan Vanwagoner is a 75 year old male who was referred by Dr. 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 Pt receiving concurrent chemotherapy and radiation therapy. He had a PEG placed on 12/11/2020. Chemo and radiation should be completed in mid October. He reports some difficulty swallowing solid foods.   Type of Study MBS-Modified Barium Swallow Study   Diet Prior to this Study Regular;Thin liquids   Temperature Spikes Noted  No   Respiratory Status Room air   History of Recent Intubation No   Behavior/Cognition Alert;Cooperative;Pleasant mood   Oral Cavity Assessment Within Functional Limits   Oral Care Completed by SLP No   Oral Cavity - Dentition Adequate natural dentition;Missing dentition   Vision Functional for self feeding   Self-Feeding Abilities Able to feed self   Baseline Vocal Quality Normal   Volitional Cough Strong   Volitional Swallow Able to elicit   Anatomy Within functional limits   Pharyngeal Secretions Not observed secondary MBS      Oral Preparation/Oral Phase - 02/22/21 1442    Oral Preparation/Oral Phase  Oral Phase Impaired    Oral - Solids  Oral - Regular Delayed A-P transit   likely impacted by reduced saliva   Electrical stimulation - Oral Phase  Was Electrical Stimulation Used No      Pharyngeal Phase - 02/22/21 1443    Pharyngeal Phase  Pharyngeal Phase Impaired    Pharyngeal - Thin  Pharyngeal- Thin Teaspoon Delayed swallow initiation;Swallow initiation at pyriform  sinus;Pharyngeal residue - pyriform   Pharyngeal- Thin Cup Delayed swallow initiation;Swallow initiation at pyriform sinus;Pharyngeal residue - valleculae;Pharyngeal residue - pyriform;Penetration/Aspiration during swallow   Pharyngeal Material does not enter airway;Material enters airway, remains ABOVE vocal cords then ejected out   Pharyngeal- Thin Straw Delayed swallow initiation;Swallow initiation at pyriform sinus;Pharyngeal residue - pyriform;Pharyngeal residue - valleculae;Penetration/Aspiration during swallow;Lateral channel residue   Pharyngeal Material does not enter airway;Material enters airway, remains ABOVE vocal cords then ejected out    Pharyngeal - Solids  Pharyngeal- Puree Swallow initiation at vallecula;Reduced tongue base retraction;Pharyngeal residue - valleculae   Pharyngeal- Regular Delayed swallow initiation-vallecula;Reduced tongue base retraction;Pharyngeal residue - posterior pharnyx;Pharyngeal residue - valleculae    Pharyngeal- Pill Swallow initiation at vallecula;Reduced tongue base retraction;Pharyngeal residue - valleculae;Pharyngeal residue - pyriform;Lateral channel residue    Pharyngeal Phase - Comment  Pharyngeal Comment Repeat/dry swallows clears pharyngeal residuals    Electrical Stimulation - Pharyngeal Phase  Was Electrical Stimulation Used No      Cricopharyngeal Phase - 02/22/21 1448    Cervical Esophageal Phase  Cervical Esophageal Phase Within functional limits      SLP Short Term Goals - 02/22/21 1457    SLP SHORT TERM GOAL #1  Title Pt will demonstrate safe and efficient consumption of self regulated regular textures with thin liquids with use of strategies as needed.   Baseline semi soft and thin   Time 4   Period Weeks   Status On-going   Target Date 03/08/21    SLP SHORT TERM GOAL #2  Title Pt will complete pharyngeal swallowing exercises as assigned with use of written cue after initial introduction/model from SLP   Baseline limited exercises introduced this date   Time 4   Period Weeks   Status On-going   Target Date 03/08/21    SLP SHORT TERM GOAL #3  Title Pt will verbalize 3 signs/symptoms of aspiration pneumonia with min assist.   Baseline introduced   Time 4   Period Weeks   Status On-going   Target Date 03/08/21    SLP SHORT TERM GOAL #4  Title Pt will use trismus splint as assigned daily via self report and use of written cues.   Baseline needs splint   Time 4   Period Weeks   Status On-going   Target Date 03/08/21      Plan - 02/22/21 1450   Clinical Impression Statement MBSS completed this date. Pt presents with min oropharyngeal dysphagia characerized by min prolonged oral prep with solids likely due to xerostomia, min delay in swallow initiation with liquids with swallow trigger after spilling to the pyriforms with thins via tsp/cup/straw and min decreased tongue base retraction resulting in flash penetration of thins during the swallow without aspiration observed and min vallecular, lateral  channel, and pyriform residue with thins which cleared with repeat/dry swallow, and mi/mod vallecular residue with dry solids which also cleared with repeat/dry swallows. Pt was aware of regular textures residue and expressed globus sensation. Pt independently completed a secondary or dry swallow with each swallow to clear. Recommend self regulated regular textures and thin liquids with implementation of effortful/hard swallow and swallow 2x for each bite/sip. Pt will have his last radiation treatment next week and should continue with swallowing and trismus exercises going forward. SLP will see Pt for dysphagia treatment next week.   Speech Therapy Frequency 1x /week   3 visits over the next month  Duration 1 week   Treatment/Interventions Aspiration precaution training;Patient/family education;SLP instruction and feedback  Potential to Achieve Goals Good   SLP Home Exercise Plan Pt will completed HEP as assigned to facilitate carryover of treatment strategies and techniques in home environment with use of written cues as needed.   Consulted and Agree with Plan of Care Patient     Patient will benefit from skilled therapeutic intervention in order to improve the following deficits and impairments:  Dysphagia, oropharyngeal phase  Recommendations/Treatment - 02/22/21 1448    Swallow Evaluation Recommendations  SLP Diet Recommendations Age appropriate regular;Thin   Liquid Administration via Cup   Medication Administration Whole meds with liquid   Supervision Patient able to self feed   Compensations Multiple dry swallows after each bite/sip;Effortful swallow   Postural Changes Seated upright at 90 degrees;Remain upright for at least 30 minutes after feeds/meals      Prognosis - 02/22/21 1449    Prognosis  Prognosis for Safe Diet Advancement Good    Individuals Consulted  Consulted and Agree with Results and Recommendations Patient   Report Sent to  Referring physician     Problem List Patient Active Problem List   Diagnosis Date Noted  Tonsil cancer ([place]) 11/29/2020  History of kidney cancer 11/29/2020  Weight loss 11/29/2020  History of skin cancer 11/29/2020  Hearing loss 11/29/2020  S/P shoulder replacement, right 09/10/2019 PORTER,DABNEY 02/22/2021, 2:57 PM [facility] [address] [place], [place], [address] Phone: [phone]   Fax:  [phone] Name: DEANTAE SHACKLETON MRN: [id number] Date of Birth: 12-May-1945 CLINICAL DATA:  Dysphagia. Radiation therapy related to head and neck cancer. EXAM: MODIFIED BARIUM SWALLOW TECHNIQUE: Different consistencies of barium were administered orally to the patient by the Speech Pathologist. Imaging of the pharynx was performed in the lateral projection. The radiologist was present in the fluoroscopy room for this study, providing personal supervision. FLUOROSCOPY TIME:  Fluoroscopy Time:  2 minutes, 36 seconds Radiation Exposure Index (if provided by the fluoroscopic device): 15.6 mGy Number of Acquired Spot Images: 0 COMPARISON:  None. FINDINGS: Thin liquid: Intermittent flash laryngeal penetration. Mild occasional residue in the piriform sinuses. Puree: Unremarkable Puree with cracker: Pharyngeal residue as shown for example on image 14 series 14 cleared with secondary swallows. Barium pill: Passed briskly down to the stomach. IMPRESSION: 1. Intermittent flash laryngeal penetration with thin liquids. Occasional residue in the piriform sinuses. Residue tended to clear with secondary swallows. Please refer to the Speech Pathologists report for complete details and recommendations. Electronically Signed   By: Van Clines M.D.   On: 02/22/2021 14:17     ASSESSMENT:  1.  Left tonsillar squamous cell cancer: - Left tonsillar biopsy by Dr. Benjamine Mola on 11/15/2020 consistent with squamous cell carcinoma.  P16 is strongly positive. - PET scan on 12/07/2020-shows 4.1 cm left palatine tonsil mass with SUV 16.  0.8 cm left level 3 lymph node SUV 4.4.   Elongated 0.9 x 1 cm right lower lobe nodule along the azygoesophageal recess has accentuated metabolic activity with SUV 5.8.  This has some solid component and was not visible on chest CT of 04/24/2020.  Differential includes unusual morphology of metastatic disease versus inflammatory process.  Groundglass opacity laterally in the right upper lobe likely inflammatory with SUV 1.4.  0.6 cm right middle lobe nodule not hypermetabolic. - Concurrent chemoradiation therapy started on 01/09/2021. 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  Hearing on the left side is slightly worse due to tumor.  2.  Social/family history: - He has  a retired after working for [employer].  He quit smoking 12 years ago.  He smoked 2 packs/day for 20 years. - Sister had lung cancer.  2 sisters died of lung cancer.  Brother had lung cancer.   PLAN:  1.  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 - His energy levels have improved.  Denies any GI symptoms. - Physical examination today did not reveal any left tonsillar mass.  No neck lymphadenopathy. - Reviewed labs from today which shows normal LFTs and creatinine.  CBC was grossly normal with improving hemoglobin. - Recommend follow-up in 6 weeks with a repeat PET CT scan.  2.  Left ear and headache: - This pain has completely resolved.  3.  Nutrition: - He has cut back on Osmolite to 3 cans/day. - He is also increased eating by mouth.  4.  Peripheral neuropathy: - He has developed left hip tingling at bedtime and occasional burning. - Continue gabapentin 300 mg at bedtime.   Orders placed this encounter:  No orders of the defined types were placed in this encounter.    Derek Elizardo, MD [place] [phone]   I, Thana Ates, am acting as a scribe for Dr. Derek Karmello.  I, Derek Durante MD, have reviewed the  above documentation for accuracy and completeness, and I agree with the above.

## 2021-03-14 ENCOUNTER — Other Ambulatory Visit: Payer: Self-pay

## 2021-03-14 ENCOUNTER — Inpatient Hospital Stay (HOSPITAL_COMMUNITY): Payer: Medicare HMO | Attending: Hematology | Admitting: Hematology

## 2021-03-14 ENCOUNTER — Encounter (HOSPITAL_COMMUNITY): Payer: Self-pay | Admitting: Hematology

## 2021-03-14 ENCOUNTER — Inpatient Hospital Stay (HOSPITAL_COMMUNITY): Payer: Medicare HMO

## 2021-03-14 VITALS — BP 130/70 | HR 66 | Temp 98.1°F | Resp 18 | Wt 140.5 lb

## 2021-03-14 DIAGNOSIS — Z923 Personal history of irradiation: Secondary | ICD-10-CM | POA: Insufficient documentation

## 2021-03-14 DIAGNOSIS — Z801 Family history of malignant neoplasm of trachea, bronchus and lung: Secondary | ICD-10-CM | POA: Diagnosis not present

## 2021-03-14 DIAGNOSIS — Z79899 Other long term (current) drug therapy: Secondary | ICD-10-CM | POA: Insufficient documentation

## 2021-03-14 DIAGNOSIS — I10 Essential (primary) hypertension: Secondary | ICD-10-CM | POA: Insufficient documentation

## 2021-03-14 DIAGNOSIS — H903 Sensorineural hearing loss, bilateral: Secondary | ICD-10-CM | POA: Insufficient documentation

## 2021-03-14 DIAGNOSIS — Z85528 Personal history of other malignant neoplasm of kidney: Secondary | ICD-10-CM | POA: Insufficient documentation

## 2021-03-14 DIAGNOSIS — C099 Malignant neoplasm of tonsil, unspecified: Secondary | ICD-10-CM | POA: Insufficient documentation

## 2021-03-14 DIAGNOSIS — Z85828 Personal history of other malignant neoplasm of skin: Secondary | ICD-10-CM | POA: Diagnosis not present

## 2021-03-14 DIAGNOSIS — Z905 Acquired absence of kidney: Secondary | ICD-10-CM | POA: Insufficient documentation

## 2021-03-14 DIAGNOSIS — Z87891 Personal history of nicotine dependence: Secondary | ICD-10-CM | POA: Insufficient documentation

## 2021-03-14 LAB — COMPREHENSIVE METABOLIC PANEL
ALT: 18 U/L (ref 0–44)
AST: 20 U/L (ref 15–41)
Albumin: 4 g/dL (ref 3.5–5.0)
Alkaline Phosphatase: 99 U/L (ref 38–126)
Anion gap: 10 (ref 5–15)
BUN: 27 mg/dL — ABNORMAL HIGH (ref 8–23)
CO2: 29 mmol/L (ref 22–32)
Calcium: 9.4 mg/dL (ref 8.9–10.3)
Chloride: 98 mmol/L (ref 98–111)
Creatinine, Ser: 0.97 mg/dL (ref 0.61–1.24)
GFR, Estimated: >60 mL/min (ref >60)
Glucose, Bld: 105 mg/dL — ABNORMAL HIGH (ref 70–99)
Potassium: 4.9 mmol/L (ref 3.5–5.1)
Sodium: 137 mmol/L (ref 135–145)
Total Bilirubin: 0.4 mg/dL (ref 0.3–1.2)
Total Protein: 7 g/dL (ref 6.5–8.1)

## 2021-03-14 LAB — CBC WITH DIFFERENTIAL/PLATELET
Abs Immature Granulocytes: 0.04 10*3/uL (ref 0.00–0.07)
Basophils Absolute: 0.1 10*3/uL (ref 0.0–0.1)
Basophils Relative: 1 %
Eosinophils Absolute: 0.1 10*3/uL (ref 0.0–0.5)
Eosinophils Relative: 2 %
HCT: 33.2 % — ABNORMAL LOW (ref 39.0–52.0)
Hemoglobin: 11.5 g/dL — ABNORMAL LOW (ref 13.0–17.0)
Immature Granulocytes: 1 %
Lymphocytes Relative: 15 %
Lymphs Abs: 0.7 10*3/uL (ref 0.7–4.0)
MCH: 35 pg — ABNORMAL HIGH (ref 26.0–34.0)
MCHC: 34.6 g/dL (ref 30.0–36.0)
MCV: 100.9 fL — ABNORMAL HIGH (ref 80.0–100.0)
Monocytes Absolute: 0.8 10*3/uL (ref 0.1–1.0)
Monocytes Relative: 18 %
Neutro Abs: 2.9 10*3/uL (ref 1.7–7.7)
Neutrophils Relative %: 63 %
Platelets: 268 10*3/uL (ref 150–400)
RBC: 3.29 MIL/uL — ABNORMAL LOW (ref 4.22–5.81)
RDW: 18.3 % — ABNORMAL HIGH (ref 11.5–15.5)
WBC: 4.6 10*3/uL (ref 4.0–10.5)
nRBC: 0 % (ref 0.0–0.2)

## 2021-03-14 LAB — MAGNESIUM: Magnesium: 2.2 mg/dL (ref 1.7–2.4)

## 2021-03-14 NOTE — Patient Instructions (Addendum)
[place] at [facility] Discharge Instructions  You were seen and examined today by Dr. Delton Coombes. He reviewed your most recent labs and everything looks good. He will get you scheduled for PET scan in 5 weeks. He wants you to continue to follow with Vinnie Level on how to wean off tube feeds. Please follow up as scheduled in 6 weeks.   Thank you for choosing [place] at [facility] to provide your oncology and hematology care.  To afford each patient quality time with our provider, please arrive at least 15 minutes before your scheduled appointment time.   If you have a lab appointment with the [facility] please come in thru the Main Entrance and check in at the main information desk.  You need to re-schedule your appointment should you arrive 10 or more minutes late.  We strive to give you quality time with our providers, and arriving late affects you and other patients whose appointments are after yours.  Also, if you no show three or more times for appointments you may be dismissed from the clinic at the providers discretion.     Again, thank you for choosing [facility].  Our hope is that these requests will decrease the amount of time that you wait before being seen by our physicians.       _____________________________________________________________  Should you have questions after your visit to [facility], please contact our office at [phone] and follow the prompts.  Our office hours are 8:00 a.m. and 4:30 p.m. Monday - Friday.  Please note that voicemails left after 4:00 p.m. may not be returned until the following business day.  We are closed weekends and major holidays.  You do have access to a nurse 24-7, just call the main number to the clinic [phone] and do not press any options, hold on the line and a nurse will answer the phone.    For prescription refill requests, have your pharmacy contact our  office and allow 72 hours.    Due to Covid, you will need to wear a mask upon entering the hospital. If you do not have a mask, a mask will be given to you at the Main Entrance upon arrival. For doctor visits, patients may have 1 support person age 48 or older with them. For treatment visits, patients can not have anyone with them due to social distancing guidelines and our immunocompromised population.

## 2021-03-26 ENCOUNTER — Telehealth (HOSPITAL_COMMUNITY): Payer: Self-pay | Admitting: Dietician

## 2021-03-26 ENCOUNTER — Encounter (HOSPITAL_COMMUNITY): Payer: Medicare HMO | Admitting: Dietician

## 2021-03-26 NOTE — Telephone Encounter (Signed)
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   Patient reports he is doing "great." He denies swallowing difficulty and is tolerating regular textures without difficulty. Patient reports taste is coming back with some things. He has a good appetite and eating a variety of foods (broccoli cheese casserole, salad with ranch, chicken noodle soup, cinnamon cranberry bread with butter, chips, cookies, peanut butter sandwiches) Patient is drinking 8 oz of Osmolite mixed with CIB powder, whole milk, banana, and chocolate syrup everyday as a supplement. Patient says this is delicious. He is not using tube for feeding. Patient is flushing tube with 120 ml twice daily. Patient reports having a "sneezing fit" and soreness at tube site. He denies redness or puss. Patient reports increased energy, has been working in his yard and walking the dogs.    Anthropometrics: Patient reports 142 lb on home scale this morning. Last weight 140 lb 8 oz on 11/9 slightly decreased. Patient weighed 142 lb 9.6 oz 10/17.   10/11 - 143 lb 3.2 oz 10/4 - 144 lb 1 oz 9/27 - 142 lb 12.8 oz    NUTRITION DIAGNOSIS: Inadequate oral intake improved, pt is no longer supplementing with tube feedings   INTERVENTION:  Continue eating high calorie, high protein foods for weight maintenance Continue drinking CIB with whole milk or Osmolite Shake daily for added calories and protein Patient has weaned from tube feedings, continue flushing tube daily with water Patient has contact information     MONITORING, EVALUATION, GOAL: weight trends, intake   NEXT VISIT: Thursday, December 15 in clinic

## 2021-04-03 DIAGNOSIS — Z8249 Family history of ischemic heart disease and other diseases of the circulatory system: Secondary | ICD-10-CM | POA: Diagnosis not present

## 2021-04-03 DIAGNOSIS — Z905 Acquired absence of kidney: Secondary | ICD-10-CM | POA: Diagnosis not present

## 2021-04-03 DIAGNOSIS — I10 Essential (primary) hypertension: Secondary | ICD-10-CM | POA: Diagnosis not present

## 2021-04-03 DIAGNOSIS — Z87891 Personal history of nicotine dependence: Secondary | ICD-10-CM | POA: Diagnosis not present

## 2021-04-03 DIAGNOSIS — Z85528 Personal history of other malignant neoplasm of kidney: Secondary | ICD-10-CM | POA: Diagnosis not present

## 2021-04-03 DIAGNOSIS — E785 Hyperlipidemia, unspecified: Secondary | ICD-10-CM | POA: Diagnosis not present

## 2021-04-03 DIAGNOSIS — C099 Malignant neoplasm of tonsil, unspecified: Secondary | ICD-10-CM | POA: Diagnosis not present

## 2021-04-09 DIAGNOSIS — Z1211 Encounter for screening for malignant neoplasm of colon: Secondary | ICD-10-CM | POA: Diagnosis not present

## 2021-04-11 ENCOUNTER — Other Ambulatory Visit: Payer: Self-pay

## 2021-04-11 ENCOUNTER — Ambulatory Visit (HOSPITAL_COMMUNITY): Payer: Medicare HMO | Attending: Hematology and Oncology | Admitting: Speech Pathology

## 2021-04-11 ENCOUNTER — Other Ambulatory Visit (HOSPITAL_COMMUNITY): Payer: Self-pay | Admitting: Hematology

## 2021-04-11 ENCOUNTER — Encounter (HOSPITAL_COMMUNITY): Payer: Self-pay | Admitting: Speech Pathology

## 2021-04-11 DIAGNOSIS — R1312 Dysphagia, oropharyngeal phase: Secondary | ICD-10-CM | POA: Insufficient documentation

## 2021-04-11 DIAGNOSIS — R252 Cramp and spasm: Secondary | ICD-10-CM | POA: Diagnosis not present

## 2021-04-11 NOTE — Therapy (Signed)
[facility] [place], [place], [address] Phone: [phone]   Fax:  [phone]  Speech Language Pathology Treatment  Patient Details  Name: Douglas [name] MRN: [id number] Date of Birth: December 18, 1945 No data recorded  Encounter Date: 04/11/2021   End of Session - 04/11/21 1110     Visit Number 6    Number of Visits 6    Date for SLP Re-Evaluation 04/19/21    Authorization Type [employer]   [place] approved ST 9/22-10/28/22 [id number]  [employer] mcr hmo eff 01.01.2022  $20.00 co-pay  no ded  oop max $3,900.00/ $280.09 met   SLP Start Time 1035    SLP Stop Time  1108    SLP Time Calculation (min) 33 min    Activity Tolerance Patient tolerated treatment well             Past Medical History:  Diagnosis Date   Arthritis    Cancer ([place])    Skin, and Kidney   Hypertension    Pre-diabetes    Skin cancer     Past Surgical History:  Procedure Laterality Date   COLONOSCOPY     HERNIA REPAIR     IR GASTROSTOMY TUBE MOD SED  12/11/2020   IR IMAGING GUIDED PORT INSERTION  12/11/2020   LAPAROSCOPIC PARTIAL NEPHRECTOMY Left    PARTIAL NEPHRECTOMY Left 2009   REVERSE SHOULDER ARTHROPLASTY Right 09/10/2019   Procedure: REVERSE SHOULDER ARTHROPLASTY;  Surgeon: [name], MD;  Location: [facility];  Service: Orthopedics;  Laterality: Right;  interscalene block   SKIN SURGERY      There were no vitals filed for this visit.   Subjective Assessment - 04/11/21 1107     Subjective "I am eating everything."    Currently in Pain? No/denies                   ADULT SLP TREATMENT - 04/11/21 1107       General Information   Behavior/Cognition Alert;Cooperative;Pleasant mood    Patient Positioning Upright in chair    Oral care provided N/A    HPI Douglas [name] is a 75 year old male who was referred by Dr. [name] for a clinical swallow evaluation due to recent diagnosis of left tonsillar cancer (4.1 cm left palatine  tonsillar mass has a maximum SUV of 16.1, compatible with malignancy). Pt receiving concurrent chemotherapy and radiation therapy. He had a PEG placed on 12/11/2020. Chemo and radiation should be completed in mid October. He reports some difficulty swallowing solid foods.      Treatment Provided   Treatment provided Dysphagia      Dysphagia Treatment   Temperature Spikes Noted No    Respiratory Status Room air    Oral Cavity - Dentition Adequate natural dentition    Treatment Methods Skilled observation;Compensation strategy training;Patient/caregiver education;Therapeutic exercise    Patient observed directly with PO's Yes    Type of PO's observed Regular;Thin liquids    Feeding Able to feed self    Liquids provided via Cup    Oral Phase Signs & Symptoms Prolonged bolus formation    Amount of cueing Independent      Pain Assessment   Pain Assessment No/denies pain      Assessment / Recommendations / Plan   Plan All goals met;Discharge SLP treatment due to (comment)      Dysphagia Recommendations   Diet recommendations Regular;Thin liquid    Liquids provided via Cup  Medication Administration Whole meds with liquid    Supervision Patient able to self feed      General Recommendations   Oral Care Recommendations Oral care BID    Follow up Recommendations None      Progression Toward Goals   Progression toward goals Goals met, education completed, patient discharged from SLP              SLP Education - 04/11/21 1109     Education Details Pt to continue with trismus device and swallowing exercises 1-2x daily going forward beyond SLP d/c    Person(s) Educated Patient    Methods Explanation;Handout    Comprehension Verbalized understanding;Returned demonstration              SLP Short Term Goals - 04/11/21 1125       SLP SHORT TERM GOAL #1   Title Pt will demonstrate safe and efficient consumption of self regulated regular textures with thin liquids with use of  strategies as needed.    Baseline semi soft and thin    Time 4    Period Weeks    Status Achieved    Target Date 04/19/21      SLP SHORT TERM GOAL #2   Title Pt will complete pharyngeal swallowing exercises as assigned with use of written cue after initial introduction/model from SLP    Baseline limited exercises introduced this date    Time 4    Period Weeks    Status Achieved    Target Date 04/19/21      SLP SHORT TERM GOAL #3   Title Pt will verbalize 3 signs/symptoms of aspiration pneumonia with min assist.    Baseline introduced    Time 4    Period Weeks    Status Achieved    Target Date 03/08/21      SLP SHORT TERM GOAL #4   Title Pt will use trismus splint as assigned daily via self report and use of written cues.    Baseline needs splint    Time 4    Period Weeks    Status Achieved    Target Date 04/19/21                Plan - 04/11/21 1111     Clinical Impression Statement Pt reports that he is doing very well and eating regular textures and thin liquids. He describes modest "burning" on right side of posterior palate, worse with tomato sauce and other acidic foods. He has only been using the PEG for flushing for the past 3 weeks and is drinking the Osmolite by mouth 2x/day and currently weighs 145 pounds. He continues to use the trismus device we made in therapy and his maximum intraoral opening was 68m today (was 315mon evaluation 01/25/21). Pt was recently prescribed Saligen to faciliate increased saliva production, however he reports he has only noted increased sweating so far. SLP suggested chewing sugarless gum, using artificial saliva products, and adding moisture to all solids. Pt has a PET scan tomorrow and will follow up with Dr. KaDelton Coombesext week. Will plan to d/c from SLP services for now, however Pt was encouraged to contact me should he have any changes or questions. SLP reitertated the importance of oral care, frequent dental care follow up, and  continuation of pharyngeal and trismus exercises. Pt is in agreement with plan of care and ready for discharge.    Speech Therapy Frequency --   1 more visit in early December  Treatment/Interventions Aspiration precaution training;Patient/family education;SLP instruction and feedback    Potential to Achieve Goals Good    SLP Home Exercise Plan Pt will completed HEP as assigned to facilitate carryover of treatment strategies and techniques in home environment with use of written cues as needed.    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 Patient goals were met. Patient is being discharged due to meeting the stated rehab goals..    Thank you,  Genene Churn, [place]  Teddy Spike 04/11/2021, 11:26 AM  [facility] [address] [place], [place], [address] Phone: [phone]   Fax:  [phone]   Name: Douglas [name] MRN: [id number] Date of Birth: 1945/06/10

## 2021-04-12 ENCOUNTER — Ambulatory Visit (HOSPITAL_COMMUNITY)
Admission: RE | Admit: 2021-04-12 | Discharge: 2021-04-12 | Disposition: A | Discharge status: Home / Self Care | Payer: Medicare HMO | Source: Ambulatory Visit | Attending: Hematology | Admitting: Hematology

## 2021-04-12 DIAGNOSIS — G319 Degenerative disease of nervous system, unspecified: Secondary | ICD-10-CM | POA: Diagnosis not present

## 2021-04-12 DIAGNOSIS — J439 Emphysema, unspecified: Secondary | ICD-10-CM | POA: Diagnosis not present

## 2021-04-12 DIAGNOSIS — C099 Malignant neoplasm of tonsil, unspecified: Secondary | ICD-10-CM | POA: Insufficient documentation

## 2021-04-12 DIAGNOSIS — Z85528 Personal history of other malignant neoplasm of kidney: Secondary | ICD-10-CM | POA: Diagnosis not present

## 2021-04-12 DIAGNOSIS — Z85828 Personal history of other malignant neoplasm of skin: Secondary | ICD-10-CM | POA: Diagnosis not present

## 2021-04-12 DIAGNOSIS — I2510 Atherosclerotic heart disease of native coronary artery without angina pectoris: Secondary | ICD-10-CM | POA: Diagnosis not present

## 2021-04-12 MED ORDER — FLUDEOXYGLUCOSE F - 18 (FDG) INJECTION
7.6920 | Freq: Once | INTRAVENOUS | Status: AC | PRN
  Administered 2021-04-12: 7.692 via INTRAVENOUS

## 2021-04-18 NOTE — Progress Notes (Signed)
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 There is preferential submucosal growth and the mass is indistinguishable from the left prevertebral space and lower left stylopharyngeaus. No convincing adenopathy. 2. Mixed density right upper lobe nodule, attention on anticipated PET.   11/29/2020 Initial Diagnosis   Tonsil cancer ([place])   11/29/2020 Cancer Staging   Staging form: Pharynx - HPV-Mediated Oropharynx, AJCC 8th Edition - Clinical stage from 11/29/2020: cT4, p16+ - Signed by [name], MD on 11/29/2020 Stage prefix: Initial diagnosis    01/09/2021 -  Chemotherapy   Patient is on Treatment Plan : HEAD/NECK Cisplatin q7d       CANCER STAGING:  Cancer Staging  Tonsil cancer [facility]) Staging form: Pharynx - HPV-Mediated Oropharynx, AJCC 8th Edition - Clinical stage from 11/29/2020: cT4, p16+ - Signed by [name], MD on 11/29/2020   INTERVAL HISTORY:  Mr. Douglas [name], a 75 y.o. male, returns for routine follow-up of his left tonsil cancer. Sabino was last seen on 03/14/2021.   Today he reports feeling good. He has been eating solely by mouth with no tube feeds for 1 month, and he denies difficulty swallowing. He has gained 3 pounds since 11/9. He denies  tinnitus, left ear pain, and headaches. He reports continued occasional left hip pain.   REVIEW OF SYSTEMS:  Review of Systems  Constitutional:  Negative for appetite change, fatigue and unexpected weight change (+3 lbs).  HENT:   Negative for trouble swallowing.   Musculoskeletal:  Positive for arthralgias (L hip pain).  All other systems reviewed and are negative.  PAST MEDICAL/SURGICAL HISTORY:  Past Medical History:  Diagnosis Date   Arthritis    Cancer ([place])    Skin, and Kidney   Hypertension    Pre-diabetes    Skin cancer    Past Surgical History:  Procedure Laterality Date   COLONOSCOPY     HERNIA REPAIR     IR GASTROSTOMY TUBE MOD SED  12/11/2020   IR IMAGING GUIDED PORT INSERTION  12/11/2020   LAPAROSCOPIC PARTIAL NEPHRECTOMY Left    PARTIAL NEPHRECTOMY Left 2009   REVERSE SHOULDER ARTHROPLASTY Right 09/10/2019   Procedure: REVERSE SHOULDER ARTHROPLASTY;  Surgeon: [name], MD;  Location: [facility];  Service: Orthopedics;  Laterality: Right;  interscalene block   SKIN SURGERY      SOCIAL HISTORY:  Social History   Socioeconomic History   Marital status: Widowed    Spouse name: Not on file   Number of children: Not on file   Years of education: Not on file   Highest education level: Not on file  Occupational History    Employer: [employer]  Tobacco Use   Smoking status: Former    Packs/day: 1.00    Years: 20.00  Pack years: 20.00    Types: Cigarettes    Quit date: 2010    Years since quitting: 12.9   Smokeless tobacco: Never  Vaping Use   Vaping Use: Never used  Substance and Sexual Activity   Alcohol use: Yes    Alcohol/week: 1.0 standard drink    Types: 1 Cans of beer per week    Comment: occ   Drug use: Never   Sexual activity: Not on file  Other Topics Concern   Not on file  Social History Narrative   Not on file   Social Determinants of Health   Financial Resource Strain: Low Risk    Difficulty of Paying Living Expenses: Not hard at all   Food Insecurity: No Food Insecurity   Worried About Charity fundraiser in the Last Year: Never true   [place] in the Last Year: Never true  Transportation Needs: No Transportation Needs   Lack of Transportation (Medical): No   Lack of Transportation (Non-Medical): No  Physical Activity: Sufficiently Active   Days of Exercise per Week: 7 days   Minutes of Exercise per Session: 30 min  Stress: No Stress Concern Present   Feeling of Stress : Not at all  Social Connections: Socially Isolated   Frequency of Communication with Friends and Family: More than three times a week   Frequency of Social Gatherings with Friends and Family: More than three times a week   Attends Religious Services: Never   Marine scientist or Organizations: No   Attends Archivist Meetings: Never   Marital Status: Widowed  Human resources officer Violence: Not At Risk   Fear of Current or Ex-Partner: No   Emotionally Abused: No   Physically Abused: No   Sexually Abused: No    FAMILY HISTORY:  Family History  Problem Relation Age of Onset   Diabetes Mother    Pulmonary embolism Father    Lung cancer Sister    Lung cancer Sister    Diabetes Brother    Lung cancer Brother     CURRENT MEDICATIONS:  Current Outpatient Medications  Medication Sig Dispense Refill   acetaminophen-codeine (TYLENOL #3) 300-30 MG tablet      amLODipine (NORVASC) 10 MG tablet Take 10 mg by mouth daily.     atenolol (TENORMIN) 25 MG tablet Take 25 mg by mouth daily.     cholecalciferol (VITAMIN D3) 25 MCG (1000 UNIT) tablet Take 1,000 Units by mouth daily.     diclofenac (VOLTAREN) 75 MG EC tablet Take 75 mg by mouth 2 (two) times daily.     gabapentin (NEURONTIN) 300 MG capsule TAKE ONE CAPSULE BY MOUTH AT BEDTIME 30 capsule 6   HYDROcodone-acetaminophen (NORCO) 5-325 MG tablet Take 1 tablet by mouth every 8 (eight) hours as needed for moderate pain. 30 tablet 0   lisinopril (ZESTRIL) 40 MG tablet Take 40 mg by  mouth daily.     magic mouthwash w/lidocaine SOLN Take 5 mLs by mouth 4 (four) times daily as needed for mouth pain. 250 mL 3   Menaquinone-7 (VITAMIN K2 PO) Take 1 tablet by mouth daily.     Multiple Vitamins-Minerals (MULTIVITAMIN WITH MINERALS) tablet Take 1 tablet by mouth daily.     Nutritional Supplements (FEEDING SUPPLEMENT, OSMOLITE 1.5 CAL,) LIQD Give 6 cartons Osmolite 1.5 via tube split over 4 feedings/day. Flush tube with 60 ml water before and after each bolus feeding. Drink by mouth or give via tube additional 2.5 c  fluids/day. This provides 2130 kcal, 89 grams protein, 2159 ml total water. Meets 100% needs.  0   ondansetron (ZOFRAN) 8 MG tablet Take 1 tablet (8 mg total) by mouth 2 (two) times daily as needed. Start on the third day after cisplatin chemotherapy. 30 tablet 1   pilocarpine (SALAGEN) 5 MG tablet Take 1 tablet by mouth in the morning, at noon, and at bedtime.     simvastatin (ZOCOR) 20 MG tablet Take 20 mg by mouth daily.     tamsulosin (FLOMAX) 0.4 MG CAPS capsule Take 0.8 mg by mouth daily.     zinc gluconate 50 MG tablet Take 50 mg by mouth daily.     CISPLATIN IV Inject 40 mg/m2 into the vein once a week. (Patient not taking: Reported on 04/19/2021)     lidocaine-prilocaine (EMLA) cream Apply to affected area once (Patient not taking: Reported on 04/19/2021) 30 g 3   prochlorperazine (COMPAZINE) 10 MG tablet Take 1 tablet (10 mg total) by mouth every 6 (six) hours as needed (Nausea or vomiting). (Patient not taking: Reported on 04/19/2021) 30 tablet 1   No current facility-administered medications for this visit.    ALLERGIES:  No Known Allergies  PHYSICAL EXAM:  Performance status (ECOG): 0 - Asymptomatic  Vitals:   04/19/21 1301  BP: (!) 155/79  Pulse: 70  Resp: 18  Temp: (!) 97 F (36.1 C)  SpO2: 100%   Wt Readings from Last 3 Encounters:  04/19/21 143 lb 12.8 oz (65.2 kg)  03/14/21 140 lb 8 oz (63.7 kg)  02/19/21 142 lb 9.6 oz (64.7 kg)    Physical Exam Vitals reviewed.  Constitutional:      Appearance: Normal appearance.  HENT:     Mouth/Throat:     Mouth: Mucous membranes are moist. No oral lesions.  Cardiovascular:     Rate and Rhythm: Normal rate and regular rhythm.     Pulses: Normal pulses.     Heart sounds: Normal heart sounds.  Pulmonary:     Effort: Pulmonary effort is normal.     Breath sounds: Normal breath sounds.  Musculoskeletal:     Cervical back: Neck supple.  Neurological:     General: No focal deficit present.     Mental Status: He is alert and oriented to person, place, and time.  Psychiatric:        Mood and Affect: Mood normal.        Behavior: Behavior normal.     LABORATORY DATA:  I have reviewed the labs as listed.  CBC Latest Ref Rng & Units 03/14/2021 02/19/2021 02/13/2021  WBC 4.0 - 10.5 K/uL 4.6 5.9 6.1  Hemoglobin 13.0 - 17.0 g/dL 11.5(L) 11.8(L) 11.6(L)  Hematocrit 39.0 - 52.0 % 33.2(L) 35.1(L) 34.3(L)  Platelets 150 - 400 K/uL 268 193 160   CMP Latest Ref Rng & Units 03/14/2021 02/19/2021 02/13/2021  Glucose 70 - 99 mg/dL 105(H) 94 119(H)  BUN 8 - 23 mg/dL 27(H) 27(H) 33(H)  Creatinine 0.61 - 1.24 mg/dL 0.97 0.86 0.95  Sodium 135 - 145 mmol/L 137 133(L) 134(L)  Potassium 3.5 - 5.1 mmol/L 4.9 4.6 4.3  Chloride 98 - 111 mmol/L 98 99 98  CO2 22 - 32 mmol/L 29 27 29   Calcium 8.9 - 10.3 mg/dL 9.4 9.0 8.7(L)  Total Protein 6.5 - 8.1 g/dL 7.0 7.0 6.7  Total Bilirubin 0.3 - 1.2 mg/dL 0.4 0.5 0.7  Alkaline Phos 38 - 126 U/L 99 94 87  AST 15 - 41  U/L 20 18 19   ALT 0 - 44 U/L 18 18 19     DIAGNOSTIC IMAGING:  I have independently reviewed the scans and discussed with the patient. NM PET Image Restag (PS) Skull Base To Thigh  Result Date: 04/13/2021 CLINICAL DATA:  Subsequent treatment strategy for restaging of tonsillar cancer. Biopsied in July. EXAM: NUCLEAR MEDICINE PET SKULL BASE TO THIGH TECHNIQUE: 7.7 mCi F-18 FDG was injected intravenously. Full-ring PET imaging was  performed from the skull base to thigh after the radiotracer. CT data was obtained and used for attenuation correction and anatomic localization. Fasting blood glucose: 103 mg/dl COMPARISON:  12/07/2020 FINDINGS: Mediastinal blood pool activity: SUV max 2.1 Liver activity: SUV max NA NECK: Left palatine tonsil hypermetabolism is significantly improved to resolved. For example, vague residual hypermetabolism measures a S.U.V. max of 3.1 on 72/3. Compare a S.U.V. max of 16.1. No well-defined residual mass in this area. No residual cervical nodal hypermetabolism. The previous 8 mm node is either markedly decreased or resolved. Incidental CT findings: Left carotid atherosclerosis. Cerebral atrophy. CHEST: No thoracic nodal hypermetabolism. The previously described right lower lobe hypermetabolic ground-glass nodule has resolved. The right upper lobe subpleural mixed attenuation nodule is also less distinct today. Equivocally persistent on 112/3. The 6 mm right middle lobe nodule is no longer identified. Incidental CT findings: Emphysema. Right Port-A-Cath tip low SVC. Aortic and coronary artery calcification. ABDOMEN/PELVIS: No abdominopelvic parenchymal or nodal hypermetabolism. Incidental CT findings: Normal adrenal glands. Hyperattenuation along the lateral left kidney may represent a site of prior surgery or dystrophic calcification. Gastrostomy tube is appropriately positioned. Prior ventral abdominopelvic wall hernia repair. Appendicoliths without surrounding inflammation. SKELETON: No abnormal marrow activity. Incidental CT findings: Right scapular fixation. Right glenohumeral joint arthroplasty. IMPRESSION: 1. Near complete to complete metabolic resolution of left palatine primary. No evidence of residual cervical nodal metastasis. 2. No new or progressive disease. 3. The right lower lobe ground-glass nodule has resolved. The right upper lobe mixed attenuation nodule has nearly completely to completely resolved.  The right middle lobe 6 mm pulmonary nodule is not readily apparent. 4. Incidental findings, including: Aortic atherosclerosis (ICD10-I70.0), coronary artery atherosclerosis and emphysema (ICD10-J43.9). Electronically Signed   By: Abigail Miyamoto M.D.   On: 04/13/2021 13:54     ASSESSMENT:  1.  Left tonsillar squamous cell cancer: - Left tonsillar biopsy by Dr. Benjamine Mola on 11/15/2020 consistent with squamous cell carcinoma.  P16 is strongly positive. - PET scan on 12/07/2020-shows 4.1 cm left palatine tonsil mass with SUV 16.  0.8 cm left level 3 lymph node SUV 4.4.  Elongated 0.9 x 1 cm right lower lobe nodule along the azygoesophageal recess has accentuated metabolic activity with SUV 5.8.  This has some solid component and was not visible on chest CT of 04/24/2020.  Differential includes unusual morphology of metastatic disease versus inflammatory process.  Groundglass opacity laterally in the right upper lobe likely inflammatory with SUV 1.4.  0.6 cm right middle lobe nodule not hypermetabolic. - Concurrent chemoradiation therapy from 01/09/2021 through 02/26/2021 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  Hearing on the left side is slightly worse due to tumor.  2.  Social/family history: - He has a retired after working for [employer].  He quit smoking 12 years ago.  He smoked 2 packs/day for 20 years. - Sister had lung cancer.  2 sisters died of lung cancer.  Brother had lung cancer.   PLAN:  1.  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 - We have reviewed PET scan images from 04/13/2021 which showed complete metabolic resolution of the left palatine primary with no evidence of  lymph node metastasis.  Right upper lobe extenuation nodule completely resolved.  Right middle lobe 6 mm pulmonary nodule is not readily apparent. - Physical examination today did not reveal any tonsillar masses or  neck adenopathy. - Recommend follow-up in 6 months with repeat CT soft tissue neck.  We will also check TSH level along with routine labs.  2.  Left ear and headache: - He no longer has pain in the left ear or headache.  3.  Nutrition: - He has not used his feeding tube in the last 1 month. - He is eating by mouth and is able to eat all sorts of foods.  He has gained 3 pounds in the last 4 to 6 weeks. - Recommend discontinuation of feeding tube by IR.  4.  Peripheral neuropathy: - He has occasional left hip tingling when he is sleeping along with burning sensation. - Continue gabapentin 300 mg at bedtime as needed.   Orders placed this encounter:  No orders of the defined types were placed in this encounter.    Derek Daulton, MD [place] [phone]   I, Thana Ates, am acting as a scribe for Dr. Derek Airrion.  I, Derek Drayven MD, have reviewed the above documentation for accuracy and completeness, and I agree with the above.

## 2021-04-19 ENCOUNTER — Other Ambulatory Visit: Payer: Self-pay

## 2021-04-19 ENCOUNTER — Inpatient Hospital Stay (HOSPITAL_COMMUNITY): Payer: Medicare HMO | Attending: Hematology | Admitting: Hematology

## 2021-04-19 ENCOUNTER — Inpatient Hospital Stay (HOSPITAL_COMMUNITY): Payer: Medicare HMO | Admitting: Dietician

## 2021-04-19 VITALS — BP 155/79 | HR 70 | Temp 97.0°F | Resp 18 | Ht 67.0 in | Wt 143.8 lb

## 2021-04-19 DIAGNOSIS — Z85528 Personal history of other malignant neoplasm of kidney: Secondary | ICD-10-CM

## 2021-04-19 DIAGNOSIS — C099 Malignant neoplasm of tonsil, unspecified: Secondary | ICD-10-CM

## 2021-04-19 DIAGNOSIS — Z79899 Other long term (current) drug therapy: Secondary | ICD-10-CM | POA: Diagnosis not present

## 2021-04-19 DIAGNOSIS — Z96611 Presence of right artificial shoulder joint: Secondary | ICD-10-CM | POA: Diagnosis not present

## 2021-04-19 DIAGNOSIS — Z905 Acquired absence of kidney: Secondary | ICD-10-CM | POA: Insufficient documentation

## 2021-04-19 DIAGNOSIS — M25552 Pain in left hip: Secondary | ICD-10-CM | POA: Diagnosis not present

## 2021-04-19 DIAGNOSIS — I10 Essential (primary) hypertension: Secondary | ICD-10-CM | POA: Diagnosis not present

## 2021-04-19 DIAGNOSIS — Z85828 Personal history of other malignant neoplasm of skin: Secondary | ICD-10-CM | POA: Diagnosis not present

## 2021-04-19 DIAGNOSIS — Z87891 Personal history of nicotine dependence: Secondary | ICD-10-CM | POA: Insufficient documentation

## 2021-04-19 NOTE — Progress Notes (Signed)
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   Met with patient in clinic. He reports doing "great" Patient has a good appetite, eating a variety of foods by mouth. Patient continues drinking Osmolite shakes mixed with CIB for added calories and protein. He denies swallowing difficulty, reports mild throat irritation with acidic foods. Patient continues to have dry mouth. He was prescribed salagen for this but has not worked for him besides causing increased perspiration. Patient is drinking a lot of water. Patient reports taste is improving. He is looking forward to going back to work after the first of the year. Patient is ready to have PEG removed, reports it is sore and gets in the way. He denies nausea, vomiting, diarrhea, constipation.    Medications: reviewed   Labs: 11/9 labs reviewed   Anthropometrics: Weight 143 lb 12.8 oz today increased from 140 lb 8 oz on 11/9  10/17 - 142 lb 9.6 oz 10/11 -  143 lb 3.2 oz 10/4 - 144 lb 1 oz     NUTRITION DIAGNOSIS: Inadequate oral intake resolved    INTERVENTION:  Continue eating high calorie, high protein foods for weight maintenance  Continue drinking CIB or Osmolite Shake daily for added calories and protein  Patient has weaned from tube feeds, maintaining weights with oral intake, recommend removing PEG at this time (MD seeing pt today to discuss) Encouraged continued HEP exercises as recommended per SLP Encouraged activity as able Patient has contact information    MONITORING, EVALUATION, GOAL: weight trends, intake    NEXT VISIT: No follow-up scheduled at this time, pt has contact information and encouraged to contact with nutrition questions/concerns

## 2021-04-19 NOTE — Patient Instructions (Signed)
[place] at [facility] Discharge Instructions   You were seen and examined today by Dr. Delton Coombes.  He reviewed your PET scan which shows almost complete to complete response.   Follow up with Dr. Lynnette Caffey as planned.  We will see you back in 6 months with a CT of your neck prior.    Thank you for choosing [place] at [facility] to provide your oncology and hematology care.  To afford each patient quality time with our provider, please arrive at least 15 minutes before your scheduled appointment time.   If you have a lab appointment with the [place] please come in thru the Main Entrance and check in at the main information desk.  You need to re-schedule your appointment should you arrive 10 or more minutes late.  We strive to give you quality time with our providers, and arriving late affects you and other patients whose appointments are after yours.  Also, if you no show three or more times for appointments you may be dismissed from the clinic at the providers discretion.     Again, thank you for choosing [facility].  Our hope is that these requests will decrease the amount of time that you wait before being seen by our physicians.       _____________________________________________________________  Should you have questions after your visit to [facility], please contact our office at [phone] and follow the prompts.  Our office hours are 8:00 a.m. and 4:30 p.m. Monday - Friday.  Please note that voicemails left after 4:00 p.m. may not be returned until the following business day.  We are closed weekends and major holidays.  You do have access to a nurse 24-7, just call the main number to the clinic [phone] and do not press any options, hold on the line and a nurse will answer the phone.    For prescription refill requests, have your pharmacy contact our office and allow 72 hours.    Due to  Covid, you will need to wear a mask upon entering the hospital. If you do not have a mask, a mask will be given to you at the Main Entrance upon arrival. For doctor visits, patients may have 1 support person age 22 or older with them. For treatment visits, patients can not have anyone with them due to social distancing guidelines and our immunocompromised population.

## 2021-04-25 ENCOUNTER — Ambulatory Visit (HOSPITAL_COMMUNITY)
Admission: RE | Admit: 2021-04-25 | Discharge: 2021-04-25 | Disposition: A | Discharge status: Home / Self Care | Payer: Medicare HMO | Source: Ambulatory Visit | Attending: Hematology | Admitting: Hematology

## 2021-04-25 ENCOUNTER — Other Ambulatory Visit: Payer: Self-pay

## 2021-04-25 DIAGNOSIS — C099 Malignant neoplasm of tonsil, unspecified: Secondary | ICD-10-CM | POA: Diagnosis not present

## 2021-04-25 DIAGNOSIS — Z431 Encounter for attention to gastrostomy: Secondary | ICD-10-CM | POA: Diagnosis not present

## 2021-04-25 HISTORY — PX: IR GASTROSTOMY TUBE REMOVAL: IMG5492

## 2021-04-25 MED ORDER — LIDOCAINE VISCOUS HCL 2 % MT SOLN
15.0000 mL | Freq: Once | OROMUCOSAL | Status: DC
  Filled 2021-04-25: qty 15

## 2021-04-25 MED ORDER — LIDOCAINE VISCOUS HCL 2 % MT SOLN
OROMUCOSAL | Status: DC | PRN
  Administered 2021-04-25: 15 mL via OROMUCOSAL

## 2021-04-25 NOTE — Procedures (Signed)
Per order of Dr. Delton Coombes, patient's 1 French pull-through gastrostomy tube was removed in its entirety without immediate complications.  Medication used-viscous lidocaine into gastrostomy tube insertion site tract.  Gauze dressing applied over site.  Site care instructions reviewed with patient. EBL< 5 cc.

## 2021-05-01 ENCOUNTER — Encounter (HOSPITAL_COMMUNITY): Payer: Self-pay | Admitting: *Deleted

## 2021-05-01 NOTE — Progress Notes (Signed)
Return to work documents without restrictions has been sent to [employer], patient's employer, per his request.  Scanned to Epic for reference.

## 2021-05-11 ENCOUNTER — Other Ambulatory Visit: Payer: Self-pay

## 2021-05-11 ENCOUNTER — Encounter: Payer: Self-pay | Admitting: Physical Therapy

## 2021-05-11 ENCOUNTER — Ambulatory Visit: Payer: Medicare HMO | Attending: Radiation Oncology | Admitting: Physical Therapy

## 2021-05-11 DIAGNOSIS — R293 Abnormal posture: Secondary | ICD-10-CM | POA: Insufficient documentation

## 2021-05-11 DIAGNOSIS — I890 Lymphedema, not elsewhere classified: Secondary | ICD-10-CM | POA: Diagnosis not present

## 2021-05-11 DIAGNOSIS — D490 Neoplasm of unspecified behavior of digestive system: Secondary | ICD-10-CM | POA: Insufficient documentation

## 2021-05-11 NOTE — Therapy (Signed)
[place] @ [place], [place], [address] Phone: [phone]   Fax:  [phone]  Physical Therapy Treatment  Patient Details  Name: Douglas [name] MRN: [id number] Date of Birth: 1945/06/05 Referring Provider (PT): Dr Alvy Bimler   Encounter Date: 05/11/2021   PT End of Session - 05/11/21 0950     Visit Number 2    Number of Visits 10    Date for PT Re-Evaluation 06/08/21    PT Start Time 0909    PT Stop Time 0948    PT Time Calculation (min) 39 min    Activity Tolerance Patient tolerated treatment well    Behavior During Therapy [facility] for tasks assessed/performed             Past Medical History:  Diagnosis Date   Arthritis    Cancer ([place])    Skin, and Kidney   Hypertension    Pre-diabetes    Skin cancer     Past Surgical History:  Procedure Laterality Date   COLONOSCOPY     HERNIA REPAIR     IR GASTROSTOMY TUBE MOD SED  12/11/2020   IR GASTROSTOMY TUBE REMOVAL  04/25/2021   IR IMAGING GUIDED PORT INSERTION  12/11/2020   LAPAROSCOPIC PARTIAL NEPHRECTOMY Left    PARTIAL NEPHRECTOMY Left 2009   REVERSE SHOULDER ARTHROPLASTY Right 09/10/2019   Procedure: REVERSE SHOULDER ARTHROPLASTY;  Surgeon: [name], MD;  Location: [facility];  Service: Orthopedics;  Laterality: Right;  interscalene block   SKIN SURGERY      There were no vitals filed for this visit.   Subjective Assessment - 05/11/21 0910     Subjective I started noticing swelling in my neck about a month ago. I got on the lymphedema website and I have been trying to massage it. I have tightness in the muscles of my neck.    Pertinent History Past history of right total shoulder replacement May 2020 and developed a displaced acromion stress shoulder  so had to have another shoulder surgery.  History of kidney cancer in 2009 and had a partial nephrectomy. 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 No/denies    Pain Score 0-No pain                [facility] PT Assessment - 05/11/21 0001       Assessment   Medical Diagnosis tonsil cancer    Referring Provider (PT) Dr Alvy Bimler    Onset Date/Surgical Date 11/29/20    Hand Dominance Right;Left    Prior Therapy baselines in 8/22      Precautions   Precautions Shoulder;Other (comment)    Type of Shoulder Precautions R total shoulder in 2021      Restrictions   Weight Bearing Restrictions No      Balance Screen   Has the patient fallen in the past 6 months No    Has the patient had a decrease in activity level because of a fear of falling?  No    Is the patient reluctant to leave their home because of a fear of falling?  No      Home Environment   Living Environment Private residence    Living Arrangements Children   son   Available Help at Discharge Family  Prior Function   Level of Independence Independent    Vocation Part time employment   plans to return to work soon   [employer] works part time in the [place] tries to exercise daily: walking, stretching      Cognition   Overall Cognitive Status Within Functional Limits for tasks assessed      Observation/Other Assessments   Observations pt has visible swelling at anterior neck      Functional Tests   Functional tests Sit to Stand      Sit to Stand   Comments 30 sec sit to stand: 17 reps      Posture/Postural Control   Posture/Postural Control Postural limitations    Postural Limitations Rounded Shoulders;Forward head      AROM   Overall AROM  Within functional limits for tasks performed    Cervical Flexion WFL    Cervical Extension 75% limited   pt reports it has been this way for years due to cervical issues   Cervical - Right Side Bend 25% limited    Cervical - Left Side Bend 50% limited    Cervical - Right  Rotation 50% limited    Cervical - Left Rotation 25% limited               LYMPHEDEMA/ONCOLOGY QUESTIONNAIRE - 05/11/21 0001       Head and Neck   4 cm superior to sternal notch around neck 38.4 cm    6 cm superior to sternal notch around neck 39.3 cm    8 cm superior to sternal notch around neck 40.6 cm                        [facility] Adult PT Treatment/Exercise - 05/11/21 0001       Manual Therapy   Manual Therapy Manual Lymphatic Drainage (MLD);Edema management    Edema Management created foam chip pack for pt to wear around neck to decrease edema    Manual Lymphatic Drainage (MLD) Began instructing pt in anatomy and physiology of the lymphatic system as well as proper skin stretch technique - in supine: short neck, 5 diaphragmatic breaths, bilateral axillary nodes, bilateral pectoral nodes, bilateral chest, short neck, posterior, lateral and anterior neck then retracing all steps                          PT Long Term Goals - 05/11/21 0955       PT LONG TERM GOAL #1   Title Pt will be independent in self MLD for long term management of lymphedema.    Time 4    Period Weeks    Status New    Target Date 06/08/21      PT LONG TERM GOAL #2   Title Pt will obtain appropriate compression garment for long term management of lymphedema.    Time 4    Period Weeks    Status New    Target Date 06/08/21      PT LONG TERM GOAL #3   Title Pt will report a 50% decrease in edema at anterior neck to decrease risk of infection.    Time 4    Period Weeks    Status New                   Plan - 05/11/21 0951     Clinical Impression Statement Pt returns to PT with  recent onset of lymphedema in anterior neck that began about a month ago. He completed chemo and radiation for tonsillar cancer in Oct 2022. His swelling is still very soft with no fibrosis palpable today. He has limited cervical ROM but reports it has been this way for a long time  due to cervical issues. Pt looked up online how to do self massage and has been attempting this at home. Created a chip pack for pt to wear at home to help decrease swelling. Began instructing pt in self MLD today and educated pt about anatomy and physiology of lymphatic system and how to properly stretch skin with self MLD. Pt would benefit from skilled PT services to decrease lymphedema, assist pt with obtain appropriate compression garment, decrease neck tightness, and progress pt towards independent management.    Personal Factors and Comorbidities Comorbidity 3+    Comorbidities past right shoulder surgery, neck degerative arthritis, tonsil cancer    PT Frequency 2x / week    PT Duration 4 weeks    PT Treatment/Interventions ADLs/Self Care Home Management;Therapeutic exercise;Patient/family education;Manual techniques;Manual lymph drainage;Compression bandaging;Orthotic Fit/Training;Taping;Vasopneumatic Device    PT Next Visit Plan issue [place] anterior approach handout and instruct pt in self MLD, do STM to anterior neck in area of tightness, how was chip pack?    PT Home Exercise Plan wear chip pack    Consulted and Agree with Plan of Care Patient             Patient will benefit from skilled therapeutic intervention in order to improve the following deficits and impairments:  Decreased knowledge of precautions, Decreased knowledge of use of DME, Decreased range of motion, Postural dysfunction, Increased edema, Increased fascial restricitons  Visit Diagnosis: Lymphedema, not elsewhere classified  Abnormal posture  Tonsil neoplasm     Problem List Patient Active Problem List   Diagnosis Date Noted   Tonsil cancer ([place]) 11/29/2020   History of kidney cancer 11/29/2020   Weight loss 11/29/2020   History of skin cancer 11/29/2020   Hearing loss 11/29/2020   S/P shoulder replacement, 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

## 2021-05-15 ENCOUNTER — Ambulatory Visit: Payer: Medicare HMO | Admitting: Physical Therapy

## 2021-05-17 ENCOUNTER — Encounter: Payer: Medicare HMO | Admitting: Physical Therapy

## 2021-05-24 ENCOUNTER — Other Ambulatory Visit: Payer: Self-pay

## 2021-05-24 ENCOUNTER — Encounter: Payer: Self-pay | Admitting: Physical Therapy

## 2021-05-24 ENCOUNTER — Ambulatory Visit: Payer: Medicare HMO | Admitting: Physical Therapy

## 2021-05-24 DIAGNOSIS — R293 Abnormal posture: Secondary | ICD-10-CM | POA: Diagnosis not present

## 2021-05-24 DIAGNOSIS — D490 Neoplasm of unspecified behavior of digestive system: Secondary | ICD-10-CM | POA: Diagnosis not present

## 2021-05-24 DIAGNOSIS — I890 Lymphedema, not elsewhere classified: Secondary | ICD-10-CM | POA: Diagnosis not present

## 2021-05-24 NOTE — Therapy (Signed)
[place] @ [place], [place], [address] Phone: [phone]   Fax:  [phone]  Physical Therapy Treatment  Patient Details  Name: Douglas [name] MRN: [id number] Date of Birth: Dec 18, 1945 Referring Provider (PT): Dr Alvy Bimler   Encounter Date: 05/24/2021   PT End of Session - 05/24/21 1115     Visit Number 3    Number of Visits 10    Date for PT Re-Evaluation 06/08/21    PT Start Time 1005    PT Stop Time 1100    PT Time Calculation (min) 55 min    Activity Tolerance Patient tolerated treatment well             Past Medical History:  Diagnosis Date   Arthritis    Cancer ([place])    Skin, and Kidney   Hypertension    Pre-diabetes    Skin cancer     Past Surgical History:  Procedure Laterality Date   COLONOSCOPY     HERNIA REPAIR     IR GASTROSTOMY TUBE MOD SED  12/11/2020   IR GASTROSTOMY TUBE REMOVAL  04/25/2021   IR IMAGING GUIDED PORT INSERTION  12/11/2020   LAPAROSCOPIC PARTIAL NEPHRECTOMY Left    PARTIAL NEPHRECTOMY Left 2009   REVERSE SHOULDER ARTHROPLASTY Right 09/10/2019   Procedure: REVERSE SHOULDER ARTHROPLASTY;  Surgeon: [name], MD;  Location: [facility];  Service: Orthopedics;  Laterality: Right;  interscalene block   SKIN SURGERY      There were no vitals filed for this visit.   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  He feels that he is doing well.  He is doing the self massage and and wearing the chin strap. He does alot of walking and is up to a mile that has some hills.    Pertinent History Past history of right total shoulder replacement May 2020 and developed a displaced acromion stress shoulder  so had to have another shoulder surgery.  History of kidney cancer in 2009 and had a partial nephrectomy. 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 No/denies                               [facility] Adult PT Treatment/Exercise - 05/24/21 0001       Self-Care   Self-Care Other Self-Care Comments    Other Self-Care Comments  encouraged pt to continue walking and UE and neck exercise      Manual Therapy   Manual Therapy Manual Lymphatic Drainage (MLD);Edema management    Edema Management gave pt information about how to get a size medium marena garment from [employer] and to bring it in with him next visit if he chooses to get one    Manual Lymphatic Drainage (MLD) Provided [employer] and performed in sitting with insturction:  5 diaphragmatic breaths, bilateral axillary nodes, bilateral pectoral nodes, bilateral chest, short neck, posterior, lateral and anterior neck then retracing all steps Pt was able to repeat demonstration                     PT Education - 05/24/21 1114  Education Details self MLD for head and neck, suggestion for compression garment from [employer]) Educated Patient    Methods Explanation;Demonstration;Tactile cues;Verbal cues;Handout    Comprehension Verbalized understanding;Returned demonstration                 PT Long Term Goals - 05/11/21 0955       PT LONG TERM GOAL #1   Title Pt will be independent in self MLD for long term management of lymphedema.    Time 4    Period Weeks    Status New    Target Date 06/08/21      PT LONG TERM GOAL #2   Title Pt will obtain appropriate compression garment for long term management of lymphedema.    Time 4    Period Weeks    Status New    Target Date 06/08/21      PT LONG TERM GOAL #3   Title Pt will report a 50% decrease in edema at anterior neck to decrease risk of infection.    Time 4    Period Weeks    Status New                   Plan - 05/24/21 1115     Clinical  Impression Statement Pt is doing well with his self care at home. He says that he notices the swelling most when he first gets up in the morning, but then he does his self MLD and wears his chip pack and it improves.  Self technique was reinforced today with handout and suggestion for [place]. His skin is soft and moveable with only mild fullness in left neck    Personal Factors and Comorbidities Comorbidity 3+    Comorbidities past right shoulder surgery, neck degerative arthritis, tonsil cancer    Stability/Clinical Decision Making Stable/Uncomplicated    Rehab Potential Good    PT Frequency 2x / week    PT Duration 4 weeks    PT Treatment/Interventions ADLs/Self Care Home Management;Therapeutic exercise;Patient/family education;Manual techniques;Manual lymph drainage;Compression bandaging;Orthotic Fit/Training;Taping;Vasopneumatic Device    PT Next Visit Plan continue  instruct pt in self MLD, do STM to anterior neck in area of tightness, how was chip pack?will he get marena?    Consulted and Agree with Plan of Care Patient             Patient will benefit from skilled therapeutic intervention in order to improve the following deficits and impairments:  Decreased knowledge of precautions, Decreased knowledge of use of DME, Decreased range of motion, Postural dysfunction, Increased edema, Increased fascial restricitons  Visit Diagnosis: Lymphedema, not elsewhere classified  Abnormal posture     Problem List Patient Active Problem List   Diagnosis Date Noted   Tonsil cancer ([place]) 11/29/2020   History of kidney cancer 11/29/2020   Weight loss 11/29/2020   History of skin cancer 11/29/2020   Hearing loss 11/29/2020   S/P shoulder replacement, 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 PT  Norwood Levo, PT 05/24/2021, 11:19 AM  [place] @ [place], [place], [address] Phone: [phone]   Fax:   [phone]  Name: Douglas [name] MRN: [id number] Date of Birth: 08-07-1945

## 2021-05-29 ENCOUNTER — Other Ambulatory Visit: Payer: Self-pay

## 2021-05-29 ENCOUNTER — Ambulatory Visit: Payer: Medicare HMO

## 2021-05-29 DIAGNOSIS — R293 Abnormal posture: Secondary | ICD-10-CM

## 2021-05-29 DIAGNOSIS — I890 Lymphedema, not elsewhere classified: Secondary | ICD-10-CM | POA: Diagnosis not present

## 2021-05-29 DIAGNOSIS — D490 Neoplasm of unspecified behavior of digestive system: Secondary | ICD-10-CM | POA: Diagnosis not present

## 2021-05-29 NOTE — Therapy (Addendum)
[place] @ [place], [place], [address] Phone: [phone]   Fax:  [phone]  Physical Therapy Treatment  Patient Details  Name: Douglas [name] MRN: [id number] Date of Birth: 1945-09-04 Referring Provider (PT): Dr Alvy Bimler   Encounter Date: 05/29/2021   PT End of Session - 05/29/21 1101     Visit Number 4    Number of Visits 10    Date for PT Re-Evaluation 06/08/21    PT Start Time 1004    PT Stop Time 1059    PT Time Calculation (min) 55 min    Activity Tolerance Patient tolerated treatment well    Behavior During Therapy [facility] for tasks assessed/performed             Past Medical History:  Diagnosis Date   Arthritis    Cancer ([place])    Skin, and Kidney   Hypertension    Pre-diabetes    Skin cancer     Past Surgical History:  Procedure Laterality Date   COLONOSCOPY     HERNIA REPAIR     IR GASTROSTOMY TUBE MOD SED  12/11/2020   IR GASTROSTOMY TUBE REMOVAL  04/25/2021   IR IMAGING GUIDED PORT INSERTION  12/11/2020   LAPAROSCOPIC PARTIAL NEPHRECTOMY Left    PARTIAL NEPHRECTOMY Left 2009   REVERSE SHOULDER ARTHROPLASTY Right 09/10/2019   Procedure: REVERSE SHOULDER ARTHROPLASTY;  Surgeon: [name], MD;  Location: [facility];  Service: Orthopedics;  Laterality: Right;  interscalene block   SKIN SURGERY      There were no vitals filed for this visit.   Subjective Assessment - 05/29/21 1007     Subjective I was able to work 5 hrs Sat and 4 hrs Sun and I felt pretty good. I've been continuing with the self MLD and wearing the chip pack. I ordered the [place] and it should be arriving it the next few days.    Pertinent History Past history of right total shoulder replacement May 2020 and developed a displaced acromion stress shoulder  so had to have another shoulder surgery.  History of kidney cancer in 2009 and had a partial nephrectomy. Pt has sever cervical joint degeneration. Diagnosed with  tonsillar cancer in 11/2020 pt has completed radiation and chemotherapy in Oct 2022    Patient Stated Goals to get the swelling down    Currently in Pain? No/denies                               [facility] Adult PT Treatment/Exercise - 05/29/21 0001       Self-Care   Other Self-Care Comments  Discussed pts current functional status and how he's doing with self care/management at this time      Manual Therapy   Manual Lymphatic Drainage (MLD) In Supine with HOB elevated: Long neck, 5 diaphragmatic breaths, bilateral axillary nodes, bilateral pectoral nodes, bilateral chest, short neck, posterior, lateral and anterior neck then retracing all                          PT Long Term Goals - 05/11/21 0955       PT LONG TERM GOAL #1   Title Pt will be independent in self MLD for long term management of lymphedema.    Time 4    Period Weeks    Status New    Target Date 06/08/21  PT LONG TERM GOAL #2   Title Pt will obtain appropriate compression garment for long term management of lymphedema.    Time 4    Period Weeks    Status New    Target Date 06/08/21      PT LONG TERM GOAL #3   Title Pt will report a 50% decrease in edema at anterior neck to decrease risk of infection.    Time 4    Period Weeks    Status New                   Plan - 05/29/21 1101     Clinical Impression Statement Continued with manual lymph draianage to anterior throat. Pt is doing well self managing at this time and has ordered his marena compression garment. He is performing his self MLD daily and wearing the chip pack until his compression garment arrives. Advised him to bring his garment when it arrives so we can assess fit and see if he could benefit from compression foam being added to anterior throat area. Pt verbalized good understanding.    Personal Factors and Comorbidities Comorbidity 3+    Comorbidities past right shoulder surgery, neck degerative  arthritis, tonsil cancer    Stability/Clinical Decision Making Stable/Uncomplicated    Rehab Potential Good    PT Frequency 2x / week    PT Duration 4 weeks    PT Treatment/Interventions ADLs/Self Care Home Management;Therapeutic exercise;Patient/family education;Manual techniques;Manual lymph drainage;Compression bandaging;Orthotic Fit/Training;Taping;Vasopneumatic Device    PT Next Visit Plan continue MLD and review same prn with pt, do STM to anterior neck in area of tightness, assess Marena when he brings it, add compression foam for it? Probable D/C soon as pt is doing well    PT Home Exercise Plan wear chip pack and self MLD daily    Consulted and Agree with Plan of Care Patient             Patient will benefit from skilled therapeutic intervention in order to improve the following deficits and impairments:  Decreased knowledge of precautions, Decreased knowledge of use of DME, Decreased range of motion, Postural dysfunction, Increased edema, Increased fascial restricitons  Visit Diagnosis: Lymphedema, not elsewhere classified  Abnormal posture  Tonsil neoplasm     Problem List Patient Active Problem List   Diagnosis Date Noted   Tonsil cancer ([place]) 11/29/2020   History of kidney cancer 11/29/2020   Weight loss 11/29/2020   History of skin cancer 11/29/2020   Hearing loss 11/29/2020   S/P shoulder replacement, 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 Patient  goals were not met. Patient is being discharged due to not returning since the last visit.  Allyson Sabal [place], [place] 03/11/22 2:18 PM

## 2021-05-31 ENCOUNTER — Encounter: Payer: Medicare HMO | Admitting: Physical Therapy

## 2021-06-05 ENCOUNTER — Ambulatory Visit: Payer: Medicare HMO | Admitting: Physical Therapy

## 2021-06-05 DIAGNOSIS — R42 Dizziness and giddiness: Secondary | ICD-10-CM | POA: Diagnosis not present

## 2021-06-05 DIAGNOSIS — H539 Unspecified visual disturbance: Secondary | ICD-10-CM | POA: Diagnosis not present

## 2021-06-05 DIAGNOSIS — E785 Hyperlipidemia, unspecified: Secondary | ICD-10-CM | POA: Diagnosis not present

## 2021-06-05 DIAGNOSIS — I890 Lymphedema, not elsewhere classified: Secondary | ICD-10-CM | POA: Diagnosis not present

## 2021-06-05 DIAGNOSIS — I951 Orthostatic hypotension: Secondary | ICD-10-CM | POA: Diagnosis not present

## 2021-06-05 DIAGNOSIS — J439 Emphysema, unspecified: Secondary | ICD-10-CM | POA: Diagnosis not present

## 2021-06-05 DIAGNOSIS — Z79899 Other long term (current) drug therapy: Secondary | ICD-10-CM | POA: Diagnosis not present

## 2021-06-05 DIAGNOSIS — I10 Essential (primary) hypertension: Secondary | ICD-10-CM | POA: Diagnosis not present

## 2021-06-05 DIAGNOSIS — C099 Malignant neoplasm of tonsil, unspecified: Secondary | ICD-10-CM | POA: Diagnosis not present

## 2021-06-07 ENCOUNTER — Encounter: Payer: Medicare HMO | Admitting: Physical Therapy

## 2021-06-12 DIAGNOSIS — R42 Dizziness and giddiness: Secondary | ICD-10-CM | POA: Diagnosis not present

## 2021-06-12 DIAGNOSIS — R9089 Other abnormal findings on diagnostic imaging of central nervous system: Secondary | ICD-10-CM | POA: Diagnosis not present

## 2021-06-12 DIAGNOSIS — H539 Unspecified visual disturbance: Secondary | ICD-10-CM | POA: Diagnosis not present

## 2021-06-12 DIAGNOSIS — R9082 White matter disease, unspecified: Secondary | ICD-10-CM | POA: Diagnosis not present

## 2021-06-20 ENCOUNTER — Other Ambulatory Visit: Payer: Self-pay

## 2021-06-20 ENCOUNTER — Inpatient Hospital Stay (HOSPITAL_COMMUNITY): Payer: Medicare HMO | Attending: Hematology and Oncology

## 2021-06-20 DIAGNOSIS — Z452 Encounter for adjustment and management of vascular access device: Secondary | ICD-10-CM | POA: Insufficient documentation

## 2021-06-20 DIAGNOSIS — Z85818 Personal history of malignant neoplasm of other sites of lip, oral cavity, and pharynx: Secondary | ICD-10-CM | POA: Diagnosis not present

## 2021-06-20 MED ORDER — HEPARIN SOD (PORK) LOCK FLUSH 100 UNIT/ML IV SOLN
500.0000 [IU] | Freq: Once | INTRAVENOUS | Status: AC
  Administered 2021-06-20: 500 [IU] via INTRAVENOUS

## 2021-06-20 MED ORDER — SODIUM CHLORIDE 0.9% FLUSH
10.0000 mL | Freq: Once | INTRAVENOUS | Status: AC
  Administered 2021-06-20: 10 mL via INTRAVENOUS

## 2021-06-20 NOTE — Progress Notes (Signed)
Patients port flushed without difficulty.  Good blood return noted with no bruising or swelling noted at site.  Band aid applied.  VSS with discharge and left in satisfactory condition with no s/s of distress noted.   

## 2021-06-29 NOTE — Progress Notes (Signed)
NEUROLOGY CONSULTATION NOTE  Douglas [name] MRN: [id number] DOB: [birth year]  Referring provider: Rubin Payor, FNP Primary care provider: [name], MD  Reason for consult:  dizziness  Assessment/Plan:   I suspect that these may be possible migraine with aura.  He has some autonomic symptoms (diarrhea, lightheadedness, fluctuating blood pressure).  Not sure if vagus nerve may have been affected during radiation to his neck.  Unlikely as these symptoms are episodic and infrequent.  I think MRI findings are incidental.    They are rather infrequent so we will defer starting any medication.  He will monitor.  If they become more frequent, he will contact me.   Subjective:  Douglas [name] is a 76 year old male with HTN, HLD, and history of SSC and tonsil cancer who presents for dizziness.  History supplemented by referring provider's note.  He began experiencing spells in January.  He will suddenly feel weakness in his neck and shoulders followed by paresthesias on his head with visual disturbance (cracked glass) in both eyes and lightheadedness. Maybe very slight pain in the left temple but no significant headache.  No associated nausea, vomiting, photophobia, phonophobia or unilateral numbness or weakness.  Symptoms last 45 to 60 minutes.  Afterwards he has diarrhea.  Notes blood pressure can fluctuate.  SBP has been from 90 to 170s.  They occur about twice a month.  No known triggers.  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  Denies history of migraines.  He has history of tonsillar cancer and underwent chemotherapy and radiation for several weeks beginning in the summer.       PAST MEDICAL HISTORY: Past Medical History:  Diagnosis Date    Arthritis    Cancer ([place])    Skin, and Kidney   Hypertension    Pre-diabetes    Skin cancer     PAST SURGICAL HISTORY: Past Surgical History:  Procedure Laterality Date   COLONOSCOPY     HERNIA REPAIR     IR GASTROSTOMY TUBE MOD SED  12/11/2020   IR GASTROSTOMY TUBE REMOVAL  04/25/2021   IR IMAGING GUIDED PORT INSERTION  12/11/2020   LAPAROSCOPIC PARTIAL NEPHRECTOMY Left    PARTIAL NEPHRECTOMY Left 2009   REVERSE SHOULDER ARTHROPLASTY Right 09/10/2019   Procedure: REVERSE SHOULDER ARTHROPLASTY;  Surgeon: [name], MD;  Location: [facility];  Service: Orthopedics;  Laterality: Right;  interscalene block   SKIN SURGERY      MEDICATIONS: Current Outpatient Medications on File Prior to Visit  Medication Sig Dispense Refill   acetaminophen-codeine (TYLENOL #3) 300-30 MG tablet      amLODipine (NORVASC) 10 MG tablet Take 10 mg by mouth daily.     atenolol (TENORMIN) 25 MG tablet Take 25 mg by mouth daily.     cholecalciferol (VITAMIN D3) 25 MCG (1000 UNIT) tablet Take 1,000 Units by mouth daily.     CISPLATIN IV Inject 40 mg/m2 into the vein once a week.     diclofenac (VOLTAREN) 75 MG EC tablet Take 75 mg by mouth 2 (two) times daily.     gabapentin (NEURONTIN) 300 MG capsule TAKE ONE CAPSULE BY MOUTH AT BEDTIME 30 capsule 6   HYDROcodone-acetaminophen (NORCO) 5-325 MG tablet Take 1 tablet by mouth every 8 (eight) hours as  needed for moderate pain. 30 tablet 0   lidocaine-prilocaine (EMLA) cream Apply to affected area once 30 g 3   lisinopril (ZESTRIL) 40 MG tablet Take 40 mg by mouth daily.     magic mouthwash w/lidocaine SOLN Take 5 mLs by mouth 4 (four) times daily as needed for mouth pain. 250 mL 3   Menaquinone-7 (VITAMIN K2 PO) Take 1 tablet by mouth daily.     Multiple Vitamins-Minerals (MULTIVITAMIN WITH MINERALS) tablet Take 1 tablet by mouth daily.     Nutritional Supplements (FEEDING SUPPLEMENT, OSMOLITE 1.5 CAL,) LIQD Give 6 cartons Osmolite 1.5 via tube split over 4  feedings/day. Flush tube with 60 ml water before and after each bolus feeding. Drink by mouth or give via tube additional 2.5 c fluids/day. This provides 2130 kcal, 89 grams protein, 2159 ml total water. Meets 100% needs.  0   ondansetron (ZOFRAN) 8 MG tablet Take 1 tablet (8 mg total) by mouth 2 (two) times daily as needed. Start on the third day after cisplatin chemotherapy. 30 tablet 1   pilocarpine (SALAGEN) 5 MG tablet Take 1 tablet by mouth in the morning, at noon, and at bedtime.     prochlorperazine (COMPAZINE) 10 MG tablet Take 1 tablet (10 mg total) by mouth every 6 (six) hours as needed (Nausea or vomiting). 30 tablet 1   simvastatin (ZOCOR) 20 MG tablet Take 20 mg by mouth daily.     tamsulosin (FLOMAX) 0.4 MG CAPS capsule Take 0.8 mg by mouth daily.     zinc gluconate 50 MG tablet Take 50 mg by mouth daily.     No current facility-administered medications on file prior to visit.    ALLERGIES: No Known Allergies  FAMILY HISTORY: Family History  Problem Relation Age of Onset   Diabetes Mother    Pulmonary embolism Father    Lung cancer Sister    Lung cancer Sister    Diabetes Brother    Lung cancer Brother     Objective:  Blood pressure 115/65, pulse 61, height 5\' 7"  (1.702 m), weight 144 lb (65.3 kg), SpO2 97 %. General: No acute distress.  Patient appears well-groomed.   Head:  Normocephalic/atraumatic Eyes:  fundi examined but not visualized Neck: supple, no paraspinal tenderness, full range of motion Back: No paraspinal tenderness Heart: regular rate and rhythm Lungs: Clear to auscultation bilaterally. Vascular: No carotid bruits. Neurological Exam: Mental status: alert and oriented to person, place, and time, recent and remote memory intact, fund of knowledge intact, attention and concentration intact, speech fluent and not dysarthric, language intact. Cranial nerves: CN I: not tested CN II: pupils equal, round and reactive to light, visual fields intact CN  III, IV, VI:  full range of motion, no nystagmus, no ptosis CN V: facial sensation intact. CN VII: upper and lower face symmetric CN VIII: hearing intact CN IX, X: gag intact, uvula midline CN XI: sternocleidomastoid and trapezius muscles intact CN XII: tongue midline Bulk & Tone: normal, no fasciculations. Motor:  muscle strength 5/5 throughout Sensation:  temperature and vibratory sensation intact. Deep Tendon Reflexes:  2+ throughout,  toes downgoing.   Finger to nose testing:  Without dysmetria.   Heel to shin:  Without dysmetria.   Gait:  Normal station and stride.  Romberg negative.    Thank you for allowing me to take part in the care of this patient.  Metta Clines, DO  CC: Rubin Payor, FNP

## 2021-07-02 ENCOUNTER — Encounter: Payer: Self-pay | Admitting: Neurology

## 2021-07-02 ENCOUNTER — Other Ambulatory Visit: Payer: Self-pay

## 2021-07-02 ENCOUNTER — Ambulatory Visit: Payer: Medicare HMO | Admitting: Neurology

## 2021-07-02 VITALS — BP 115/65 | HR 61 | Ht 67.0 in | Wt 144.0 lb

## 2021-07-02 DIAGNOSIS — G43109 Migraine with aura, not intractable, without status migrainosus: Secondary | ICD-10-CM | POA: Diagnosis not present

## 2021-07-02 NOTE — Patient Instructions (Signed)
I suspect that these episodes are migraines.  If they become more frequent in which it may require a daily medication to try and reduce frequency, contact me and we can start something and then have you follow up.

## 2021-07-03 DIAGNOSIS — C099 Malignant neoplasm of tonsil, unspecified: Secondary | ICD-10-CM | POA: Diagnosis not present

## 2021-07-05 DIAGNOSIS — Z85819 Personal history of malignant neoplasm of unspecified site of lip, oral cavity, and pharynx: Secondary | ICD-10-CM | POA: Diagnosis not present

## 2021-07-05 DIAGNOSIS — H903 Sensorineural hearing loss, bilateral: Secondary | ICD-10-CM | POA: Diagnosis not present

## 2021-07-12 ENCOUNTER — Other Ambulatory Visit (HOSPITAL_COMMUNITY): Payer: Self-pay | Admitting: Radiation Oncology

## 2021-07-12 DIAGNOSIS — C099 Malignant neoplasm of tonsil, unspecified: Secondary | ICD-10-CM

## 2021-07-25 ENCOUNTER — Other Ambulatory Visit (HOSPITAL_COMMUNITY): Payer: Self-pay

## 2021-07-25 DIAGNOSIS — C099 Malignant neoplasm of tonsil, unspecified: Secondary | ICD-10-CM

## 2021-07-25 NOTE — Progress Notes (Signed)
Order placed for PET scan per Dr. Katragadda 

## 2021-07-26 ENCOUNTER — Encounter (HOSPITAL_COMMUNITY): Payer: Self-pay

## 2021-07-26 ENCOUNTER — Ambulatory Visit (HOSPITAL_COMMUNITY): Payer: Medicare HMO

## 2021-07-31 DIAGNOSIS — J309 Allergic rhinitis, unspecified: Secondary | ICD-10-CM | POA: Diagnosis not present

## 2021-07-31 DIAGNOSIS — R42 Dizziness and giddiness: Secondary | ICD-10-CM | POA: Diagnosis not present

## 2021-07-31 DIAGNOSIS — I10 Essential (primary) hypertension: Secondary | ICD-10-CM | POA: Diagnosis not present

## 2021-07-31 DIAGNOSIS — C099 Malignant neoplasm of tonsil, unspecified: Secondary | ICD-10-CM | POA: Diagnosis not present

## 2021-07-31 DIAGNOSIS — E785 Hyperlipidemia, unspecified: Secondary | ICD-10-CM | POA: Diagnosis not present

## 2021-07-31 DIAGNOSIS — I890 Lymphedema, not elsewhere classified: Secondary | ICD-10-CM | POA: Diagnosis not present

## 2021-08-02 ENCOUNTER — Ambulatory Visit (HOSPITAL_COMMUNITY)
Admission: RE | Admit: 2021-08-02 | Discharge: 2021-08-02 | Disposition: A | Discharge status: Home / Self Care | Payer: Medicare HMO | Source: Ambulatory Visit | Attending: Hematology | Admitting: Hematology

## 2021-08-02 DIAGNOSIS — C099 Malignant neoplasm of tonsil, unspecified: Secondary | ICD-10-CM | POA: Diagnosis not present

## 2021-08-02 DIAGNOSIS — J9811 Atelectasis: Secondary | ICD-10-CM | POA: Diagnosis not present

## 2021-08-02 DIAGNOSIS — I2510 Atherosclerotic heart disease of native coronary artery without angina pectoris: Secondary | ICD-10-CM | POA: Diagnosis not present

## 2021-08-02 DIAGNOSIS — K439 Ventral hernia without obstruction or gangrene: Secondary | ICD-10-CM | POA: Diagnosis not present

## 2021-08-02 MED ORDER — FLUDEOXYGLUCOSE F - 18 (FDG) INJECTION
7.9200 | Freq: Once | INTRAVENOUS | Status: AC | PRN
  Administered 2021-08-02: 7.92 via INTRAVENOUS

## 2021-10-03 ENCOUNTER — Ambulatory Visit (HOSPITAL_COMMUNITY)
Admission: RE | Admit: 2021-10-03 | Discharge: 2021-10-03 | Disposition: A | Discharge status: Home / Self Care | Payer: Medicare HMO | Source: Ambulatory Visit | Attending: Hematology | Admitting: Hematology

## 2021-10-03 ENCOUNTER — Inpatient Hospital Stay (HOSPITAL_COMMUNITY): Payer: Medicare HMO | Attending: Hematology

## 2021-10-03 ENCOUNTER — Encounter (HOSPITAL_COMMUNITY): Payer: Self-pay

## 2021-10-03 DIAGNOSIS — Z923 Personal history of irradiation: Secondary | ICD-10-CM | POA: Diagnosis not present

## 2021-10-03 DIAGNOSIS — G629 Polyneuropathy, unspecified: Secondary | ICD-10-CM | POA: Diagnosis not present

## 2021-10-03 DIAGNOSIS — Z9221 Personal history of antineoplastic chemotherapy: Secondary | ICD-10-CM | POA: Diagnosis not present

## 2021-10-03 DIAGNOSIS — C099 Malignant neoplasm of tonsil, unspecified: Secondary | ICD-10-CM | POA: Insufficient documentation

## 2021-10-03 DIAGNOSIS — Z79899 Other long term (current) drug therapy: Secondary | ICD-10-CM | POA: Diagnosis not present

## 2021-10-03 DIAGNOSIS — Z87891 Personal history of nicotine dependence: Secondary | ICD-10-CM | POA: Insufficient documentation

## 2021-10-03 DIAGNOSIS — Z85818 Personal history of malignant neoplasm of other sites of lip, oral cavity, and pharynx: Secondary | ICD-10-CM | POA: Insufficient documentation

## 2021-10-03 LAB — CBC WITH DIFFERENTIAL/PLATELET
Abs Immature Granulocytes: 0.01 10*3/uL (ref 0.00–0.07)
Basophils Absolute: 0.1 10*3/uL (ref 0.0–0.1)
Basophils Relative: 2 %
Eosinophils Absolute: 0.4 10*3/uL (ref 0.0–0.5)
Eosinophils Relative: 8 %
HCT: 36.9 % — ABNORMAL LOW (ref 39.0–52.0)
Hemoglobin: 12.1 g/dL — ABNORMAL LOW (ref 13.0–17.0)
Immature Granulocytes: 0 %
Lymphocytes Relative: 16 %
Lymphs Abs: 0.9 10*3/uL (ref 0.7–4.0)
MCH: 32.1 pg (ref 26.0–34.0)
MCHC: 32.8 g/dL (ref 30.0–36.0)
MCV: 97.9 fL (ref 80.0–100.0)
Monocytes Absolute: 0.7 10*3/uL (ref 0.1–1.0)
Monocytes Relative: 14 %
Neutro Abs: 3.2 10*3/uL (ref 1.7–7.7)
Neutrophils Relative %: 60 %
Platelets: 160 10*3/uL (ref 150–400)
RBC: 3.77 MIL/uL — ABNORMAL LOW (ref 4.22–5.81)
RDW: 14 % (ref 11.5–15.5)
WBC: 5.3 10*3/uL (ref 4.0–10.5)
nRBC: 0 % (ref 0.0–0.2)

## 2021-10-03 LAB — COMPREHENSIVE METABOLIC PANEL
ALT: 28 U/L (ref 0–44)
AST: 30 U/L (ref 15–41)
Albumin: 3.7 g/dL (ref 3.5–5.0)
Alkaline Phosphatase: 64 U/L (ref 38–126)
Anion gap: 6 (ref 5–15)
BUN: 33 mg/dL — ABNORMAL HIGH (ref 8–23)
CO2: 26 mmol/L (ref 22–32)
Calcium: 8.8 mg/dL — ABNORMAL LOW (ref 8.9–10.3)
Chloride: 107 mmol/L (ref 98–111)
Creatinine, Ser: 1.17 mg/dL (ref 0.61–1.24)
GFR, Estimated: >60 mL/min (ref >60)
Glucose, Bld: 114 mg/dL — ABNORMAL HIGH (ref 70–99)
Potassium: 4.2 mmol/L (ref 3.5–5.1)
Sodium: 139 mmol/L (ref 135–145)
Total Bilirubin: 1 mg/dL (ref 0.3–1.2)
Total Protein: 6.2 g/dL — ABNORMAL LOW (ref 6.5–8.1)

## 2021-10-03 LAB — TSH: TSH: 3.512 u[IU]/mL (ref 0.350–4.500)

## 2021-10-03 LAB — MAGNESIUM: Magnesium: 2 mg/dL (ref 1.7–2.4)

## 2021-10-03 MED ORDER — HEPARIN SOD (PORK) LOCK FLUSH 100 UNIT/ML IV SOLN: 500.0000 [IU] | Freq: Once | INTRAVENOUS | Status: DC

## 2021-10-03 MED ORDER — SODIUM CHLORIDE 0.9% FLUSH: 10.0000 mL | Freq: Once | INTRAVENOUS | Status: DC

## 2021-10-03 MED ORDER — HEPARIN SOD (PORK) LOCK FLUSH 100 UNIT/ML IV SOLN
INTRAVENOUS | Status: AC
  Administered 2021-10-03: 500 [IU] via INTRAVENOUS
  Filled 2021-10-03: qty 5

## 2021-10-03 MED ORDER — IOHEXOL 300 MG/ML  SOLN
100.0000 mL | Freq: Once | INTRAMUSCULAR | Status: AC | PRN
  Administered 2021-10-03: 75 mL via INTRAVENOUS

## 2021-10-10 ENCOUNTER — Inpatient Hospital Stay (HOSPITAL_COMMUNITY): Payer: Medicare HMO | Attending: Hematology | Admitting: Hematology

## 2021-10-10 VITALS — BP 151/71 | HR 50 | Temp 97.5°F | Resp 16 | Ht 67.0 in | Wt 145.4 lb

## 2021-10-10 DIAGNOSIS — Z85818 Personal history of malignant neoplasm of other sites of lip, oral cavity, and pharynx: Secondary | ICD-10-CM | POA: Diagnosis present

## 2021-10-10 DIAGNOSIS — Z801 Family history of malignant neoplasm of trachea, bronchus and lung: Secondary | ICD-10-CM | POA: Diagnosis not present

## 2021-10-10 DIAGNOSIS — Z9221 Personal history of antineoplastic chemotherapy: Secondary | ICD-10-CM | POA: Diagnosis not present

## 2021-10-10 DIAGNOSIS — Z79899 Other long term (current) drug therapy: Secondary | ICD-10-CM | POA: Diagnosis not present

## 2021-10-10 DIAGNOSIS — Z87891 Personal history of nicotine dependence: Secondary | ICD-10-CM | POA: Diagnosis not present

## 2021-10-10 DIAGNOSIS — D649 Anemia, unspecified: Secondary | ICD-10-CM | POA: Insufficient documentation

## 2021-10-10 DIAGNOSIS — C099 Malignant neoplasm of tonsil, unspecified: Secondary | ICD-10-CM

## 2021-10-10 DIAGNOSIS — Z905 Acquired absence of kidney: Secondary | ICD-10-CM | POA: Diagnosis not present

## 2021-10-10 DIAGNOSIS — Z85528 Personal history of other malignant neoplasm of kidney: Secondary | ICD-10-CM

## 2021-10-10 DIAGNOSIS — Z85828 Personal history of other malignant neoplasm of skin: Secondary | ICD-10-CM

## 2021-10-10 NOTE — Progress Notes (Signed)
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 There is preferential submucosal growth and the mass is indistinguishable from the left prevertebral space and lower left stylopharyngeaus. No convincing adenopathy. 2. Mixed density right upper lobe nodule, attention on anticipated PET.   11/29/2020 Initial Diagnosis   Tonsil cancer ([place])   11/29/2020 Cancer Staging   Staging form: Pharynx - HPV-Mediated Oropharynx, AJCC 8th Edition - Clinical stage from 11/29/2020: cT4, p16+ - Signed by [name], MD on 11/29/2020 Stage prefix: Initial diagnosis    01/09/2021 - 02/20/2021 Chemotherapy   Patient is on Treatment Plan : HEAD/NECK Cisplatin q7d        CANCER STAGING: Cancer Staging  Tonsil cancer [facility]) Staging form: Pharynx - HPV-Mediated Oropharynx, AJCC 8th Edition - Clinical stage from 11/29/2020: cT4, p16+ - Signed by [name], MD on 11/29/2020   INTERVAL HISTORY:  Douglas [name], a 76 y.o. male, returns for routine follow-up of his left tonsil cancer. Douglas [name] was last seen on 04/19/2021.   Today he reports feeling good. He reports tingling/numbness and warm sensation in his right foot and toes; he denies burning sensation and pain. He  reports occasional trouble swallowing dry food such as breads. His left hip tingling has resolved.   REVIEW OF SYSTEMS:  Review of Systems  Constitutional:  Negative for appetite change and fatigue.  HENT:   Positive for trouble swallowing (occasional).   Neurological:  Positive for numbness (R foot and toes).  All other systems reviewed and are negative.  PAST MEDICAL/SURGICAL HISTORY:  Past Medical History:  Diagnosis Date   Arthritis    Cancer ([place])    Skin, and Kidney   Hypertension    Pre-diabetes    Skin cancer    Past Surgical History:  Procedure Laterality Date   COLONOSCOPY     HERNIA REPAIR     IR GASTROSTOMY TUBE MOD SED  12/11/2020   IR GASTROSTOMY TUBE REMOVAL  04/25/2021   IR IMAGING GUIDED PORT INSERTION  12/11/2020   LAPAROSCOPIC PARTIAL NEPHRECTOMY Left    PARTIAL NEPHRECTOMY Left 2009   REVERSE SHOULDER ARTHROPLASTY Right 09/10/2019   Procedure: REVERSE SHOULDER ARTHROPLASTY;  Surgeon: [name], MD;  Location: [facility];  Service: Orthopedics;  Laterality: Right;  interscalene block   SKIN SURGERY      SOCIAL HISTORY:  Social History   Socioeconomic History   Marital status: Widowed    Spouse name: Not on file   Number of children: Not on file   Years of education: Not on file   Highest education level: Not on file  Occupational History    Employer: [employer]  Tobacco Use   Smoking status: Former    Packs/day: 1.00  Years: 20.00    Pack years: 20.00    Types: Cigarettes    Quit date: 2010    Years since quitting: 13.4   Smokeless tobacco: Never  Vaping Use   Vaping Use: Never used  Substance and Sexual Activity   Alcohol use: Not Currently    Alcohol/week: 1.0 standard drink    Types: 1 Cans of beer per week    Comment: occ   Drug use: Never   Sexual activity: Not on file  Other Topics Concern   Not on file  Social History Narrative   Right and Left Handed.      Lives in a one story home    Drinks Caffeine    Social Determinants  of Health   Financial Resource Strain: Low Risk    Difficulty of Paying Living Expenses: Not hard at all  Food Insecurity: No Food Insecurity   Worried About Charity fundraiser in the Last Year: Never true   Arboriculturist in the Last Year: Never true  Transportation Needs: No Transportation Needs   Lack of Transportation (Medical): No   Lack of Transportation (Non-Medical): No  Physical Activity: Sufficiently Active   Days of Exercise per Week: 7 days   Minutes of Exercise per Session: 30 min  Stress: No Stress Concern Present   Feeling of Stress : Not at all  Social Connections: Socially Isolated   Frequency of Communication with Friends and Family: More than three times a week   Frequency of Social Gatherings with Friends and Family: More than three times a week   Attends Religious Services: Never   Marine scientist or Organizations: No   Attends Archivist Meetings: Never   Marital Status: Widowed  Human resources officer Violence: Not At Risk   Fear of Current or Ex-Partner: No   Emotionally Abused: No   Physically Abused: No   Sexually Abused: No    FAMILY HISTORY:  Family History  Problem Relation Age of Onset   Diabetes Mother    Pulmonary embolism Father    Lung cancer Sister    Lung cancer Sister    Diabetes Brother    Lung cancer Brother     CURRENT MEDICATIONS:  Current Outpatient Medications  Medication Sig Dispense Refill   acetaminophen-codeine (TYLENOL #3) 300-30 MG tablet      amLODipine (NORVASC) 10 MG tablet Take 10 mg by mouth daily.     atenolol (TENORMIN) 25 MG tablet Take 25 mg by mouth daily.     cholecalciferol (VITAMIN D3) 25 MCG (1000 UNIT) tablet Take 1,000 Units by mouth daily.     CISPLATIN IV Inject 40 mg/m2 into the vein once a week.     diclofenac (VOLTAREN) 75 MG EC tablet Take 75 mg by mouth 2 (two) times daily.     gabapentin (NEURONTIN) 300 MG capsule TAKE ONE CAPSULE BY MOUTH AT BEDTIME 30 capsule 6    lidocaine-prilocaine (EMLA) cream Apply to affected area once 30 g 3   lisinopril (ZESTRIL) 40 MG tablet Take 40 mg by mouth daily.     Menaquinone-7 (VITAMIN K2 PO) Take 1 tablet by mouth daily.     Multiple Vitamins-Minerals (MULTIVITAMIN WITH MINERALS) tablet Take 1 tablet by mouth daily.     Nutritional Supplements (FEEDING SUPPLEMENT, OSMOLITE 1.5 CAL,) LIQD Give 6 cartons Osmolite 1.5 via tube split over 4 feedings/day. Flush tube with 60 ml water before and after each bolus feeding. Drink by mouth or give  via tube additional 2.5 c fluids/day. This provides 2130 kcal, 89 grams protein, 2159 ml total water. Meets 100% needs.  0   ondansetron (ZOFRAN) 8 MG tablet Take 1 tablet (8 mg total) by mouth 2 (two) times daily as needed. Start on the third day after cisplatin chemotherapy. 30 tablet 1   simvastatin (ZOCOR) 20 MG tablet Take 20 mg by mouth daily.     tamsulosin (FLOMAX) 0.4 MG CAPS capsule Take 0.8 mg by mouth daily.     zinc gluconate 50 MG tablet Take 50 mg by mouth daily.     No current facility-administered medications for this visit.    ALLERGIES:  No Known Allergies  PHYSICAL EXAM:  Performance status (ECOG): 0 - Asymptomatic  Vitals:   10/10/21 1413  BP: (!) 151/71  Pulse: (!) 50  Resp: 16  Temp: (!) 97.5 F (36.4 C)  SpO2: 95%   Wt Readings from Last 3 Encounters:  10/10/21 145 lb 6.4 oz (66 kg)  07/02/21 144 lb (65.3 kg)  04/19/21 143 lb 12.8 oz (65.2 kg)   Physical Exam Vitals reviewed.  Constitutional:      Appearance: Normal appearance.  HENT:     Mouth/Throat:     Mouth: No oral lesions.  Cardiovascular:     Rate and Rhythm: Normal rate and regular rhythm.     Pulses: Normal pulses.     Heart sounds: Normal heart sounds.  Pulmonary:     Effort: Pulmonary effort is normal.     Breath sounds: Normal breath sounds.  Lymphadenopathy:     Cervical: No cervical adenopathy.     Right cervical: No superficial, deep or posterior cervical adenopathy.     Left cervical: No superficial, deep or posterior cervical adenopathy.     Upper Body:     Right upper body: No supraclavicular adenopathy.     Left upper body: No supraclavicular adenopathy.  Neurological:     General: No focal deficit present.     Mental Status: He is alert and oriented to person, place, and time.  Psychiatric:        Mood and Affect: Mood normal.        Behavior: Behavior normal.     LABORATORY DATA:  I have reviewed the labs as listed.     Latest Ref Rng & Units 10/03/2021    9:56 AM 03/14/2021    1:42 PM 02/19/2021   12:31 PM  CBC  WBC 4.0 - 10.5 K/uL 5.3   4.6   5.9    Hemoglobin 13.0 - 17.0 g/dL 12.1   11.5   11.8    Hematocrit 39.0 - 52.0 % 36.9   33.2   35.1    Platelets 150 - 400 K/uL 160   268   193        Latest Ref Rng & Units 10/03/2021    9:56 AM 03/14/2021    1:42 PM 02/19/2021   12:31 PM  CMP  Glucose 70 - 99 mg/dL 114   105   94    BUN 8 - 23 mg/dL 33   27   27    Creatinine 0.61 - 1.24 mg/dL 1.17   0.97   0.86    Sodium 135 - 145 mmol/L 139   137   133    Potassium 3.5 - 5.1 mmol/L 4.2   4.9   4.6    Chloride 98 - 111 mmol/L 107   98   99  CO2 22 - 32 mmol/L '26   29   27    '$ Calcium 8.9 - 10.3 mg/dL 8.8   9.4   9.0    Total Protein 6.5 - 8.1 g/dL 6.2   7.0   7.0    Total Bilirubin 0.3 - 1.2 mg/dL 1.0   0.4   0.5    Alkaline Phos 38 - 126 U/L 64   99   94    AST 15 - 41 U/L '30   20   18    '$ ALT 0 - 44 U/L '28   18   18      '$ DIAGNOSTIC IMAGING:  I have independently reviewed the scans and discussed with the patient. CT SOFT TISSUE NECK W CONTRAST  Result Date: 10/03/2021 CLINICAL DATA:  Head neck cancer monitoring. Follow-up tonsil cancer status post chemotherapy. Diagnosis May 2022 EXAM: CT NECK WITH CONTRAST TECHNIQUE: Multidetector CT imaging of the neck was performed using the standard protocol following the bolus administration of intravenous contrast. RADIATION DOSE REDUCTION: This exam was performed according to the departmental  dose-optimization program which includes automated exposure control, adjustment of the mA and/or kV according to patient size and/or use of iterative reconstruction technique. CONTRAST:  28m OMNIPAQUE IOHEXOL 300 MG/ML  SOLN COMPARISON:  PET CT from yesterday. Neck CT with contrast 11/21/2020 FINDINGS: Pharynx and larynx: Normalized, symmetric appearance of the oropharynx. No evidence of residual tumor Salivary glands: Symmetric post treatment atrophy. Thyroid: Unremarkable when accounting for streak artifact Lymph nodes: None enlarged or heterogeneous density. Vascular: Atheromatous calcifications. Porta catheter at the right IJ. Limited intracranial: Negative Visualized orbits: Negative Mastoids and visualized paranasal sinuses: Clear Skeleton: Advanced and generalized cervical spine degeneration. No acute or focal finding Upper chest: Negative IMPRESSION: No evidence of residual or recurrent disease. Electronically Signed   By: JJorje GuildM.D.   On: 10/03/2021 21:38     ASSESSMENT:  1.  Left tonsillar squamous cell cancer: - Left tonsillar biopsy by Dr. TBenjamine Molaon 11/15/2020 consistent with squamous cell carcinoma.  P16 is strongly positive. - PET scan on 12/07/2020-shows 4.1 cm left palatine tonsil mass with SUV 16.  0.8 cm left level 3 lymph node SUV 4.4.  Elongated 0.9 x 1 cm right lower lobe nodule along the azygoesophageal recess has accentuated metabolic activity with SUV 5.8.  This has some solid component and was not visible on chest CT of 04/24/2020.  Differential includes unusual morphology of metastatic disease versus inflammatory process.  Groundglass opacity laterally in the right upper lobe likely inflammatory with SUV 1.4.  0.6 cm right middle lobe nodule not hypermetabolic. - Concurrent chemoradiation therapy from 01/09/2021 through 02/26/2021 - Pretreatment audiogram from Dr. TDeeann Saintoffice showed bilateral sensorineural hearing loss.  Hearing on the left side is slightly worse due to  tumor.  2.  Social/family history: - He has a retired after working for [employer]  He quit smoking 12 years ago.  He smoked 2 packs/day for 20 years. - Sister had lung cancer.  2 sisters died of lung cancer.  Brother had lung cancer.   PLAN:  1.  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 - CT soft tissue neck on 10/03/2021 with no evidence of recurrence. - Labs showed normal LFTs and creatinine.  CBC was grossly normal except mild normocytic anemia.  He was seen by Dr. TMeryle Ready2 months ago. - RTC 6 months for follow-up with repeat  scan and labs.  We will do anemia work-up at that time.  2.  Nutrition: - Feeding tube is discontinued.  He is able to eat all sorts of foods except some difficulty with breads.  3.  Peripheral neuropathy: - He reports numbness under the right foot toes.  Continue gabapentin at bedtime.   Orders placed this encounter:  No orders of the defined types were placed in this encounter.    Derek Ociel, MD [place] [phone]   I, Thana Ates, am acting as a scribe for Dr. Derek Greydon.  I, Derek Tiquan MD, have reviewed the above documentation for accuracy and completeness, and I agree with the above.

## 2021-10-10 NOTE — Patient Instructions (Addendum)
[place] at [facility] Discharge Instructions  You were seen today by Dr. Delton Coombes. He went over your recent results. You will be scheduled for a CT scan of your neck prior to your next appointment. Dr. Delton Coombes will see you back in 6 months for labs and follow up.   Thank you for choosing [place] at [facility] to provide your oncology and hematology care.  To afford each patient quality time with our provider, please arrive at least 15 minutes before your scheduled appointment time.   If you have a lab appointment with the [facility] please come in thru the Main Entrance and check in at the main information desk  You need to re-schedule your appointment should you arrive 10 or more minutes late.  We strive to give you quality time with our providers, and arriving late affects you and other patients whose appointments are after yours.  Also, if you no show three or more times for appointments you may be dismissed from the clinic at the providers discretion.     Again, thank you for choosing [facility].  Our hope is that these requests will decrease the amount of time that you wait before being seen by our physicians.       _____________________________________________________________  Should you have questions after your visit to [facility] please contact our office at [phone] between the hours of 8:00 a.m. and 4:30 p.m.  Voicemails left after 4:00 p.m. will not be returned until the following business day.  For prescription refill requests, have your pharmacy contact our office and allow 72 hours.    Cancer Center Support Programs:   > Cancer Support Group  2nd Tuesday of the month 1pm-2pm, [place] at [facility] Discharge Instructions     Thank you for choosing [place] at [facility] to provide your oncology and hematology care.   To afford each patient quality time with our provider, please arrive at least 15 minutes before your scheduled appointment time.   If you have a lab appointment with the [facility] please come in thru the Main Entrance and check in at the main information desk.  You need to re-schedule your appointment should you arrive 10 or more minutes late.  We strive to give you quality time with our providers, and arriving late affects you and other patients whose appointments are after yours.  Also, if you no show three or more times for appointments you may be dismissed from the clinic at the providers discretion.     Again, thank you for choosing [facility]  Our hope is that these requests will decrease the amount of time that you wait before being seen by our physicians.       _____________________________________________________________  Should you have questions after your visit to [facility], please contact our office at [phone] and follow the prompts.  Our office hours are 8:00 a.m. and 4:30 p.m. Monday - Friday.  Please note that voicemails left after 4:00 p.m. may not be returned until the following business day.  We are closed weekends and major holidays.  You do have access to a nurse 24-7, just call the main number to the clinic [phone] and do not press any options, hold on the line and a nurse will answer the phone.    For prescription refill requests,  have your pharmacy contact our office and allow 72 hours.    Due to Covid, you will need to wear a mask upon entering the hospital. If you do not have a mask, a mask will be given to you at the Main Entrance upon arrival. For doctor visits, patients may have 1 support person age 15 or older with them. For treatment visits, patients can not have anyone with them due to social distancing guidelines and our immunocompromised population.

## 2021-10-18 ENCOUNTER — Ambulatory Visit (HOSPITAL_COMMUNITY): Payer: Medicare HMO | Admitting: Hematology

## 2021-11-18 ENCOUNTER — Other Ambulatory Visit (HOSPITAL_COMMUNITY): Payer: Self-pay | Admitting: Hematology

## 2021-11-19 ENCOUNTER — Encounter (HOSPITAL_COMMUNITY): Payer: Self-pay | Admitting: Hematology and Oncology

## 2022-01-11 ENCOUNTER — Inpatient Hospital Stay: Payer: Medicare HMO | Attending: Hematology

## 2022-01-11 DIAGNOSIS — Z452 Encounter for adjustment and management of vascular access device: Secondary | ICD-10-CM | POA: Diagnosis present

## 2022-01-11 DIAGNOSIS — Z85818 Personal history of malignant neoplasm of other sites of lip, oral cavity, and pharynx: Secondary | ICD-10-CM | POA: Insufficient documentation

## 2022-01-11 MED ORDER — HEPARIN SOD (PORK) LOCK FLUSH 100 UNIT/ML IV SOLN
500.0000 [IU] | Freq: Once | INTRAVENOUS | Status: AC
  Administered 2022-01-11: 500 [IU] via INTRAVENOUS

## 2022-01-11 MED ORDER — SODIUM CHLORIDE 0.9% FLUSH
10.0000 mL | INTRAVENOUS | Status: DC | PRN
  Administered 2022-01-11: 10 mL via INTRAVENOUS

## 2022-01-11 NOTE — Patient Instructions (Signed)
[facility]  Discharge Instructions: Thank you for choosing [facility] to provide your oncology and hematology care.  If you have a lab appointment with the [facility], please come in thru the Main Entrance and check in at the main information desk.  Wear comfortable clothing and clothing appropriate for easy access to any Portacath or PICC line.   We strive to give you quality time with your provider. You may need to reschedule your appointment if you arrive late (15 or more minutes).  Arriving late affects you and other patients whose appointments are after yours.  Also, if you miss three or more appointments without notifying the office, you may be dismissed from the clinic at the provider's discretion.      For prescription refill requests, have your pharmacy contact our office and allow 72 hours for refills to be completed.    Today you received port flush   BELOW ARE SYMPTOMS THAT SHOULD BE REPORTED IMMEDIATELY: *FEVER GREATER THAN 100.4 F (38 C) OR HIGHER *CHILLS OR SWEATING *NAUSEA AND VOMITING THAT IS NOT CONTROLLED WITH YOUR NAUSEA MEDICATION *UNUSUAL SHORTNESS OF BREATH *UNUSUAL BRUISING OR BLEEDING *URINARY PROBLEMS (pain or burning when urinating, or frequent urination) *BOWEL PROBLEMS (unusual diarrhea, constipation, pain near the anus) TENDERNESS IN MOUTH AND THROAT WITH OR WITHOUT PRESENCE OF ULCERS (sore throat, sores in mouth, or a toothache) UNUSUAL RASH, SWELLING OR PAIN  UNUSUAL VAGINAL DISCHARGE OR ITCHING   Items with * indicate a potential emergency and should be followed up as soon as possible or go to the Emergency Department if any problems should occur.  Please show the CHEMOTHERAPY ALERT CARD or IMMUNOTHERAPY ALERT CARD at check-in to the Emergency Department and triage nurse.  Should you have questions after your visit or need to cancel or reschedule your appointment, please contact [facility]  [phone]  and follow the prompts.  Office hours are 8:00 a.m. to 4:30 p.m. Monday - Friday. Please note that voicemails left after 4:00 p.m. may not be returned until the following business day.  We are closed weekends and major holidays. You have access to a nurse at all times for urgent questions. Please call the main number to the clinic [phone] and follow the prompts.  For any non-urgent questions, you may also contact your provider using MyChart. We now offer e-Visits for anyone 7 and older to request care online for non-urgent symptoms. For details visit [email or web address].   Also download the MyChart app! Go to the app store, search "MyChart", open the app, select [facility], and log in with your MyChart username and password.  Masks are optional in the cancer centers. If you would like for your care team to wear a mask while they are taking care of you, please let them know. You may have one support person who is at least 74 years old accompany you for your appointments.

## 2022-01-11 NOTE — Progress Notes (Signed)
Galvin Proffer presented for Portacath access and flush.  Portacath located right chest wall accessed with  H 20 needle.  Good blood return present. Portacath flushed with 34m NS and 500U/579mHeparin and needle removed intact. No bruising or swelling noted at the the site.  Procedure tolerated well and without incident.     Treatment given today per MD orders. Tolerated infusion without adverse affects. Vital signs stable. No complaints at this time. Discharged from clinic ambulatory in stable condition. Alert and oriented x 3. F/U with [facility] scheduled.

## 2022-01-25 DIAGNOSIS — M79641 Pain in right hand: Secondary | ICD-10-CM | POA: Insufficient documentation

## 2022-04-11 ENCOUNTER — Ambulatory Visit (HOSPITAL_COMMUNITY)
Admission: RE | Admit: 2022-04-11 | Discharge: 2022-04-11 | Disposition: A | Discharge status: Home / Self Care | Payer: Medicare HMO | Source: Ambulatory Visit | Attending: Hematology | Admitting: Hematology

## 2022-04-11 ENCOUNTER — Inpatient Hospital Stay: Payer: Medicare HMO | Attending: Hematology

## 2022-04-11 ENCOUNTER — Other Ambulatory Visit (HOSPITAL_COMMUNITY): Payer: Medicare HMO

## 2022-04-11 VITALS — BP 110/60 | HR 58 | Temp 98.6°F | Resp 16

## 2022-04-11 DIAGNOSIS — I10 Essential (primary) hypertension: Secondary | ICD-10-CM | POA: Insufficient documentation

## 2022-04-11 DIAGNOSIS — C099 Malignant neoplasm of tonsil, unspecified: Secondary | ICD-10-CM | POA: Insufficient documentation

## 2022-04-11 DIAGNOSIS — R1310 Dysphagia, unspecified: Secondary | ICD-10-CM | POA: Diagnosis not present

## 2022-04-11 DIAGNOSIS — Z801 Family history of malignant neoplasm of trachea, bronchus and lung: Secondary | ICD-10-CM | POA: Insufficient documentation

## 2022-04-11 DIAGNOSIS — Z79899 Other long term (current) drug therapy: Secondary | ICD-10-CM | POA: Insufficient documentation

## 2022-04-11 DIAGNOSIS — Z85528 Personal history of other malignant neoplasm of kidney: Secondary | ICD-10-CM | POA: Insufficient documentation

## 2022-04-11 DIAGNOSIS — R682 Dry mouth, unspecified: Secondary | ICD-10-CM | POA: Diagnosis not present

## 2022-04-11 DIAGNOSIS — R7989 Other specified abnormal findings of blood chemistry: Secondary | ICD-10-CM | POA: Insufficient documentation

## 2022-04-11 DIAGNOSIS — Z905 Acquired absence of kidney: Secondary | ICD-10-CM | POA: Diagnosis not present

## 2022-04-11 DIAGNOSIS — Z452 Encounter for adjustment and management of vascular access device: Secondary | ICD-10-CM | POA: Insufficient documentation

## 2022-04-11 DIAGNOSIS — Z95828 Presence of other vascular implants and grafts: Secondary | ICD-10-CM

## 2022-04-11 DIAGNOSIS — Z85828 Personal history of other malignant neoplasm of skin: Secondary | ICD-10-CM | POA: Insufficient documentation

## 2022-04-11 DIAGNOSIS — Z85818 Personal history of malignant neoplasm of other sites of lip, oral cavity, and pharynx: Secondary | ICD-10-CM | POA: Diagnosis present

## 2022-04-11 DIAGNOSIS — G629 Polyneuropathy, unspecified: Secondary | ICD-10-CM | POA: Diagnosis not present

## 2022-04-11 DIAGNOSIS — Z87891 Personal history of nicotine dependence: Secondary | ICD-10-CM | POA: Insufficient documentation

## 2022-04-11 LAB — CBC WITH DIFFERENTIAL/PLATELET
Abs Immature Granulocytes: 0.02 10*3/uL (ref 0.00–0.07)
Basophils Absolute: 0.1 10*3/uL (ref 0.0–0.1)
Basophils Relative: 1 %
Eosinophils Absolute: 0.5 10*3/uL (ref 0.0–0.5)
Eosinophils Relative: 7 %
HCT: 40.3 % (ref 39.0–52.0)
Hemoglobin: 13.6 g/dL (ref 13.0–17.0)
Immature Granulocytes: 0 %
Lymphocytes Relative: 18 %
Lymphs Abs: 1.2 10*3/uL (ref 0.7–4.0)
MCH: 33.8 pg (ref 26.0–34.0)
MCHC: 33.7 g/dL (ref 30.0–36.0)
MCV: 100.2 fL — ABNORMAL HIGH (ref 80.0–100.0)
Monocytes Absolute: 0.7 10*3/uL (ref 0.1–1.0)
Monocytes Relative: 11 %
Neutro Abs: 3.9 10*3/uL (ref 1.7–7.7)
Neutrophils Relative %: 63 %
Platelets: 205 10*3/uL (ref 150–400)
RBC: 4.02 MIL/uL — ABNORMAL LOW (ref 4.22–5.81)
RDW: 15.2 % (ref 11.5–15.5)
WBC: 6.3 10*3/uL (ref 4.0–10.5)
nRBC: 0 % (ref 0.0–0.2)

## 2022-04-11 LAB — COMPREHENSIVE METABOLIC PANEL
ALT: 27 U/L (ref 0–44)
AST: 33 U/L (ref 15–41)
Albumin: 3.6 g/dL (ref 3.5–5.0)
Alkaline Phosphatase: 63 U/L (ref 38–126)
Anion gap: 5 (ref 5–15)
BUN: 30 mg/dL — ABNORMAL HIGH (ref 8–23)
CO2: 26 mmol/L (ref 22–32)
Calcium: 9.1 mg/dL (ref 8.9–10.3)
Chloride: 106 mmol/L (ref 98–111)
Creatinine, Ser: 1.54 mg/dL — ABNORMAL HIGH (ref 0.61–1.24)
GFR, Estimated: 47 mL/min — ABNORMAL LOW (ref >60)
Glucose, Bld: 80 mg/dL (ref 70–99)
Potassium: 4.5 mmol/L (ref 3.5–5.1)
Sodium: 137 mmol/L (ref 135–145)
Total Bilirubin: 0.6 mg/dL (ref 0.3–1.2)
Total Protein: 6.4 g/dL — ABNORMAL LOW (ref 6.5–8.1)

## 2022-04-11 LAB — IRON AND TIBC
Iron: 108 ug/dL (ref 45–182)
Saturation Ratios: 30 % (ref 17.9–39.5)
TIBC: 364 ug/dL (ref 250–450)
UIBC: 256 ug/dL

## 2022-04-11 LAB — VITAMIN B12: Vitamin B-12: 714 pg/mL (ref 180–914)

## 2022-04-11 LAB — POCT I-STAT CREATININE: Creatinine, Ser: 1.7 mg/dL — ABNORMAL HIGH (ref 0.61–1.24)

## 2022-04-11 LAB — TSH: TSH: 4.103 u[IU]/mL (ref 0.350–4.500)

## 2022-04-11 LAB — FOLATE: Folate: >40 ng/mL (ref >5.9)

## 2022-04-11 LAB — FERRITIN: Ferritin: 62 ng/mL (ref 24–336)

## 2022-04-11 MED ORDER — SODIUM CHLORIDE 0.9% FLUSH
10.0000 mL | Freq: Once | INTRAVENOUS | Status: AC
  Administered 2022-04-11: 10 mL via INTRAVENOUS

## 2022-04-11 MED ORDER — IOHEXOL 300 MG/ML  SOLN
75.0000 mL | Freq: Once | INTRAMUSCULAR | Status: AC | PRN
  Administered 2022-04-11: 60 mL via INTRAVENOUS

## 2022-04-11 MED ORDER — HEPARIN SOD (PORK) LOCK FLUSH 100 UNIT/ML IV SOLN
500.0000 [IU] | Freq: Once | INTRAVENOUS | Status: AC
  Administered 2022-04-11: 500 [IU] via INTRAVENOUS

## 2022-04-11 NOTE — Patient Instructions (Signed)
[facility]  Discharge Instructions: Thank you for choosing [facility] to provide your oncology and hematology care.  If you have a lab appointment with the [facility], please come in thru the Main Entrance and check in at the main information desk.  Wear comfortable clothing and clothing appropriate for easy access to any Portacath or PICC line.   We strive to give you quality time with your provider. You may need to reschedule your appointment if you arrive late (15 or more minutes).  Arriving late affects you and other patients whose appointments are after yours.  Also, if you miss three or more appointments without notifying the office, you may be dismissed from the clinic at the provider's discretion.      For prescription refill requests, have your pharmacy contact our office and allow 72 hours for refills to be completed.    Today you had your port assessed for scans with labs done   To help prevent nausea and vomiting after your treatment, we encourage you to take your nausea medication as directed.  BELOW ARE SYMPTOMS THAT SHOULD BE REPORTED IMMEDIATELY: *FEVER GREATER THAN 100.4 F (38 C) OR HIGHER *CHILLS OR SWEATING *NAUSEA AND VOMITING THAT IS NOT CONTROLLED WITH YOUR NAUSEA MEDICATION *UNUSUAL SHORTNESS OF BREATH *UNUSUAL BRUISING OR BLEEDING *URINARY PROBLEMS (pain or burning when urinating, or frequent urination) *BOWEL PROBLEMS (unusual diarrhea, constipation, pain near the anus) TENDERNESS IN MOUTH AND THROAT WITH OR WITHOUT PRESENCE OF ULCERS (sore throat, sores in mouth, or a toothache) UNUSUAL RASH, SWELLING OR PAIN  UNUSUAL VAGINAL DISCHARGE OR ITCHING   Items with * indicate a potential emergency and should be followed up as soon as possible or go to the Emergency Department if any problems should occur.  Please show the CHEMOTHERAPY ALERT CARD or IMMUNOTHERAPY ALERT CARD at check-in to the Emergency Department and triage  nurse.  Should you have questions after your visit or need to cancel or reschedule your appointment, please contact [facility] [phone]  and follow the prompts.  Office hours are 8:00 a.m. to 4:30 p.m. Monday - Friday. Please note that voicemails left after 4:00 p.m. may not be returned until the following business day.  We are closed weekends and major holidays. You have access to a nurse at all times for urgent questions. Please call the main number to the clinic [phone] and follow the prompts.  For any non-urgent questions, you may also contact your provider using MyChart. We now offer e-Visits for anyone 20 and older to request care online for non-urgent symptoms. For details visit [email or web address].   Also download the MyChart app! Go to the app store, search "MyChart", open the app, select [facility], and log in with your MyChart username and password.  Masks are optional in the cancer centers. If you would like for your care team to wear a mask while they are taking care of you, please let them know. You may have one support person who is at least 74 years old accompany you for your appointments.

## 2022-04-11 NOTE — Progress Notes (Signed)
Patient presents today for port flush for CT. Unable to get blood from patient's port, labs drawn from arm. Patient to CT. Left in satisfactory condition.

## 2022-04-18 ENCOUNTER — Inpatient Hospital Stay: Payer: Medicare HMO | Admitting: Hematology

## 2022-04-18 ENCOUNTER — Other Ambulatory Visit (HOSPITAL_COMMUNITY)
Admission: RE | Admit: 2022-04-18 | Discharge: 2022-04-18 | Disposition: A | Discharge status: Home / Self Care | Payer: Medicare HMO | Source: Ambulatory Visit | Attending: Hematology | Admitting: Hematology

## 2022-04-18 VITALS — BP 131/69 | HR 62 | Temp 98.1°F | Resp 16 | Ht 67.0 in | Wt 152.9 lb

## 2022-04-18 DIAGNOSIS — C099 Malignant neoplasm of tonsil, unspecified: Secondary | ICD-10-CM | POA: Insufficient documentation

## 2022-04-18 DIAGNOSIS — R7989 Other specified abnormal findings of blood chemistry: Secondary | ICD-10-CM | POA: Diagnosis not present

## 2022-04-18 DIAGNOSIS — Z452 Encounter for adjustment and management of vascular access device: Secondary | ICD-10-CM | POA: Diagnosis not present

## 2022-04-18 LAB — COMPREHENSIVE METABOLIC PANEL
ALT: 29 U/L (ref 0–44)
AST: 31 U/L (ref 15–41)
Albumin: 4 g/dL (ref 3.5–5.0)
Alkaline Phosphatase: 65 U/L (ref 38–126)
Anion gap: 5 (ref 5–15)
BUN: 25 mg/dL — ABNORMAL HIGH (ref 8–23)
CO2: 29 mmol/L (ref 22–32)
Calcium: 9.3 mg/dL (ref 8.9–10.3)
Chloride: 103 mmol/L (ref 98–111)
Creatinine, Ser: 1.45 mg/dL — ABNORMAL HIGH (ref 0.61–1.24)
GFR, Estimated: 51 mL/min — ABNORMAL LOW (ref >60)
Glucose, Bld: 97 mg/dL (ref 70–99)
Potassium: 4.6 mmol/L (ref 3.5–5.1)
Sodium: 137 mmol/L (ref 135–145)
Total Bilirubin: 0.9 mg/dL (ref 0.3–1.2)
Total Protein: 6.9 g/dL (ref 6.5–8.1)

## 2022-04-18 LAB — CBC WITH DIFFERENTIAL/PLATELET
Abs Immature Granulocytes: 0.03 10*3/uL (ref 0.00–0.07)
Basophils Absolute: 0.1 10*3/uL (ref 0.0–0.1)
Basophils Relative: 1 %
Eosinophils Absolute: 0.5 10*3/uL (ref 0.0–0.5)
Eosinophils Relative: 6 %
HCT: 40.7 % (ref 39.0–52.0)
Hemoglobin: 13.9 g/dL (ref 13.0–17.0)
Immature Granulocytes: 0 %
Lymphocytes Relative: 19 %
Lymphs Abs: 1.5 10*3/uL (ref 0.7–4.0)
MCH: 34.2 pg — ABNORMAL HIGH (ref 26.0–34.0)
MCHC: 34.2 g/dL (ref 30.0–36.0)
MCV: 100 fL (ref 80.0–100.0)
Monocytes Absolute: 1 10*3/uL (ref 0.1–1.0)
Monocytes Relative: 12 %
Neutro Abs: 4.6 10*3/uL (ref 1.7–7.7)
Neutrophils Relative %: 62 %
Platelets: 205 10*3/uL (ref 150–400)
RBC: 4.07 MIL/uL — ABNORMAL LOW (ref 4.22–5.81)
RDW: 14.7 % (ref 11.5–15.5)
WBC: 7.7 10*3/uL (ref 4.0–10.5)
nRBC: 0 % (ref 0.0–0.2)

## 2022-04-18 LAB — TSH: TSH: 6.496 u[IU]/mL — ABNORMAL HIGH (ref 0.350–4.500)

## 2022-04-18 NOTE — Patient Instructions (Addendum)
[facility] at [facility] Discharge Instructions   You were seen and examined today by Dr. Delton Coombes.  He reviewed the results of your CT scan which is normal.   He reviewed the results of your lab work which are mostly normal. Your kidney number is elevated to 1.7. We will recheck your kidney function today.  Return as scheduled in 6 months. We will repeat lab work and CT scan prior to this visit.        Thank you for choosing [facility] at [facility] to provide your oncology and hematology care.  To afford each patient quality time with our provider, please arrive at least 15 minutes before your scheduled appointment time.   If you have a lab appointment with the [facility] please come in thru the Main Entrance and check in at the main information desk.  You need to re-schedule your appointment should you arrive 10 or more minutes late.  We strive to give you quality time with our providers, and arriving late affects you and other patients whose appointments are after yours.  Also, if you no show three or more times for appointments you may be dismissed from the clinic at the providers discretion.     Again, thank you for choosing [facility].  Our hope is that these requests will decrease the amount of time that you wait before being seen by our physicians.       _____________________________________________________________  Should you have questions after your visit to [facility], please contact our office at [phone] and follow the prompts.  Our office hours are 8:00 a.m. and 4:30 p.m. Monday - Friday.  Please note that voicemails left after 4:00 p.m. may not be returned until the following business day.  We are closed weekends and major holidays.  You do have access to a nurse 24-7, just call the main number to the clinic [phone] and do not press any options, hold on the line and a nurse will  answer the phone.    For prescription refill requests, have your pharmacy contact our office and allow 72 hours.    Due to Covid, you will need to wear a mask upon entering the hospital. If you do not have a mask, a mask will be given to you at the Main Entrance upon arrival. For doctor visits, patients may have 1 support person age 74 or older with them. For treatment visits, patients can not have anyone with them due to social distancing guidelines and our immunocompromised population.

## 2022-04-18 NOTE — Progress Notes (Signed)
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 There is preferential submucosal growth and the mass is indistinguishable from the left prevertebral space and lower left stylopharyngeaus. No convincing adenopathy. 2. Mixed density right upper lobe nodule, attention on anticipated PET.   11/29/2020 Initial Diagnosis   Tonsil cancer ([facility])   11/29/2020 Cancer Staging   Staging form: Pharynx - HPV-Mediated Oropharynx, AJCC 8th Edition - Clinical stage from 11/29/2020: cT4, p16+ - Signed by [name], MD on 11/29/2020 Stage prefix: Initial diagnosis   01/09/2021 - 02/20/2021 Chemotherapy   Patient is on Treatment Plan : HEAD/NECK Cisplatin q7d       CANCER STAGING:  Cancer Staging  Tonsil cancer [facility]) Staging form: Pharynx - HPV-Mediated Oropharynx, AJCC 8th Edition - Clinical stage from 11/29/2020: cT4, p16+ - Signed by [name], MD on 11/29/2020   INTERVAL HISTORY:  Mr. Douglas [name], a 76 y.o. male, seen for follow-up of left tonsillar cancer.  He has some dysphagia to certain foods which is stable.  Also has dry mouth which is stable.  Denies any new onset pains.  Denies any tingling or numbness.  He is taking diclofenac 1-2 times per  week, previously used to take daily.  REVIEW OF SYSTEMS:  Review of Systems  Constitutional:  Negative for appetite change and fatigue.  HENT:   Positive for trouble swallowing (occasional).   All other systems reviewed and are negative.   PAST MEDICAL/SURGICAL HISTORY:  Past Medical History:  Diagnosis Date   Arthritis    Cancer ([facility])    Skin, and Kidney   Hypertension    Pre-diabetes    Skin cancer    Past Surgical History:  Procedure Laterality Date   COLONOSCOPY     HERNIA REPAIR     IR GASTROSTOMY TUBE MOD SED  12/11/2020   IR GASTROSTOMY TUBE REMOVAL  04/25/2021   IR IMAGING GUIDED PORT INSERTION  12/11/2020   LAPAROSCOPIC PARTIAL NEPHRECTOMY Left    PARTIAL NEPHRECTOMY Left 2009   REVERSE SHOULDER ARTHROPLASTY Right 09/10/2019   Procedure: REVERSE SHOULDER ARTHROPLASTY;  Surgeon: [name], MD;  Location: [facility];  Service: Orthopedics;  Laterality: Right;  interscalene block   SKIN SURGERY      SOCIAL HISTORY:  Social History   Socioeconomic History   Marital status: Widowed    Spouse name: Not on file   Number of children: Not on file   Years of education: Not on file   Highest education level: Not on file  Occupational History    Employer: [employer]  Tobacco Use   Smoking status: Former    Packs/day: 1.00    Years: 20.00    Total pack years: 20.00    Types: Cigarettes  Quit date: 2010    Years since quitting: 13.9   Smokeless tobacco: Never  Vaping Use   Vaping Use: Never used  Substance and Sexual Activity   Alcohol use: Not Currently    Alcohol/week: 1.0 standard drink of alcohol    Types: 1 Cans of beer per week    Comment: occ   Drug use: Never   Sexual activity: Not on file  Other Topics Concern   Not on file  Social History Narrative   Right and Left Handed.      Lives in a one story home    Drinks Caffeine    Social Determinants of Health   Financial Resource Strain: Low Risk  (11/29/2020)   Overall Financial Resource Strain  (CARDIA)    Difficulty of Paying Living Expenses: Not hard at all  Food Insecurity: No Food Insecurity (11/29/2020)   Hunger Vital Sign    Worried About Running Out of Food in the Last Year: Never true    Ran Out of Food in the Last Year: Never true  Transportation Needs: No Transportation Needs (11/29/2020)   PRAPARE - Hydrologist (Medical): No    Lack of Transportation (Non-Medical): No  Physical Activity: Sufficiently Active (11/29/2020)   Exercise Vital Sign    Days of Exercise per Week: 7 days    Minutes of Exercise per Session: 30 min  Stress: No Stress Concern Present (11/29/2020)   [place]    Feeling of Stress : Not at all  Social Connections: Socially Isolated (11/29/2020)   Social Connection and Isolation Panel [NHANES]    Frequency of Communication with Friends and Family: More than three times a week    Frequency of Social Gatherings with Friends and Family: More than three times a week    Attends Religious Services: Never    Marine scientist or Organizations: No    Attends Archivist Meetings: Never    Marital Status: Widowed  Intimate Partner Violence: Not At Risk (11/29/2020)   Humiliation, Afraid, Rape, and Kick questionnaire    Fear of Current or Ex-Partner: No    Emotionally Abused: No    Physically Abused: No    Sexually Abused: No    FAMILY HISTORY:  Family History  Problem Relation Age of Onset   Diabetes Mother    Pulmonary embolism Father    Lung cancer Sister    Lung cancer Sister    Diabetes Brother    Lung cancer Brother     CURRENT MEDICATIONS:  Current Outpatient Medications  Medication Sig Dispense Refill   acetaminophen-codeine (TYLENOL #3) 300-30 MG tablet      amLODipine (NORVASC) 10 MG tablet Take 10 mg by mouth daily.     atenolol (TENORMIN) 25 MG tablet Take 25 mg by mouth daily.     cholecalciferol (VITAMIN D3) 25 MCG (1000  UNIT) tablet Take 1,000 Units by mouth daily.     CISPLATIN IV Inject 40 mg/m2 into the vein once a week.     diclofenac (VOLTAREN) 75 MG EC tablet Take 75 mg by mouth 2 (two) times daily.     fluticasone (FLONASE) 50 MCG/ACT nasal spray Place into both nostrils.     gabapentin (NEURONTIN) 300 MG capsule TAKE ONE CAPSULE BY MOUTH AT BEDTIME 30 capsule 6   lidocaine-prilocaine (EMLA) cream Apply to affected area once 30 g 3   lisinopril (ZESTRIL) 40 MG tablet Take 40  mg by mouth daily.     meclizine (ANTIVERT) 25 MG tablet      Menaquinone-7 (VITAMIN K2 PO) Take 1 tablet by mouth daily.     moxifloxacin (VIGAMOX) 0.5 % ophthalmic solution USE 1 DROP into THE affected eye(s) THREE TIMES DAILY     Multiple Vitamins-Minerals (MULTIVITAMIN WITH MINERALS) tablet Take 1 tablet by mouth daily.     Nutritional Supplements (FEEDING SUPPLEMENT, OSMOLITE 1.5 CAL,) LIQD Give 6 cartons Osmolite 1.5 via tube split over 4 feedings/day. Flush tube with 60 ml water before and after each bolus feeding. Drink by mouth or give via tube additional 2.5 c fluids/day. This provides 2130 kcal, 89 grams protein, 2159 ml total water. Meets 100% needs.  0   ondansetron (ZOFRAN) 8 MG tablet Take 1 tablet (8 mg total) by mouth 2 (two) times daily as needed. Start on the third day after cisplatin chemotherapy. 30 tablet 1   simvastatin (ZOCOR) 20 MG tablet Take 20 mg by mouth daily.     tamsulosin (FLOMAX) 0.4 MG CAPS capsule Take 0.8 mg by mouth daily.     zinc gluconate 50 MG tablet Take 50 mg by mouth daily.     No current facility-administered medications for this visit.    ALLERGIES:  No Known Allergies  PHYSICAL EXAM:  Performance status (ECOG): 0 - Asymptomatic  There were no vitals filed for this visit.  Wt Readings from Last 3 Encounters:  01/11/22 145 lb (65.8 kg)  10/10/21 145 lb 6.4 oz (66 kg)  07/02/21 144 lb (65.3 kg)   Physical Exam Vitals reviewed.  Constitutional:      Appearance: Normal  appearance.  HENT:     Mouth/Throat:     Mouth: No oral lesions.  Cardiovascular:     Rate and Rhythm: Normal rate and regular rhythm.     Pulses: Normal pulses.     Heart sounds: Normal heart sounds.  Pulmonary:     Effort: Pulmonary effort is normal.     Breath sounds: Normal breath sounds.  Lymphadenopathy:     Cervical: No cervical adenopathy.     Right cervical: No superficial, deep or posterior cervical adenopathy.    Left cervical: No superficial, deep or posterior cervical adenopathy.     Upper Body:     Right upper body: No supraclavicular adenopathy.     Left upper body: No supraclavicular adenopathy.  Neurological:     General: No focal deficit present.     Mental Status: He is alert and oriented to person, place, and time.  Psychiatric:        Mood and Affect: Mood normal.        Behavior: Behavior normal.      LABORATORY DATA:  I have reviewed the labs as listed.     Latest Ref Rng & Units 04/11/2022    9:19 AM 10/03/2021    9:56 AM 03/14/2021    1:42 PM  CBC  WBC 4.0 - 10.5 K/uL 6.3  5.3  4.6   Hemoglobin 13.0 - 17.0 g/dL 13.6  12.1  11.5   Hematocrit 39.0 - 52.0 % 40.3  36.9  33.2   Platelets 150 - 400 K/uL 205  160  268       Latest Ref Rng & Units 04/11/2022    9:53 AM 04/11/2022    9:19 AM 10/03/2021    9:56 AM  CMP  Glucose 70 - 99 mg/dL  80  114   BUN 8 - 23 mg/dL  30  33   Creatinine 0.61 - 1.24 mg/dL 1.70  1.54  1.17   Sodium 135 - 145 mmol/L  137  139   Potassium 3.5 - 5.1 mmol/L  4.5  4.2   Chloride 98 - 111 mmol/L  106  107   CO2 22 - 32 mmol/L  26  26   Calcium 8.9 - 10.3 mg/dL  9.1  8.8   Total Protein 6.5 - 8.1 g/dL  6.4  6.2   Total Bilirubin 0.3 - 1.2 mg/dL  0.6  1.0   Alkaline Phos 38 - 126 U/L  63  64   AST 15 - 41 U/L  33  30   ALT 0 - 44 U/L  27  28     DIAGNOSTIC IMAGING:  I have independently reviewed the scans and discussed with the patient. CT SOFT TISSUE NECK W CONTRAST  Result Date: 04/12/2022 CLINICAL DATA:  Head and  neck cancer monitoring. Follow-up tonsil cancer. Status post chemotherapy. Diagnosis May 2022. EXAM: CT NECK WITH CONTRAST TECHNIQUE: Multidetector CT imaging of the neck was performed using the standard protocol following the bolus administration of intravenous contrast. RADIATION DOSE REDUCTION: This exam was performed according to the departmental dose-optimization program which includes automated exposure control, adjustment of the mA and/or kV according to patient size and/or use of iterative reconstruction technique. CONTRAST:  39m OMNIPAQUE IOHEXOL 300 MG/ML  SOLN COMPARISON:  CT 10/03/2021.  PET scan 08/02/2021.  CT 11/21/2020. FINDINGS: Pharynx and larynx: No evidence of residual or recurrent mass, with specific attention to the left tonsillar region. Salivary glands: Parotid and submandibular glands are normal. Thyroid: Normal. Some limitation due to streak artifact from the right shoulder. Lymph nodes: No adenopathy on either side of the neck. Vascular: Ordinary atherosclerotic calcification at the carotid bifurcations. Limited intracranial: Normal Visualized orbits: Normal Mastoids and visualized paranasal sinuses: Clear Skeleton: Chronic cervical spondylosis and facet arthropathy. Chronic fusion at C6-7. Upper chest: Negative Other: None IMPRESSION: 1. No evidence of residual or recurrent mass in the left tonsillar region. No adenopathy. 2. Chronic cervical spondylosis and facet arthropathy. Chronic fusion at C6-7. 3. Ordinary atherosclerotic calcification at the carotid bifurcations. Electronically Signed   By: MNelson ChimesM.D.   On: 04/12/2022 18:24     ASSESSMENT:  1.  Left tonsillar squamous cell cancer: - Left tonsillar biopsy by Dr. TBenjamine Molaon 11/15/2020 consistent with squamous cell carcinoma.  P16 is strongly positive. - PET scan on 12/07/2020-shows 4.1 cm left palatine tonsil mass with SUV 16.  0.8 cm left level 3 lymph node SUV 4.4.  Elongated 0.9 x 1 cm right lower lobe nodule along the  azygoesophageal recess has accentuated metabolic activity with SUV 5.8.  This has some solid component and was not visible on chest CT of 04/24/2020.  Differential includes unusual morphology of metastatic disease versus inflammatory process.  Groundglass opacity laterally in the right upper lobe likely inflammatory with SUV 1.4.  0.6 cm right middle lobe nodule not hypermetabolic. - Concurrent chemoradiation therapy from 01/09/2021 through 02/26/2021 - Pretreatment audiogram from Dr. TDeeann Saintoffice showed bilateral sensorineural hearing loss.  Hearing on the left side is slightly worse due to tumor.  2.  Social/family history: - He has a retired after working for [employer]  He quit smoking 12 years ago.  He smoked 2 packs/day for 20 years. - Sister had lung cancer.  2 sisters died of lung cancer.  Brother had lung cancer.   PLAN:  1.  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 - CT soft tissue neck (04/11/2022): No evidence of residual or recurrent mass and adenopathy. - Continue follow-up with Dr. Benjamine Mola in March 2024. - RTC 6 months for follow-up with repeat CT scan and labs.  2.  Elevated creatinine: - Creatinine was elevated at 1.7 on 04/11/2022. - We have repeated it today its 1.45.  He has cut back on diclofenac and is currently taking only 1-2 times per week.  3.  Peripheral neuropathy: - Continue gabapentin at bedtime.   Orders placed this encounter:  No orders of the defined types were placed in this encounter.    Derek Edman, MD [place] [phone]

## 2022-04-19 ENCOUNTER — Encounter (HOSPITAL_COMMUNITY): Payer: Self-pay | Admitting: Hematology and Oncology

## 2022-05-17 DIAGNOSIS — M65341 Trigger finger, right ring finger: Secondary | ICD-10-CM | POA: Insufficient documentation

## 2022-07-04 ENCOUNTER — Other Ambulatory Visit: Payer: Self-pay | Admitting: Hematology

## 2022-09-19 IMAGING — PT NM PET TUM IMG INITIAL (PI) SKULL BASE T - THIGH
1 of 6 series · 1 of 25 positions shown · non-contrast
Comparison: CT neck 11/21/2020

CLINICAL DATA: Initial treatment strategy for tonsillar cancer.

EXAM:
NUCLEAR MEDICINE PET SKULL BASE TO THIGH
TECHNIQUE: 6.7 mCi F-18 FDG was injected intravenously. Full-ring PET imaging
was performed from the skull base to thigh after the radiotracer. CT
data was obtained and used for attenuation correction and anatomic
localization.
Fasting blood glucose: 103 mg/dl

[Series 4: pet ac · axial · 3.0mm · 4.11mm/px · 1 of 323 slices shown]
[im 162/323]
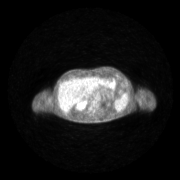

[1 of 25 positions shown; findings below may reference images not displayed]

FINDINGS: Mediastinal blood pool activity: SUV max

Liver activity: SUV max

NECK: The large left palatine tonsillar mass has maximum SUV of
16.1, with hypermetabolic activity measuring about 4.1 cm in long
axis.

Left level III lymph node measuring 0.8 cm in short axis on image 78
series 3 has maximum SUV of 4.4, favoring malignant involvement.

Incidental CT findings: Mild bilateral common carotid
atherosclerotic calcification.

CHEST: Ill-defined 1.0 by 0.9 cm nodule possibly with sub solid
elements in the right lower lobe along the azygoesophageal recess on
image 131 series 3, maximum SUV 5.8 suspicious for either malignancy
or active granulomatous process. This has a somewhat unusual
elongated morphology vertically, and was not readily apparent on
04/24/2020 chest CT.

Ill-defined ground-glass density peripherally in the right upper
lobe measuring about 1.9 by 1.6 cm on image 111 series 3, new
compared to 04/24/2020, maximum SUV 1.4. Likely benign/inflammatory.

0.6 cm nodule in the right middle lobe near the minor fissure on
image 132 series 3, maximum SUV 1.1, nonspecific. This nodule has
increased conspicuity compared to 04/24/2020.

Incidental CT findings: Coronary, aortic arch, and branch vessel
atherosclerotic vascular disease.

ABDOMEN/PELVIS: No significant abnormal hypermetabolic activity in
this region.

Incidental CT findings: Aortoiliac atherosclerotic vascular disease.
Ventral hernia mesh noted. Stable high density along the lateral
capsular margin of the left kidney on image 178 series 3.

SKELETON: Accentuated activity along the postoperative findings of
the right distal acromion, thought to be benign. Accentuated
activity along the carpometacarpal articulations laterally in the
wrist, thought to be degenerative.

Incidental CT findings: Cervical, thoracic, and lumbar spondylosis.
Suspected postoperative findings eccentric to the right at C5-C6-C7.
Fusion at C6-7.
IMPRESSION: 1. 4.1 cm left palatine tonsillar mass has a maximum SUV of 16.1,
compatible with malignancy.
2. A 0.8 cm left level III lymph node has maximum SUV of 4.4,
compatible with malignant involvement.
3. A somewhat vertically elongated 0 point 9 by 1.0 cm right lower
lobe nodule along the azygoesophageal recess has accentuated
metabolic activity with maximum SUV 5.8. This has sub solid
components was not visible on the chest CT of 04/24/2020.
Possibilities include unusual morphology of metastatic disease
versus an inflammatory process.
4. There is also a ground-glass opacity laterally in the right upper
lobe which is likely inflammatory and with maximum SUV of only 1.4.
5. 0.6 cm right middle lobe nodule is not hypermetabolic and is
nonspecific. Surveillance suggested.
6. Other imaging findings of potential clinical significance: Aortic
Atherosclerosis ([id number]). Coronary atherosclerosis.
Spondylosis.

## 2022-09-23 IMAGING — XA IR PERC PLACEMENT GASTROSTOMY
1 series · 1 of 1 positions shown · non-contrast
Comparison: none

INDICATION: 75-year-old male referred for gastrostomy tube placement with a
history of head and neck carcinoma

[Series 1: fl (-) angio · 1 of 1 slices shown]
[im 1/1]
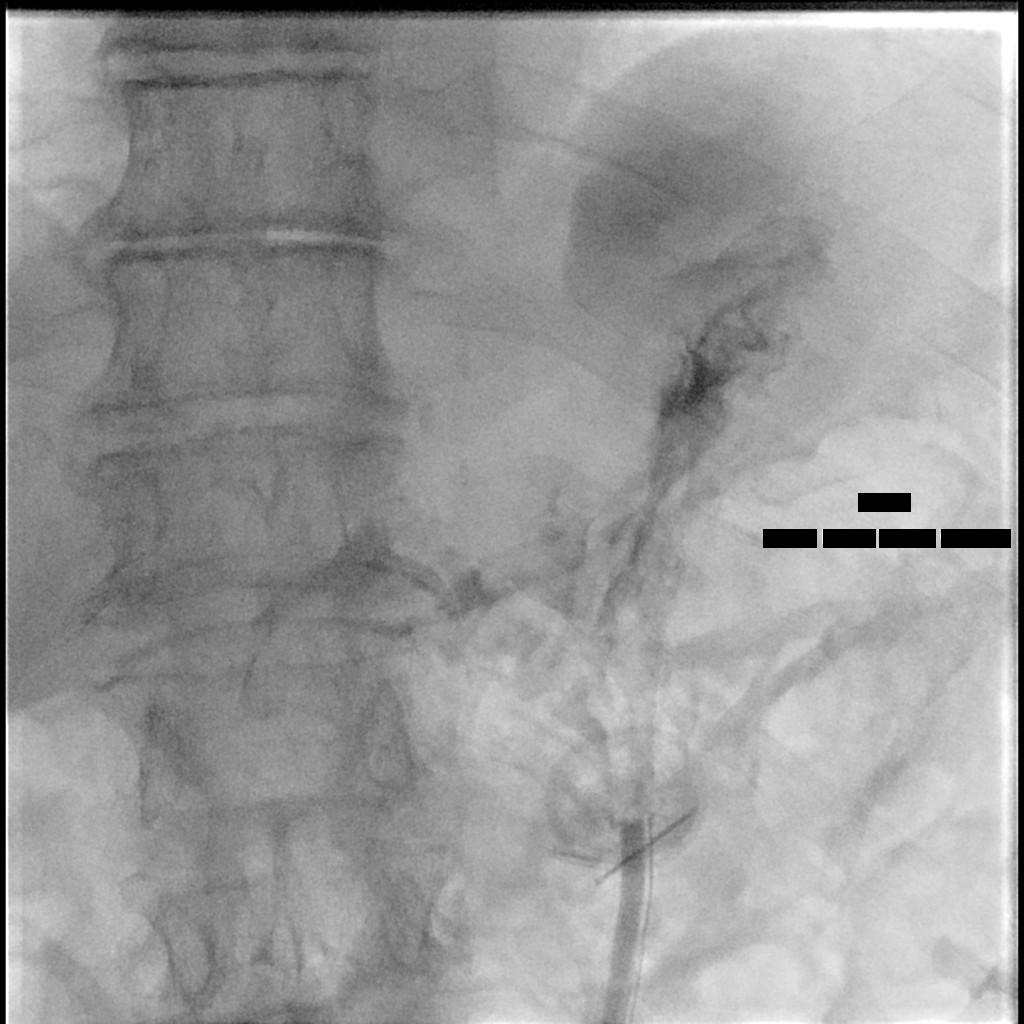

[1 of 1 positions shown; findings below may reference images not displayed]

EXAM:
PERC PLACEMENT GASTROSTOMY

MEDICATIONS:
2 g Ancef; Antibiotics were administered within 1 hour of the
procedure.

ANESTHESIA/SEDATION:
Versed 1.0 mg IV; Fentanyl 50 mcg IV

Moderate Sedation Time:  10 minutes

The patient was continuously monitored during the procedure by the
interventional radiology nurse under my direct supervision.

CONTRAST:  10 cc-administered into the gastric lumen.

FLUOROSCOPY TIME:  Fluoroscopy Time: 1 minutes 36 seconds (6 mGy).

COMPLICATIONS:
None

PROCEDURE:
Informed written consent was obtained from the patient and the
patient's family after a thorough discussion of the procedural
risks, benefits and alternatives. All questions were addressed.
Maximal Sterile Barrier Technique was utilized including caps, mask,
sterile gowns, sterile gloves, sterile drape, hand hygiene and skin
antiseptic. A timeout was performed prior to the initiation of the
procedure.

The epigastrium was prepped with Betadine in a sterile fashion, and
a sterile drape was applied covering the operative field. A sterile
gown and sterile gloves were used for the procedure.

A 5-French orogastric tube is placed under fluoroscopic guidance.
Scout imaging of the abdomen confirms barium within the transverse
colon.

The stomach was distended with gas. Under fluoroscopic guidance, an
18 gauge needle was utilized to puncture the anterior wall of the
body of the stomach. An Amplatz wire was advanced through the needle
passing a T fastener into the lumen of the stomach. The T fastener
was secured for gastropexy. A 9-French sheath was inserted.

A snare was advanced through the 9-French sheath. Pratima Naumann was
advanced through the orogastric tube. It was snared then pulled out
the oral cavity, pulling the snare, as well. The leading edge of the
gastrostomy was attached to the snare. It was then pulled down the
esophagus and out the percutaneous site. Tube secured in place.
Contrast was injected.

Patient tolerated the procedure well and remained hemodynamically
stable throughout.

No complications were encountered and no significant blood loss
encountered.
IMPRESSION: Status post fluoroscopic placed percutaneous gastrostomy tube, with
20 French pull-through.

## 2022-09-23 IMAGING — US IR IMAGING GUIDED PORT INSERTION
1 series · 2 of 2 positions shown · non-contrast
Comparison: none

INDICATION: 75-year-old male with a history of head neck cancer referred for
port catheter placement

[Series 1: ir imaging guided port insertion · 2 of 2 slices shown]
[im 1/2]
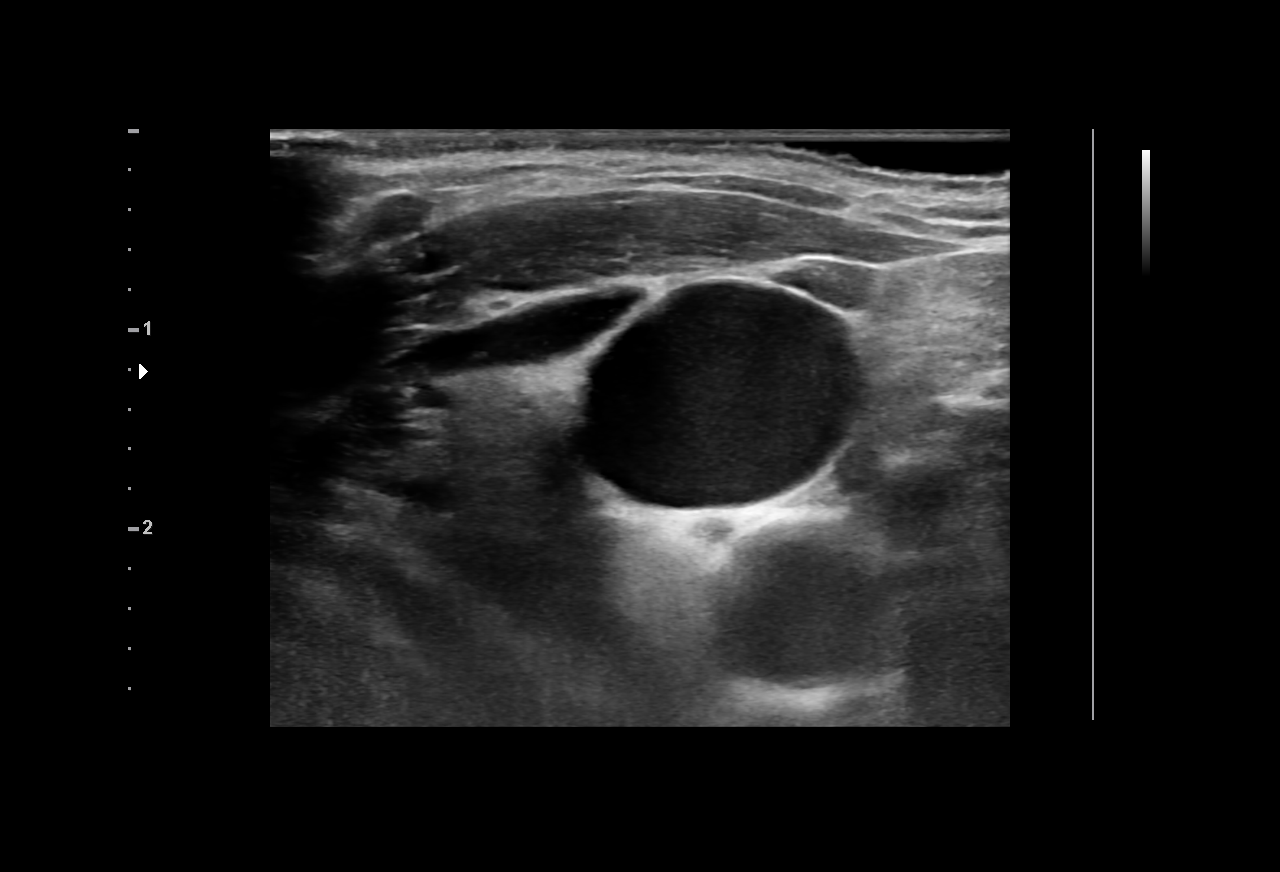
[im 2/2]
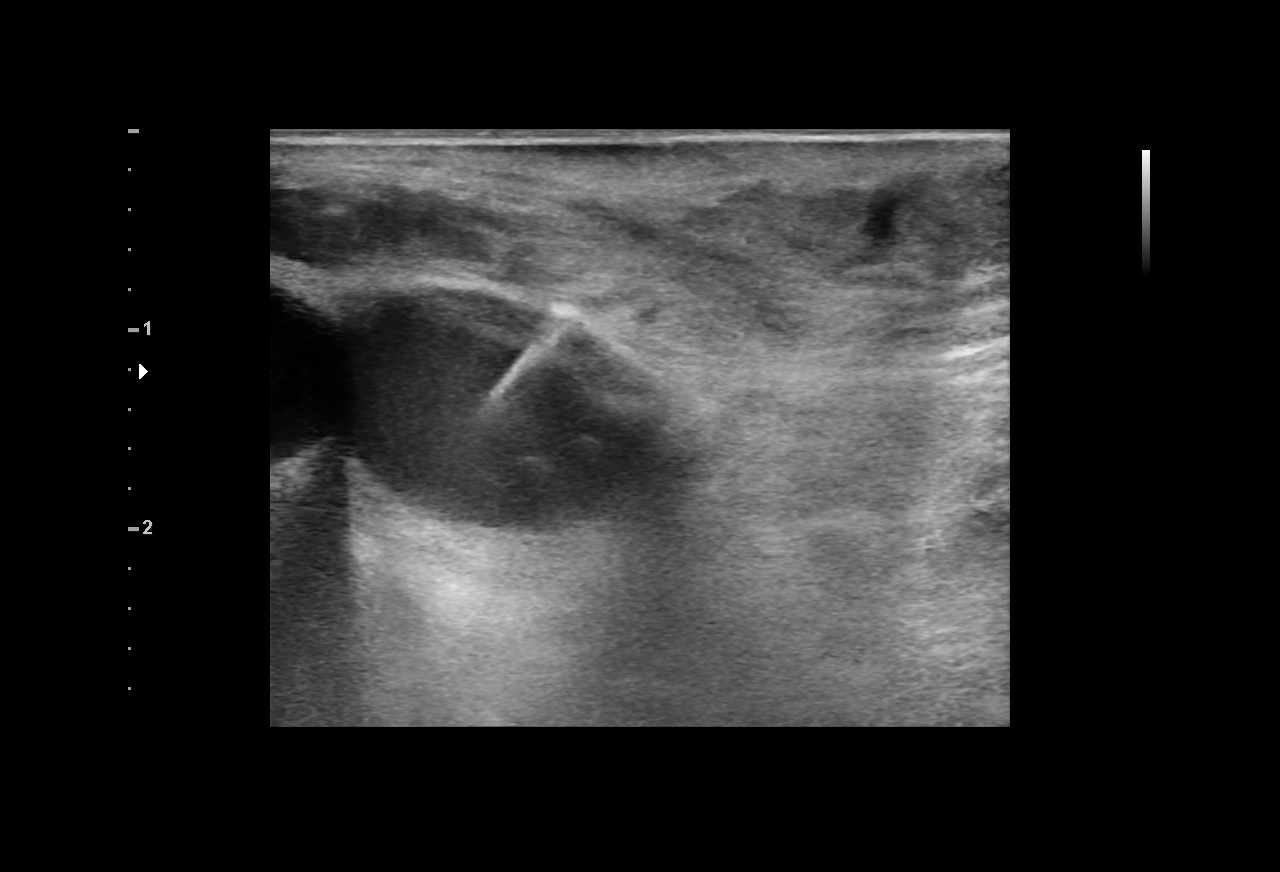

[2 of 2 positions shown; findings below may reference images not displayed]

EXAM:
IMAGE GUIDED PORT PLACEMENT

MEDICATIONS:
None

ANESTHESIA/SEDATION:
Moderate (conscious) sedation was employed during this procedure. A
total of Versed 2.0 mg and Fentanyl 50 mcg was administered
intravenously.

Moderate Sedation Time: 15 minutes. The patient's level of
consciousness and vital signs were monitored continuously by
radiology nursing throughout the procedure under my direct
supervision.

FLUOROSCOPY TIME:  0 minutes, 6 seconds (0.2 mGy)

COMPLICATIONS:
None

PROCEDURE:
The procedure, risks, benefits, and alternatives were explained to
the patient. Questions regarding the procedure were encouraged and
answered. The patient understands and consents to the procedure.

Ultrasound survey was performed with images stored and sent to PACs.

The right neck and chest was prepped with chlorhexidine, and draped
in the usual sterile fashion using maximum barrier technique (cap
and mask, sterile gown, sterile gloves, large sterile sheet, hand
hygiene and cutaneous antiseptic). . Local anesthesia was attained
by infiltration with 1% lidocaine without epinephrine.

Ultrasound demonstrated patency of the right internal jugular vein,
and this was documented with an image. Under real-time ultrasound
guidance, this vein was accessed with a 21 gauge micropuncture
needle and image documentation was performed. A small dermatotomy
was made at the access site with an 11 scalpel. A 0.018" wire was
advanced into the SVC and used to estimate the length of the
internal catheter. The access needle exchanged for a 4F
micropuncture vascular sheath. The 0.018" wire was then removed and
a 0.035" wire advanced into the IVC.



The venous access site was then serially dilated and a peel away
vascular sheath placed over the wire. The wire was removed and the
port catheter advanced into position under fluoroscopic guidance.
The catheter tip is positioned in the cavoatrial junction. This was
documented with a spot image. The portacatheter was then tested and
found to flush and aspirate well. The port was flushed with saline
followed by 100 units/mL heparinized saline.

The pocket was then closed in two layers using first subdermal
inverted interrupted absorbable sutures followed by a running
subcuticular suture. The epidermis was then sealed with Dermabond.
The dermatotomy at the venous access site was also seal with
Dermabond.

Patient tolerated the procedure well and remained hemodynamically
stable throughout.

No complications encountered and no significant blood loss
encountered
IMPRESSION: Status post right IJ port catheter

## 2022-09-24 DIAGNOSIS — M19041 Primary osteoarthritis, right hand: Secondary | ICD-10-CM | POA: Insufficient documentation

## 2022-10-14 ENCOUNTER — Inpatient Hospital Stay: Payer: Medicare HMO | Attending: Hematology

## 2022-10-14 ENCOUNTER — Ambulatory Visit (HOSPITAL_COMMUNITY)
Admission: RE | Admit: 2022-10-14 | Discharge: 2022-10-14 | Disposition: A | Discharge status: Home / Self Care | Payer: Medicare HMO | Source: Ambulatory Visit | Attending: Hematology | Admitting: Hematology

## 2022-10-14 DIAGNOSIS — Z9221 Personal history of antineoplastic chemotherapy: Secondary | ICD-10-CM | POA: Insufficient documentation

## 2022-10-14 DIAGNOSIS — C099 Malignant neoplasm of tonsil, unspecified: Secondary | ICD-10-CM | POA: Diagnosis not present

## 2022-10-14 DIAGNOSIS — R7989 Other specified abnormal findings of blood chemistry: Secondary | ICD-10-CM | POA: Insufficient documentation

## 2022-10-14 DIAGNOSIS — Z85818 Personal history of malignant neoplasm of other sites of lip, oral cavity, and pharynx: Secondary | ICD-10-CM | POA: Insufficient documentation

## 2022-10-14 DIAGNOSIS — Z801 Family history of malignant neoplasm of trachea, bronchus and lung: Secondary | ICD-10-CM | POA: Insufficient documentation

## 2022-10-14 DIAGNOSIS — Z87891 Personal history of nicotine dependence: Secondary | ICD-10-CM | POA: Insufficient documentation

## 2022-10-14 DIAGNOSIS — G629 Polyneuropathy, unspecified: Secondary | ICD-10-CM | POA: Insufficient documentation

## 2022-10-14 DIAGNOSIS — Z79899 Other long term (current) drug therapy: Secondary | ICD-10-CM | POA: Insufficient documentation

## 2022-10-14 DIAGNOSIS — R1310 Dysphagia, unspecified: Secondary | ICD-10-CM | POA: Insufficient documentation

## 2022-10-14 DIAGNOSIS — Z905 Acquired absence of kidney: Secondary | ICD-10-CM | POA: Insufficient documentation

## 2022-10-14 LAB — COMPREHENSIVE METABOLIC PANEL
ALT: 29 U/L (ref 0–44)
AST: 30 U/L (ref 15–41)
Albumin: 3.8 g/dL (ref 3.5–5.0)
Alkaline Phosphatase: 55 U/L (ref 38–126)
Anion gap: 7 (ref 5–15)
BUN: 31 mg/dL — ABNORMAL HIGH (ref 8–23)
CO2: 26 mmol/L (ref 22–32)
Calcium: 8.8 mg/dL — ABNORMAL LOW (ref 8.9–10.3)
Chloride: 103 mmol/L (ref 98–111)
Creatinine, Ser: 1.34 mg/dL — ABNORMAL HIGH (ref 0.61–1.24)
GFR, Estimated: 55 mL/min — ABNORMAL LOW (ref >60)
Glucose, Bld: 102 mg/dL — ABNORMAL HIGH (ref 70–99)
Potassium: 4.3 mmol/L (ref 3.5–5.1)
Sodium: 136 mmol/L (ref 135–145)
Total Bilirubin: 1.1 mg/dL (ref 0.3–1.2)
Total Protein: 6.2 g/dL — ABNORMAL LOW (ref 6.5–8.1)

## 2022-10-14 LAB — CBC WITH DIFFERENTIAL/PLATELET
Abs Immature Granulocytes: 0.04 10*3/uL (ref 0.00–0.07)
Basophils Absolute: 0.1 10*3/uL (ref 0.0–0.1)
Basophils Relative: 1 %
Eosinophils Absolute: 0.3 10*3/uL (ref 0.0–0.5)
Eosinophils Relative: 3 %
HCT: 40.9 % (ref 39.0–52.0)
Hemoglobin: 13.4 g/dL (ref 13.0–17.0)
Immature Granulocytes: 0 %
Lymphocytes Relative: 15 %
Lymphs Abs: 1.5 10*3/uL (ref 0.7–4.0)
MCH: 33.3 pg (ref 26.0–34.0)
MCHC: 32.8 g/dL (ref 30.0–36.0)
MCV: 101.5 fL — ABNORMAL HIGH (ref 80.0–100.0)
Monocytes Absolute: 1 10*3/uL (ref 0.1–1.0)
Monocytes Relative: 9 %
Neutro Abs: 7.3 10*3/uL (ref 1.7–7.7)
Neutrophils Relative %: 72 %
Platelets: 193 10*3/uL (ref 150–400)
RBC: 4.03 MIL/uL — ABNORMAL LOW (ref 4.22–5.81)
RDW: 13 % (ref 11.5–15.5)
WBC: 10.2 10*3/uL (ref 4.0–10.5)
nRBC: 0 % (ref 0.0–0.2)

## 2022-10-14 LAB — POCT I-STAT CREATININE: Creatinine, Ser: 1.5 mg/dL — ABNORMAL HIGH (ref 0.61–1.24)

## 2022-10-14 LAB — TSH: TSH: 2.636 u[IU]/mL (ref 0.350–4.500)

## 2022-10-14 MED ORDER — IOHEXOL 300 MG/ML  SOLN
100.0000 mL | Freq: Once | INTRAMUSCULAR | Status: AC | PRN
  Administered 2022-10-14: 100 mL via INTRAVENOUS

## 2022-10-21 NOTE — Progress Notes (Signed)
[name] [address], [place] [address]    Clinic Day:  10/22/2022  Referring physician: Shelby Dubin, FNP  Patient Care Team: Shelby Dubin, FNP as PCP - General (Family Medicine) Therese Sarah, RN as Oncology Nurse Navigator (Oncology)   ASSESSMENT & PLAN:   Assessment: 1.  Left tonsillar squamous cell cancer: - Left tonsillar biopsy by Dr. Suszanne Conners on 11/15/2020 consistent with squamous cell carcinoma.  P16 is strongly positive. - PET scan on 12/07/2020-shows 4.1 cm left palatine tonsil mass with SUV 16.  0.8 cm left level 3 lymph node SUV 4.4.  Elongated 0.9 x 1 cm right lower lobe nodule along the azygoesophageal recess has accentuated metabolic activity with SUV 5.8.  This has some solid component and was not visible on chest CT of 04/24/2020.  Differential includes unusual morphology of metastatic disease versus inflammatory process.  Groundglass opacity laterally in the right upper lobe likely inflammatory with SUV 1.4.  0.6 cm right middle lobe nodule not hypermetabolic. - Concurrent chemoradiation therapy from 01/09/2021 through 02/26/2021 - Pretreatment audiogram from Dr. Avel Sensor office showed bilateral sensorineural hearing loss.  Hearing on the left side is slightly worse due to tumor.  2.  Social/family history: - He has a retired after working for [employer].  He quit smoking 12 years ago.  He smoked 2 packs/day for 20 years. - Sister had lung cancer.  2 sisters died of lung cancer.  Brother had lung cancer.    Plan: 1.  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 - Continues to have some dysphagia to certain foods like breads. - Oropharynx exam: No masses seen in general and left side of the tongue in particular.  No palpable adenopathy. - CT soft tissue neck on 10/14/2022: Normal masslike ill-defined hyperenhancement in the left oral tongue/glossotonsillar sulcus is similar to the most recent study.  No  lymphadenopathy. - Labs from 10/14/2022: Normal LFTs.  CBC grossly normal. - RTC 6 months for follow-up with repeat scan and labs. - His port does not give blood return.  He wants to discontinue it.  Will make referral.  2.  Elevated creatinine: - Creatinine is down to his baseline of 1.34.  3.  Peripheral neuropathy: - Continue gabapentin at bedtime.    Orders Placed This Encounter  Procedures   CT SOFT TISSUE NECK W CONTRAST    Standing Status:   Future    Standing Expiration Date:   10/22/2023    Order Specific Question:   If indicated for the ordered procedure, I authorize the administration of contrast media per Radiology protocol    Answer:   Yes    Order Specific Question:   Does the patient have a contrast media/X-ray dye allergy?    Answer:   No    Order Specific Question:   Preferred imaging location?    Answer:   [facility]      I,Katie Daubenspeck,acting as a scribe for Doreatha Massed, MD.,have documented all relevant documentation on the behalf of Doreatha Massed, MD,as directed by  Doreatha Massed, MD while in the presence of Doreatha Massed, MD.   I, Doreatha Massed MD, have reviewed the above documentation for accuracy and completeness, and I agree with the above.   Doreatha Massed, MD   6/18/202412:24 PM  CHIEF COMPLAINT:   Diagnosis: left tonsil cancer    Cancer Staging  Tonsil cancer [facility]) Staging form: Pharynx - HPV-Mediated Oropharynx,  AJCC 8th Edition - Clinical stage from 11/29/2020: cT4, p16+ - Signed by Artis Delay, MD on 11/29/2020    Prior Therapy: 1. Partial nephrectomy 12 years ago 2. Cisplatin weekly  Current Therapy:  surveillance    HISTORY OF PRESENT ILLNESS:   Oncology History  Tonsil cancer (HCC)  09/04/2020 Initial Diagnosis   He has been complaining of left sided ear pain, odynphagia and dysphagia   11/15/2020 Pathology Results   [id number] Tonsil biopsy: squamous cell carcinoma P16 strongly  positive   11/15/2020 Procedure   He underwent tonsil biopsy   11/22/2020 Imaging   CT neck 1. 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 There is preferential submucosal growth and the mass is indistinguishable from the left prevertebral space and lower left stylopharyngeaus. No convincing adenopathy. 2. Mixed density right upper lobe nodule, attention on anticipated PET.   11/29/2020 Initial Diagnosis   Tonsil cancer (HCC)   11/29/2020 Cancer Staging   Staging form: Pharynx - HPV-Mediated Oropharynx, AJCC 8th Edition - Clinical stage from 11/29/2020: cT4, p16+ - Signed by Artis Delay, MD on 11/29/2020 Stage prefix: Initial diagnosis   01/09/2021 - 02/20/2021 Chemotherapy   Patient is on Treatment Plan : HEAD/NECK Cisplatin q7d        INTERVAL HISTORY:   Douglas [name] is a 77 y.o. male presenting to clinic today for follow up of left tonsil cancer. He was last seen by me on 04/19/22.  Since his last visit, he underwent surveillance neck CT on 10/14/22.   Today, he states that he is doing well overall. His appetite level is at 100%. His energy level is at 100%.  PAST MEDICAL HISTORY:   Past Medical History: Past Medical History:  Diagnosis Date   Arthritis    Cancer (HCC)    Skin, and Kidney   Hypertension    Pre-diabetes    Skin cancer     Surgical History: Past Surgical History:  Procedure Laterality Date   COLONOSCOPY     HERNIA REPAIR     IR GASTROSTOMY TUBE MOD SED  12/11/2020   IR GASTROSTOMY TUBE REMOVAL  04/25/2021   IR IMAGING GUIDED PORT INSERTION  12/11/2020   LAPAROSCOPIC PARTIAL NEPHRECTOMY Left    PARTIAL NEPHRECTOMY Left 2009   REVERSE SHOULDER ARTHROPLASTY Right 09/10/2019   Procedure: REVERSE SHOULDER ARTHROPLASTY;  Surgeon: Beverely Low, MD;  Location: [facility];  Service: Orthopedics;  Laterality: Right;  interscalene block   SKIN SURGERY      Social History: Social History   Socioeconomic History   Marital status: Widowed     Spouse name: Not on file   Number of children: Not on file   Years of education: Not on file   Highest education level: Not on file  Occupational History    Employer: [employer]  Tobacco Use   Smoking status: Former    Packs/day: 1.00    Years: 20.00    Additional pack years: 0.00    Total pack years: 20.00    Types: Cigarettes    Quit date: 2010    Years since quitting: 14.4   Smokeless tobacco: Never  Vaping Use   Vaping Use: Never used  Substance and Sexual Activity   Alcohol use: Not Currently    Alcohol/week: 1.0 standard drink of alcohol    Types: 1 Cans of beer per week    Comment: occ   Drug use: Never   Sexual activity: Not on file  Other Topics Concern   Not  on file  Social History Narrative   Right and Left Handed.      Lives in a one story home    Drinks Caffeine    Social Determinants of Health   Financial Resource Strain: Low Risk  (11/29/2020)   Overall Financial Resource Strain (CARDIA)    Difficulty of Paying Living Expenses: Not hard at all  Food Insecurity: No Food Insecurity (11/29/2020)   Hunger Vital Sign    Worried About Running Out of Food in the Last Year: Never true    Ran Out of Food in the Last Year: Never true  Transportation Needs: No Transportation Needs (11/29/2020)   PRAPARE - Administrator, Civil Service (Medical): No    Lack of Transportation (Non-Medical): No  Physical Activity: Sufficiently Active (11/29/2020)   Exercise Vital Sign    Days of Exercise per Week: 7 days    Minutes of Exercise per Session: 30 min  Stress: No Stress Concern Present (11/29/2020)   [employer] of Occupational Health - Occupational Stress Questionnaire    Feeling of Stress : Not at all  Social Connections: Socially Isolated (11/29/2020)   Social Connection and Isolation Panel [NHANES]    Frequency of Communication with Friends and Family: More than three times a week    Frequency of Social Gatherings with Friends and Family: More than  three times a week    Attends Religious Services: Never    Database administrator or Organizations: No    Attends Banker Meetings: Never    Marital Status: Widowed  Intimate Partner Violence: Not At Risk (11/29/2020)   Humiliation, Afraid, Rape, and Kick questionnaire    Fear of Current or Ex-Partner: No    Emotionally Abused: No    Physically Abused: No    Sexually Abused: No    Family History: Family History  Problem Relation Age of Onset   Diabetes Mother    Pulmonary embolism Father    Lung cancer Sister    Lung cancer Sister    Diabetes Brother    Lung cancer Brother     Current Medications:  Current Outpatient Medications:    acetaminophen-codeine (TYLENOL #3) 300-30 MG tablet, , Disp: , Rfl:    atenolol (TENORMIN) 25 MG tablet, Take 25 mg by mouth daily., Disp: , Rfl:    cholecalciferol (VITAMIN D3) 25 MCG (1000 UNIT) tablet, Take 1,000 Units by mouth daily., Disp: , Rfl:    fluticasone (FLONASE) 50 MCG/ACT nasal spray, Place into both nostrils., Disp: , Rfl:    gabapentin (NEURONTIN) 300 MG capsule, TAKE ONE CAPSULE BY MOUTH AT BEDTIME, Disp: 30 capsule, Rfl: 6   lidocaine-prilocaine (EMLA) cream, Apply to affected area once, Disp: 30 g, Rfl: 3   lisinopril (ZESTRIL) 40 MG tablet, Take 40 mg by mouth daily., Disp: , Rfl:    meclizine (ANTIVERT) 25 MG tablet, , Disp: , Rfl:    Menaquinone-7 (VITAMIN K2 PO), Take 1 tablet by mouth daily., Disp: , Rfl:    moxifloxacin (VIGAMOX) 0.5 % ophthalmic solution, USE 1 DROP into THE affected eye(s) THREE TIMES DAILY, Disp: , Rfl:    Multiple Vitamins-Minerals (MULTIVITAMIN WITH MINERALS) tablet, Take 1 tablet by mouth daily., Disp: , Rfl:    Nutritional Supplements (FEEDING SUPPLEMENT, OSMOLITE 1.5 CAL,) LIQD, Give 6 cartons Osmolite 1.5 via tube split over 4 feedings/day. Flush tube with 60 ml water before and after each bolus feeding. Drink by mouth or give via tube additional 2.5 c fluids/day. This  provides 2130  kcal, 89 grams protein, 2159 ml total water. Meets 100% needs., Disp: , Rfl: 0   simvastatin (ZOCOR) 20 MG tablet, Take 20 mg by mouth daily., Disp: , Rfl:    tamsulosin (FLOMAX) 0.4 MG CAPS capsule, Take 0.8 mg by mouth daily., Disp: , Rfl:    zinc gluconate 50 MG tablet, Take 50 mg by mouth daily., Disp: , Rfl:    ondansetron (ZOFRAN) 8 MG tablet, Take 1 tablet (8 mg total) by mouth 2 (two) times daily as needed. Start on the third day after cisplatin chemotherapy. (Patient not taking: Reported on 10/22/2022), Disp: 30 tablet, Rfl: 1   Allergies: No Known Allergies  REVIEW OF SYSTEMS:   Review of Systems  Constitutional:  Negative for chills, fatigue and fever.  HENT:   Positive for trouble swallowing. Negative for lump/mass, mouth sores, nosebleeds and sore throat.   Eyes:  Negative for eye problems.  Respiratory:  Negative for cough and shortness of breath.   Cardiovascular:  Negative for chest pain, leg swelling and palpitations.  Gastrointestinal:  Negative for abdominal pain, constipation, diarrhea, nausea and vomiting.  Genitourinary:  Negative for bladder incontinence, difficulty urinating, dysuria, frequency, hematuria and nocturia.   Musculoskeletal:  Negative for arthralgias, back pain, flank pain, myalgias and neck pain.  Skin:  Negative for itching and rash.  Neurological:  Positive for numbness. Negative for dizziness and headaches.  Hematological:  Does not bruise/bleed easily.  Psychiatric/Behavioral:  Negative for depression, sleep disturbance and suicidal ideas. The patient is not nervous/anxious.   All other systems reviewed and are negative.    VITALS:   Blood pressure (!) 149/67, pulse (!) 55, temperature 97.8 F (36.6 C), temperature source Tympanic, resp. rate 18, weight 145 lb 6.4 oz (66 kg), SpO2 95 %.  Wt Readings from Last 3 Encounters:  10/22/22 145 lb 6.4 oz (66 kg)  04/18/22 152 lb 14.4 oz (69.4 kg)  01/11/22 145 lb (65.8 kg)    Body mass index is  22.77 kg/m.  Performance status (ECOG): 1 - Symptomatic but completely ambulatory  PHYSICAL EXAM:   Physical Exam Vitals and nursing note reviewed. Exam conducted with a chaperone present.  Constitutional:      Appearance: Normal appearance.  Cardiovascular:     Rate and Rhythm: Normal rate and regular rhythm.     Pulses: Normal pulses.     Heart sounds: Normal heart sounds.  Pulmonary:     Effort: Pulmonary effort is normal.     Breath sounds: Normal breath sounds.  Abdominal:     Palpations: Abdomen is soft. There is no hepatomegaly, splenomegaly or mass.     Tenderness: There is no abdominal tenderness.  Musculoskeletal:     Right lower leg: No edema.     Left lower leg: No edema.  Lymphadenopathy:     Cervical: No cervical adenopathy.     Right cervical: No superficial, deep or posterior cervical adenopathy.    Left cervical: No superficial, deep or posterior cervical adenopathy.     Upper Body:     Right upper body: No supraclavicular or axillary adenopathy.     Left upper body: No supraclavicular or axillary adenopathy.  Neurological:     General: No focal deficit present.     Mental Status: He is alert and oriented to person, place, and time.  Psychiatric:        Mood and Affect: Mood normal.        Behavior: Behavior normal.  LABS:      Latest Ref Rng & Units 10/14/2022    1:01 PM 04/18/2022    9:11 AM 04/11/2022    9:19 AM  CBC  WBC 4.0 - 10.5 K/uL 10.2  7.7  6.3   Hemoglobin 13.0 - 17.0 g/dL 40.9  81.1  91.4   Hematocrit 39.0 - 52.0 % 40.9  40.7  40.3   Platelets 150 - 400 K/uL 193  205  205       Latest Ref Rng & Units 10/14/2022    2:02 PM 10/14/2022    1:01 PM 04/18/2022    9:11 AM  CMP  Glucose 70 - 99 mg/dL  782  97   BUN 8 - 23 mg/dL  31  25   Creatinine 9.56 - 1.24 mg/dL 2.13  0.86  5.78   Sodium 135 - 145 mmol/L  136  137   Potassium 3.5 - 5.1 mmol/L  4.3  4.6   Chloride 98 - 111 mmol/L  103  103   CO2 22 - 32 mmol/L  26  29   Calcium  8.9 - 10.3 mg/dL  8.8  9.3   Total Protein 6.5 - 8.1 g/dL  6.2  6.9   Total Bilirubin 0.3 - 1.2 mg/dL  1.1  0.9   Alkaline Phos 38 - 126 U/L  55  65   AST 15 - 41 U/L  30  31   ALT 0 - 44 U/L  29  29      No results found for: "CEA1", "CEA" / No results found for: "CEA1", "CEA" No results found for: "PSA1" No results found for: "ION629" No results found for: "CAN125"  No results found for: "TOTALPROTELP", "ALBUMINELP", "A1GS", "A2GS", "BETS", "BETA2SER", "GAMS", "MSPIKE", "SPEI" Lab Results  Component Value Date   TIBC 364 04/11/2022   FERRITIN 62 04/11/2022   IRONPCTSAT 30 04/11/2022   No results found for: "LDH"   STUDIES:   CT SOFT TISSUE NECK W CONTRAST  Result Date: 10/22/2022 CLINICAL DATA:  History of left tonsil cancer completed treatment in 2022. EXAM: CT NECK WITH CONTRAST TECHNIQUE: Multidetector CT imaging of the neck was performed using the standard protocol following the bolus administration of intravenous contrast. RADIATION DOSE REDUCTION: This exam was performed according to the departmental dose-optimization program which includes automated exposure control, adjustment of the mA and/or kV according to patient size and/or use of iterative reconstruction technique. CONTRAST:  OMNIPAQUE IOHEXOL 300 MG/ML  SOLN COMPARISON:  CT neck 04/11/2022 FINDINGS: Pharynx and larynx: The nasal cavity and nasopharynx are unremarkable. There is ill-defined mild hyperenhancement in the region of the left oral tongue/glossotonsillar sulcus (6-43, 4-58). The appearance is similar to most recent study from 04/11/2022 but is increased in conspicuity since 10/03/2021. There is no mass effect. The oral cavity and oropharynx are otherwise unremarkable. The parapharyngeal spaces are clear. The hypopharynx and larynx are unremarkable. The vocal folds are normal in appearance. The epiglottis is normal. There is no retropharyngeal fluid collection. The airway is patent. Salivary glands: The  parotid glands are unremarkable. There is atrophy of the submandibular glands, unchanged. Thyroid: Unremarkable. Lymph nodes: There is no pathologic lymphadenopathy in the neck. Vascular: There is calcified plaque at the carotid bifurcations. The major vasculature of the neck is otherwise unremarkable. Limited intracranial: Unremarkable. Visualized orbits: Unremarkable. Mastoids and visualized paranasal sinuses: Clear. Skeleton: There is no acute osseous abnormality or suspicious osseous lesion. Postsurgical changes are noted in the shoulder and scapula on the  right. There is advanced degenerative change in the cervical spine. Upper chest: The imaged lung apices are clear. A right chest wall port is partially imaged. Other: None. IMPRESSION: 1. Non-masslike ill-defined hyperenhancement in the left oral tongue/glossotonsillar sulcus is similar to the most recent study but is slightly increased in conspicuity compared to the study from 10/03/2021. NIRADS 2a, recommend direct inspection. 2. No pathologic lymphadenopathy in the neck. Electronically Signed   By: Lesia Hausen M.D.   On: 10/22/2022 08:23

## 2022-10-22 ENCOUNTER — Other Ambulatory Visit: Payer: Self-pay | Admitting: *Deleted

## 2022-10-22 ENCOUNTER — Inpatient Hospital Stay: Payer: Medicare HMO | Admitting: Hematology

## 2022-10-22 VITALS — BP 149/67 | HR 55 | Temp 97.8°F | Resp 18 | Wt 145.4 lb

## 2022-10-22 DIAGNOSIS — C099 Malignant neoplasm of tonsil, unspecified: Secondary | ICD-10-CM | POA: Diagnosis not present

## 2022-10-22 DIAGNOSIS — Z905 Acquired absence of kidney: Secondary | ICD-10-CM | POA: Diagnosis not present

## 2022-10-22 DIAGNOSIS — R1310 Dysphagia, unspecified: Secondary | ICD-10-CM | POA: Diagnosis not present

## 2022-10-22 DIAGNOSIS — G629 Polyneuropathy, unspecified: Secondary | ICD-10-CM | POA: Diagnosis not present

## 2022-10-22 DIAGNOSIS — Z9221 Personal history of antineoplastic chemotherapy: Secondary | ICD-10-CM | POA: Diagnosis not present

## 2022-10-22 DIAGNOSIS — Z801 Family history of malignant neoplasm of trachea, bronchus and lung: Secondary | ICD-10-CM | POA: Diagnosis not present

## 2022-10-22 DIAGNOSIS — Z79899 Other long term (current) drug therapy: Secondary | ICD-10-CM | POA: Diagnosis not present

## 2022-10-22 DIAGNOSIS — R7989 Other specified abnormal findings of blood chemistry: Secondary | ICD-10-CM | POA: Diagnosis not present

## 2022-10-22 DIAGNOSIS — Z87891 Personal history of nicotine dependence: Secondary | ICD-10-CM | POA: Diagnosis not present

## 2022-10-22 DIAGNOSIS — Z85818 Personal history of malignant neoplasm of other sites of lip, oral cavity, and pharynx: Secondary | ICD-10-CM | POA: Diagnosis present

## 2022-10-22 NOTE — Patient Instructions (Signed)
[facility] at [facility] Discharge Instructions   You were seen and examined today by Dr. Ellin Saba.  He reviewed the results of your CT scan which is normal.   He reviewed the results of your lab work which was normal/stable.   We will see you back in 6 months. We will repeat lab work and a scan prior to your next visit.    Thank you for choosing [facility] at [facility] to provide your oncology and hematology care.  To afford each patient quality time with our provider, please arrive at least 15 minutes before your scheduled appointment time.   If you have a lab appointment with the Cancer Center please come in thru the Main Entrance and check in at the main information desk.  You need to re-schedule your appointment should you arrive 10 or more minutes late.  We strive to give you quality time with our providers, and arriving late affects you and other patients whose appointments are after yours.  Also, if you no show three or more times for appointments you may be dismissed from the clinic at the providers discretion.     Again, thank you for choosing [facility].  Our hope is that these requests will decrease the amount of time that you wait before being seen by our physicians.       _____________________________________________________________  Should you have questions after your visit to [facility], please contact our office at [phone] and follow the prompts.  Our office hours are 8:00 a.m. and 4:30 p.m. Monday - Friday.  Please note that voicemails left after 4:00 p.m. may not be returned until the following business day.  We are closed weekends and major holidays.  You do have access to a nurse 24-7, just call the main number to the clinic [phone] and do not press any options, hold on the line and a nurse will answer the phone.    For prescription refill requests, have your pharmacy contact our  office and allow 72 hours.    Due to Covid, you will need to wear a mask upon entering the hospital. If you do not have a mask, a mask will be given to you at the Main Entrance upon arrival. For doctor visits, patients may have 1 support person age 19 or older with them. For treatment visits, patients can not have anyone with them due to social distancing guidelines and our immunocompromised population.

## 2022-11-05 ENCOUNTER — Ambulatory Visit: Payer: Medicare HMO | Admitting: General Surgery

## 2022-11-05 ENCOUNTER — Other Ambulatory Visit: Payer: Self-pay

## 2022-11-05 ENCOUNTER — Encounter: Payer: Self-pay | Admitting: General Surgery

## 2022-11-05 VITALS — BP 168/71 | HR 52 | Temp 97.7°F | Resp 18 | Ht 67.0 in | Wt 145.0 lb

## 2022-11-05 DIAGNOSIS — C099 Malignant neoplasm of tonsil, unspecified: Secondary | ICD-10-CM

## 2022-11-05 NOTE — Progress Notes (Signed)
Douglas [name]; [id number]; 05/02/1946   HPI Patient is a 77-year-old white male who was referred to my care by Dr. Katragadda of oncology for Port-A-Cath removal.  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  He no longer needs his Port-A-Cath. Past Medical History:  Diagnosis Date   Arthritis    Cancer (HCC)    Skin, and Kidney   Hypertension    Pre-diabetes    Skin cancer     Past Surgical History:  Procedure Laterality Date   COLONOSCOPY     HERNIA REPAIR     IR GASTROSTOMY TUBE MOD SED  12/11/2020   IR GASTROSTOMY TUBE REMOVAL  04/25/2021   IR IMAGING GUIDED PORT INSERTION  12/11/2020   LAPAROSCOPIC PARTIAL NEPHRECTOMY Left    PARTIAL NEPHRECTOMY Left 2009   REVERSE SHOULDER ARTHROPLASTY Right 09/10/2019   Procedure: REVERSE SHOULDER ARTHROPLASTY;  Surgeon: Norris, Steve, MD;  Location: [facility];  Service: Orthopedics;  Laterality: Right;  interscalene block   SKIN SURGERY      Family History  Problem Relation Age of Onset   Diabetes Mother    Pulmonary embolism Father    Lung cancer Sister    Lung cancer Sister    Diabetes Brother    Lung cancer Brother     Current Outpatient Medications on File Prior to Visit  Medication Sig Dispense Refill   atenolol (TENORMIN) 25 MG tablet Take 25 mg by mouth daily.     cholecalciferol (VITAMIN D3) 25 MCG (1000 UNIT) tablet Take 1,000 Units by mouth daily.     fluticasone (FLONASE) 50 MCG/ACT nasal spray Place into both nostrils.     gabapentin (NEURONTIN) 300 MG capsule TAKE ONE CAPSULE BY MOUTH AT BEDTIME 30 capsule 6   lidocaine-prilocaine (EMLA) cream Apply to affected area once 30 g 3   lisinopril (ZESTRIL) 40 MG tablet Take 40 mg by mouth daily.     meclizine (ANTIVERT) 25 MG tablet      Menaquinone-7 (VITAMIN K2 PO) Take 1 tablet by mouth daily.     moxifloxacin (VIGAMOX) 0.5 % ophthalmic solution USE 1 DROP into THE affected eye(s) THREE TIMES DAILY     Multiple Vitamins-Minerals (MULTIVITAMIN  WITH MINERALS) tablet Take 1 tablet by mouth daily.     ondansetron (ZOFRAN) 8 MG tablet Take 1 tablet (8 mg total) by mouth 2 (two) times daily as needed. Start on the third day after cisplatin chemotherapy. 30 tablet 1   prednisoLONE acetate (PRED FORTE) 1 % ophthalmic suspension Place 1 drop into the left eye 3 (three) times daily.     simvastatin (ZOCOR) 20 MG tablet Take 20 mg by mouth daily.     tamsulosin (FLOMAX) 0.4 MG CAPS capsule Take 0.8 mg by mouth daily.     zinc gluconate 50 MG tablet Take 50 mg by mouth daily.     No current facility-administered medications on file prior to visit.    No Known Allergies  Social History   Substance and Sexual Activity  Alcohol Use Not Currently   Alcohol/week: 1.0 standard drink of alcohol   Types: 1 Cans of beer per week   Comment: occ    Social History   Tobacco Use  Smoking Status Former   Packs/day: 1.00   Years: 20.00   Additional pack years: 0.00   Total pack years: 20.00   Types: Cigarettes   Quit date: 2010   Years since quitting: 14.5  Smokeless Tobacco Never      Review of Systems  Constitutional: Negative.   HENT: Negative.    Eyes:  Positive for blurred vision.  Respiratory: Negative.    Cardiovascular: Negative.   Gastrointestinal: Negative.   Genitourinary: Negative.   Musculoskeletal: Negative.   Skin: Negative.   Neurological: Negative.   Endo/Heme/Allergies: Negative.   Psychiatric/Behavioral: Negative.      Objective   Vitals:   11/05/22 1144  BP: (!) 168/71  Pulse: (!) 52  Resp: 18  Temp: 97.7 F (36.5 C)  SpO2: 95%    Physical Exam Vitals reviewed.  Constitutional:      Appearance: Normal appearance. He is normal weight. He is not ill-appearing.  HENT:     Head: Normocephalic and atraumatic.  Cardiovascular:     Rate and Rhythm: Normal rate and regular rhythm.     Heart sounds: Normal heart sounds. No murmur heard.    No friction rub. No gallop.  Pulmonary:     Effort: Pulmonary  effort is normal. No respiratory distress.     Breath sounds: Normal breath sounds. No stridor. No wheezing, rhonchi or rales.     Comments: Port-A-Cath in place with right upper chest. Skin:    General: Skin is warm and dry.  Neurological:     Mental Status: He is alert and oriented to person, place, and time.   Oncology notes reviewed  Assessment  Tonsillar carcinoma, finished with chemotherapy Plan  Patient is scheduled for Port-A-Cath removal in the minor procedure room on 11/06/2022.  The risks and benefits of the procedure were fully explained to the patient, who gave informed consent. 

## 2022-11-05 NOTE — H&P (Signed)
Douglas [name]; [id number]; November 19, 1945   HPI Patient is a 77 year old white male who was referred to my care by Dr. Ellin Saba of oncology for Port-A-Cath removal.  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  He no longer needs his Port-A-Cath. Past Medical History:  Diagnosis Date   Arthritis    Cancer (HCC)    Skin, and Kidney   Hypertension    Pre-diabetes    Skin cancer     Past Surgical History:  Procedure Laterality Date   COLONOSCOPY     HERNIA REPAIR     IR GASTROSTOMY TUBE MOD SED  12/11/2020   IR GASTROSTOMY TUBE REMOVAL  04/25/2021   IR IMAGING GUIDED PORT INSERTION  12/11/2020   LAPAROSCOPIC PARTIAL NEPHRECTOMY Left    PARTIAL NEPHRECTOMY Left 2009   REVERSE SHOULDER ARTHROPLASTY Right 09/10/2019   Procedure: REVERSE SHOULDER ARTHROPLASTY;  Surgeon: Beverely Low, MD;  Location: [facility];  Service: Orthopedics;  Laterality: Right;  interscalene block   SKIN SURGERY      Family History  Problem Relation Age of Onset   Diabetes Mother    Pulmonary embolism Father    Lung cancer Sister    Lung cancer Sister    Diabetes Brother    Lung cancer Brother     Current Outpatient Medications on File Prior to Visit  Medication Sig Dispense Refill   atenolol (TENORMIN) 25 MG tablet Take 25 mg by mouth daily.     cholecalciferol (VITAMIN D3) 25 MCG (1000 UNIT) tablet Take 1,000 Units by mouth daily.     fluticasone (FLONASE) 50 MCG/ACT nasal spray Place into both nostrils.     gabapentin (NEURONTIN) 300 MG capsule TAKE ONE CAPSULE BY MOUTH AT BEDTIME 30 capsule 6   lidocaine-prilocaine (EMLA) cream Apply to affected area once 30 g 3   lisinopril (ZESTRIL) 40 MG tablet Take 40 mg by mouth daily.     meclizine (ANTIVERT) 25 MG tablet      Menaquinone-7 (VITAMIN K2 PO) Take 1 tablet by mouth daily.     moxifloxacin (VIGAMOX) 0.5 % ophthalmic solution USE 1 DROP into THE affected eye(s) THREE TIMES DAILY     Multiple Vitamins-Minerals (MULTIVITAMIN  WITH MINERALS) tablet Take 1 tablet by mouth daily.     ondansetron (ZOFRAN) 8 MG tablet Take 1 tablet (8 mg total) by mouth 2 (two) times daily as needed. Start on the third day after cisplatin chemotherapy. 30 tablet 1   prednisoLONE acetate (PRED FORTE) 1 % ophthalmic suspension Place 1 drop into the left eye 3 (three) times daily.     simvastatin (ZOCOR) 20 MG tablet Take 20 mg by mouth daily.     tamsulosin (FLOMAX) 0.4 MG CAPS capsule Take 0.8 mg by mouth daily.     zinc gluconate 50 MG tablet Take 50 mg by mouth daily.     No current facility-administered medications on file prior to visit.    No Known Allergies  Social History   Substance and Sexual Activity  Alcohol Use Not Currently   Alcohol/week: 1.0 standard drink of alcohol   Types: 1 Cans of beer per week   Comment: occ    Social History   Tobacco Use  Smoking Status Former   Packs/day: 1.00   Years: 20.00   Additional pack years: 0.00   Total pack years: 20.00   Types: Cigarettes   Quit date: 2010   Years since quitting: 14.5  Smokeless Tobacco Never  Review of Systems  Constitutional: Negative.   HENT: Negative.    Eyes:  Positive for blurred vision.  Respiratory: Negative.    Cardiovascular: Negative.   Gastrointestinal: Negative.   Genitourinary: Negative.   Musculoskeletal: Negative.   Skin: Negative.   Neurological: Negative.   Endo/Heme/Allergies: Negative.   Psychiatric/Behavioral: Negative.      Objective   Vitals:   11/05/22 1144  BP: (!) 168/71  Pulse: (!) 52  Resp: 18  Temp: 97.7 F (36.5 C)  SpO2: 95%    Physical Exam Vitals reviewed.  Constitutional:      Appearance: Normal appearance. He is normal weight. He is not ill-appearing.  HENT:     Head: Normocephalic and atraumatic.  Cardiovascular:     Rate and Rhythm: Normal rate and regular rhythm.     Heart sounds: Normal heart sounds. No murmur heard.    No friction rub. No gallop.  Pulmonary:     Effort: Pulmonary  effort is normal. No respiratory distress.     Breath sounds: Normal breath sounds. No stridor. No wheezing, rhonchi or rales.     Comments: Port-A-Cath in place with right upper chest. Skin:    General: Skin is warm and dry.  Neurological:     Mental Status: He is alert and oriented to person, place, and time.   Oncology notes reviewed  Assessment  Tonsillar carcinoma, finished with chemotherapy Plan  Patient is scheduled for Port-A-Cath removal in the minor procedure room on 11/06/2022.  The risks and benefits of the procedure were fully explained to the patient, who gave informed consent.

## 2022-11-06 ENCOUNTER — Encounter (HOSPITAL_COMMUNITY)
Admission: RE | Disposition: A | Discharge status: Home / Self Care | Payer: Self-pay | Source: Home / Self Care | Attending: General Surgery

## 2022-11-06 ENCOUNTER — Ambulatory Visit (HOSPITAL_COMMUNITY)
Admission: RE | Admit: 2022-11-06 | Discharge: 2022-11-06 | Disposition: A | Discharge status: Home / Self Care | Payer: Medicare HMO | Attending: General Surgery | Admitting: General Surgery

## 2022-11-06 DIAGNOSIS — Z8589 Personal history of malignant neoplasm of other organs and systems: Secondary | ICD-10-CM | POA: Insufficient documentation

## 2022-11-06 DIAGNOSIS — Z9221 Personal history of antineoplastic chemotherapy: Secondary | ICD-10-CM | POA: Diagnosis not present

## 2022-11-06 DIAGNOSIS — C099 Malignant neoplasm of tonsil, unspecified: Secondary | ICD-10-CM | POA: Diagnosis not present

## 2022-11-06 DIAGNOSIS — Z452 Encounter for adjustment and management of vascular access device: Secondary | ICD-10-CM | POA: Insufficient documentation

## 2022-11-06 HISTORY — PX: PORT-A-CATH REMOVAL: SHX5289

## 2022-11-06 SURGERY — MINOR REMOVAL PORT-A-CATH
Anesthesia: LOCAL | Laterality: Right

## 2022-11-06 MED ORDER — LIDOCAINE HCL (PF) 1 % IJ SOLN
INTRAMUSCULAR | Status: DC | PRN
  Administered 2022-11-06: 6 mL

## 2022-11-06 MED ORDER — CHLORHEXIDINE GLUCONATE CLOTH 2 % EX PADS: 6.0000 | MEDICATED_PAD | Freq: Once | CUTANEOUS | Status: DC

## 2022-11-06 MED ORDER — LIDOCAINE HCL (PF) 1 % IJ SOLN
INTRAMUSCULAR | Status: AC
  Filled 2022-11-06: qty 30

## 2022-11-06 SURGICAL SUPPLY — 22 items
ADH SKN CLS APL DERMABOND .7 (GAUZE/BANDAGES/DRESSINGS) ×1
APL PRP STRL LF ISPRP CHG 10.5 (MISCELLANEOUS) ×1
APPLICATOR CHLORAPREP 10.5 ORG (MISCELLANEOUS) ×1 IMPLANT
CLOTH BEACON ORANGE TIMEOUT ST (SAFETY) ×1 IMPLANT
DECANTER SPIKE VIAL GLASS SM (MISCELLANEOUS) ×1 IMPLANT
DERMABOND ADVANCED .7 DNX12 (GAUZE/BANDAGES/DRESSINGS) ×1 IMPLANT
DRAPE HALF SHEET 40X57 (DRAPES) IMPLANT
ELECT REM PT RETURN 9FT ADLT (ELECTROSURGICAL) ×1
ELECTRODE REM PT RTRN 9FT ADLT (ELECTROSURGICAL) ×1 IMPLANT
GLOVE BIOGEL PI IND STRL 7.0 (GLOVE) ×2 IMPLANT
GLOVE SURG SS PI 7.5 STRL IVOR (GLOVE) ×2 IMPLANT
GOWN STRL REUS W/TWL LRG LVL3 (GOWN DISPOSABLE) IMPLANT
NDL HYPO 25X1 1.5 SAFETY (NEEDLE) ×1 IMPLANT
NEEDLE HYPO 25X1 1.5 SAFETY (NEEDLE) ×1 IMPLANT
PENCIL SMOKE EVACUATOR COATED (MISCELLANEOUS) IMPLANT
POSITIONER HEAD 8X9X4 ADT (SOFTGOODS) ×1 IMPLANT
SPONGE GAUZE 2X2 8PLY STRL LF (GAUZE/BANDAGES/DRESSINGS) ×1 IMPLANT
SUT MNCRL AB 4-0 PS2 18 (SUTURE) ×1 IMPLANT
SUT VIC AB 3-0 SH 27 (SUTURE) ×1
SUT VIC AB 3-0 SH 27X BRD (SUTURE) ×1 IMPLANT
SYR CONTROL 10ML LL (SYRINGE) ×1 IMPLANT
TOWEL OR 17X26 4PK STRL BLUE (TOWEL DISPOSABLE) ×1 IMPLANT

## 2022-11-06 NOTE — Op Note (Signed)
Patient:  Douglas [name]  DOB:  [birth year]  MRN:  [id number]   Preop Diagnosis: Tonsillar carcinoma, finished with chemotherapy  Postop Diagnosis: Same  Procedure: Port-A-Cath removal  Surgeon: Franky Macho, MD  Anes: Local  Indications: Patient is a 77 year old white male who underwent chemotherapy for tonsillar carcinoma.  He is finished with chemotherapy and presents for Port-A-Cath removal.  The risks and benefits of the procedure including bleeding and infection were fully explained to the patient, who gave informed consent.  Procedure note: The patient was placed in the supine position in the minor procedure room.  The right upper chest was prepped and draped using usual sterile technique with ChloraPrep.  Surgical site confirmation was performed.  1% Xylocaine was used for local anesthesia.  An incision was made through the previous surgical incision site.  The dissection was taken down to the Port-A-Cath.  The Port-A-Cath was removed in total without difficulty.  Pressure was held at the point of insertion in the right internal jugular.  The subcutaneous layer was reapproximated using a 3-0 Vicryl interrupted suture.  The skin was closed using a 4-0 Monocryl subcuticular suture.  Dermabond was applied.  All tape and needle counts were correct at the end of the procedure.  The patient was discharged from the minor procedure room in good and stable condition.  Complications: None  EBL: Minimal  Specimen: None

## 2022-11-06 NOTE — Interval H&P Note (Signed)
History and Physical Interval Note:  11/06/2022 8:58 AM  Douglas [name]  has presented today for surgery, with the diagnosis of TONSIL CANCER.  The various methods of treatment have been discussed with the patient and family. After consideration of risks, benefits and other options for treatment, the patient has consented to  Procedure(s): MINOR REMOVAL PORT-A-CATH (Right) as a surgical intervention.  The patient's history has been reviewed, patient examined, no change in status, stable for surgery.  I have reviewed the patient's chart and labs.  Questions were answered to the patient's satisfaction.     Franky Macho

## 2022-11-08 ENCOUNTER — Encounter (HOSPITAL_COMMUNITY): Payer: Self-pay | Admitting: General Surgery

## 2022-12-09 ENCOUNTER — Other Ambulatory Visit: Payer: Self-pay | Admitting: *Deleted

## 2022-12-09 ENCOUNTER — Telehealth: Payer: Self-pay | Admitting: *Deleted

## 2022-12-09 DIAGNOSIS — C099 Malignant neoplasm of tonsil, unspecified: Secondary | ICD-10-CM

## 2022-12-09 NOTE — Telephone Encounter (Signed)
Patient called to advise that he is having pain in throat and left side of neck, associated with difficulty swallowing.  Appointment made for lab and to see Dr. Ellin Saba Wednesday Aug 7 th.

## 2022-12-10 NOTE — Progress Notes (Signed)
[facility] [address], [place] [address]    Clinic Day:  12/10/2022  Referring physician: Shelby Dubin, FNP  Patient Care Team: Shelby Dubin, FNP as PCP - General (Family Medicine) Therese Sarah, RN as Oncology Nurse Navigator (Oncology)   ASSESSMENT & PLAN:   Assessment: 1.  Left tonsillar squamous cell cancer: - Left tonsillar biopsy by Dr. Suszanne Conners on 11/15/2020 consistent with squamous cell carcinoma.  P16 is strongly positive. - PET scan on 12/07/2020-shows 4.1 cm left palatine tonsil mass with SUV 16.  0.8 cm left level 3 lymph node SUV 4.4.  Elongated 0.9 x 1 cm right lower lobe nodule along the azygoesophageal recess has accentuated metabolic activity with SUV 5.8.  This has some solid component and was not visible on chest CT of 04/24/2020.  Differential includes unusual morphology of metastatic disease versus inflammatory process.  Groundglass opacity laterally in the right upper lobe likely inflammatory with SUV 1.4.  0.6 cm right middle lobe nodule not hypermetabolic. - Concurrent chemoradiation therapy from 01/09/2021 through 02/26/2021 - Pretreatment audiogram from Dr. Avel Sensor office showed bilateral sensorineural hearing loss.  Hearing on the left side is slightly worse due to tumor.  2.  Social/family history: - He has a retired after working for [employer].  He quit smoking 12 years ago.  He smoked 2 packs/day for 20 years. - Sister had lung cancer.  2 sisters died of lung cancer.  Brother had lung cancer.    Plan: 1.  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 - Continues to have some dysphagia to certain foods like breads. - Oropharynx exam: No masses seen in general and left side of the tongue in particular.  No palpable adenopathy. - CT soft tissue neck on 10/14/2022: Normal masslike ill-defined hyperenhancement in the left oral tongue/glossotonsillar sulcus is similar to the most recent study.  No  lymphadenopathy. - Labs from 10/14/2022: Normal LFTs.  CBC grossly normal. - RTC 6 months for follow-up with repeat scan and labs. - His port does not give blood return.  He wants to discontinue it.  Will make referral.  2.  Elevated creatinine: - Creatinine is down to his baseline of 1.34.  3.  Peripheral neuropathy: - Continue gabapentin at bedtime.    No orders of the defined types were placed in this encounter.     Alben Deeds Teague,acting as a Neurosurgeon for Doreatha Massed, MD.,have documented all relevant documentation on the behalf of Doreatha Massed, MD,as directed by  Doreatha Massed, MD while in the presence of Doreatha Massed, MD.  ***   [place] R Teague   8/6/202410:00 PM  CHIEF COMPLAINT:   Diagnosis: left tonsil cancer    Cancer Staging  Primary tonsillar squamous cell carcinoma (HCC) Staging form: Pharynx - HPV-Mediated Oropharynx, AJCC 8th Edition - Clinical stage from 11/29/2020: cT4, p16+ - Signed by Artis Delay, MD on 11/29/2020    Prior Therapy: 1. Partial nephrectomy 12 years ago 2. Cisplatin weekly  Current Therapy:  surveillance    HISTORY OF PRESENT ILLNESS:   Oncology History  Primary tonsillar squamous cell carcinoma (HCC)  09/04/2020 Initial Diagnosis   He has been complaining of left sided ear pain, odynphagia and dysphagia   11/15/2020 Pathology Results   [id number] Tonsil biopsy: squamous cell carcinoma P16 strongly positive   11/15/2020 Procedure   He underwent tonsil biopsy   11/22/2020 Imaging   CT neck 1. 4.1 cm left  tonsil mass most consistent with squamous cell carcinoma. There is preferential submucosal growth and the mass is indistinguishable from the left prevertebral space and lower left stylopharyngeaus. No convincing adenopathy. 2. Mixed density right upper lobe nodule, attention on anticipated PET.   11/29/2020 Initial Diagnosis   Tonsil cancer (HCC)   11/29/2020 Cancer Staging   Staging form: Pharynx -  HPV-Mediated Oropharynx, AJCC 8th Edition - Clinical stage from 11/29/2020: cT4, p16+ - Signed by Artis Delay, MD on 11/29/2020 Stage prefix: Initial diagnosis   01/09/2021 - 02/20/2021 Chemotherapy   Patient is on Treatment Plan : HEAD/NECK Cisplatin q7d        INTERVAL HISTORY:   Ysrael is a 77 y.o. male presenting to clinic today for follow up of left tonsil cancer. He was last seen by me on 10/22/22.  Since his last visit, he had his port removed on 11/06/22 with Dr. Lovell Sheehan.   Today, he states that he is doing well overall. His appetite level is at ***%. His energy level is at ***%.  PAST MEDICAL HISTORY:   Past Medical History: Past Medical History:  Diagnosis Date   Arthritis    Cancer (HCC)    Skin, and Kidney   Hypertension    Pre-diabetes    Skin cancer     Surgical History: Past Surgical History:  Procedure Laterality Date   COLONOSCOPY     HERNIA REPAIR     IR GASTROSTOMY TUBE MOD SED  12/11/2020   IR GASTROSTOMY TUBE REMOVAL  04/25/2021   IR IMAGING GUIDED PORT INSERTION  12/11/2020   LAPAROSCOPIC PARTIAL NEPHRECTOMY Left    PARTIAL NEPHRECTOMY Left 2009   PORT-A-CATH REMOVAL Right 11/06/2022   Procedure: MINOR REMOVAL PORT-A-CATH;  Surgeon: Franky Macho, MD;  Location: [facility];  Service: General;  Laterality: Right;   REVERSE SHOULDER ARTHROPLASTY Right 09/10/2019   Procedure: REVERSE SHOULDER ARTHROPLASTY;  Surgeon: Beverely Low, MD;  Location: [facility];  Service: Orthopedics;  Laterality: Right;  interscalene block   SKIN SURGERY      Social History: Social History   Socioeconomic History   Marital status: Widowed    Spouse name: Not on file   Number of children: Not on file   Years of education: Not on file   Highest education level: Not on file  Occupational History    Employer: [employer]  Tobacco Use   Smoking status: Former    Current packs/day: 0.00    Average packs/day: 1 pack/day for 20.0 years (20.0 ttl pk-yrs)    Types: Cigarettes    Start date:  27    Quit date: 2010    Years since quitting: 14.6   Smokeless tobacco: Never  Vaping Use   Vaping status: Never Used  Substance and Sexual Activity   Alcohol use: Not Currently    Alcohol/week: 1.0 standard drink of alcohol    Types: 1 Cans of beer per week    Comment: occ   Drug use: Never   Sexual activity: Not Currently  Other Topics Concern   Not on file  Social History Narrative   Right and Left Handed.      Lives in a one story home    Drinks Caffeine    Social Determinants of Health   Financial Resource Strain: Low Risk  (11/29/2020)   Overall Financial Resource Strain (CARDIA)    Difficulty of Paying Living Expenses: Not hard at all  Food Insecurity: No Food Insecurity (11/29/2020)   Hunger Vital Sign  Worried About Programme researcher, broadcasting/film/video in the Last Year: Never true    Ran Out of Food in the Last Year: Never true  Transportation Needs: No Transportation Needs (11/29/2020)   PRAPARE - Administrator, Civil Service (Medical): No    Lack of Transportation (Non-Medical): No  Physical Activity: Sufficiently Active (11/29/2020)   Exercise Vital Sign    Days of Exercise per Week: 7 days    Minutes of Exercise per Session: 30 min  Stress: No Stress Concern Present (11/29/2020)   [employer] of Occupational Health - Occupational Stress Questionnaire    Feeling of Stress : Not at all  Social Connections: Socially Isolated (11/29/2020)   Social Connection and Isolation Panel [NHANES]    Frequency of Communication with Friends and Family: More than three times a week    Frequency of Social Gatherings with Friends and Family: More than three times a week    Attends Religious Services: Never    Database administrator or Organizations: No    Attends Banker Meetings: Never    Marital Status: Widowed  Intimate Partner Violence: Not At Risk (11/29/2020)   Humiliation, Afraid, Rape, and Kick questionnaire    Fear of Current or Ex-Partner: No     Emotionally Abused: No    Physically Abused: No    Sexually Abused: No    Family History: Family History  Problem Relation Age of Onset   Diabetes Mother    Pulmonary embolism Father    Lung cancer Sister    Lung cancer Sister    Diabetes Brother    Lung cancer Brother     Current Medications:  Current Outpatient Medications:    atenolol (TENORMIN) 25 MG tablet, Take 25 mg by mouth every evening., Disp: , Rfl:    cholecalciferol (VITAMIN D3) 25 MCG (1000 UNIT) tablet, Take 1,000 Units by mouth in the morning., Disp: , Rfl:    diclofenac (VOLTAREN) 75 MG EC tablet, Take 75 mg by mouth 2 (two) times daily as needed (pain.)., Disp: , Rfl:    fluticasone (FLONASE) 50 MCG/ACT nasal spray, Place 1 spray into both nostrils daily as needed (allergies.)., Disp: , Rfl:    gabapentin (NEURONTIN) 300 MG capsule, TAKE ONE CAPSULE BY MOUTH AT BEDTIME, Disp: 30 capsule, Rfl: 6   lisinopril (ZESTRIL) 40 MG tablet, Take 40 mg by mouth every evening., Disp: , Rfl:    meclizine (ANTIVERT) 25 MG tablet, Take 25 mg by mouth 2 (two) times daily as needed (dizziness/vertigo)., Disp: , Rfl:    Menaquinone-7 (VITAMIN K2 PO), Take 1 tablet by mouth in the morning., Disp: , Rfl:    moxifloxacin (VIGAMOX) 0.5 % ophthalmic solution, Place 1 drop into the left eye in the morning., Disp: , Rfl:    Multiple Vitamins-Minerals (MULTIVITAMIN WITH MINERALS) tablet, Take 1 tablet by mouth daily., Disp: , Rfl:    prednisoLONE acetate (PRED FORTE) 1 % ophthalmic suspension, Place 1 drop into the left eye in the morning., Disp: , Rfl:    simvastatin (ZOCOR) 40 MG tablet, Take 40 mg by mouth every evening., Disp: , Rfl:    tamsulosin (FLOMAX) 0.4 MG CAPS capsule, Take 0.8 mg by mouth every evening., Disp: , Rfl:    zinc gluconate 50 MG tablet, Take 50 mg by mouth in the morning., Disp: , Rfl:    Allergies: No Known Allergies  REVIEW OF SYSTEMS:   Review of Systems  Constitutional:  Negative for chills, fatigue and  fever.  HENT:   Negative for lump/mass, mouth sores, nosebleeds, sore throat and trouble swallowing.   Eyes:  Negative for eye problems.  Respiratory:  Negative for cough and shortness of breath.   Cardiovascular:  Negative for chest pain, leg swelling and palpitations.  Gastrointestinal:  Negative for abdominal pain, constipation, diarrhea, nausea and vomiting.  Genitourinary:  Negative for bladder incontinence, difficulty urinating, dysuria, frequency, hematuria and nocturia.   Musculoskeletal:  Negative for arthralgias, back pain, flank pain, myalgias and neck pain.  Skin:  Negative for itching and rash.  Neurological:  Negative for dizziness, headaches and numbness.  Hematological:  Does not bruise/bleed easily.  Psychiatric/Behavioral:  Negative for depression, sleep disturbance and suicidal ideas. The patient is not nervous/anxious.   All other systems reviewed and are negative.    VITALS:   There were no vitals taken for this visit.  Wt Readings from Last 3 Encounters:  11/05/22 145 lb (65.8 kg)  10/22/22 145 lb 6.4 oz (66 kg)  04/18/22 152 lb 14.4 oz (69.4 kg)    There is no height or weight on file to calculate BMI.  Performance status (ECOG): 1 - Symptomatic but completely ambulatory  PHYSICAL EXAM:   Physical Exam Vitals and nursing note reviewed. Exam conducted with a chaperone present.  Constitutional:      Appearance: Normal appearance.  Cardiovascular:     Rate and Rhythm: Normal rate and regular rhythm.     Pulses: Normal pulses.     Heart sounds: Normal heart sounds.  Pulmonary:     Effort: Pulmonary effort is normal.     Breath sounds: Normal breath sounds.  Abdominal:     Palpations: Abdomen is soft. There is no hepatomegaly, splenomegaly or mass.     Tenderness: There is no abdominal tenderness.  Musculoskeletal:     Right lower leg: No edema.     Left lower leg: No edema.  Lymphadenopathy:     Cervical: No cervical adenopathy.     Right cervical:  No superficial, deep or posterior cervical adenopathy.    Left cervical: No superficial, deep or posterior cervical adenopathy.     Upper Body:     Right upper body: No supraclavicular or axillary adenopathy.     Left upper body: No supraclavicular or axillary adenopathy.  Neurological:     General: No focal deficit present.     Mental Status: He is alert and oriented to person, place, and time.  Psychiatric:        Mood and Affect: Mood normal.        Behavior: Behavior normal.     LABS:      Latest Ref Rng & Units 10/14/2022    1:01 PM 04/18/2022    9:11 AM 04/11/2022    9:19 AM  CBC  WBC 4.0 - 10.5 K/uL 10.2  7.7  6.3   Hemoglobin 13.0 - 17.0 g/dL 57.8  46.9  62.9   Hematocrit 39.0 - 52.0 % 40.9  40.7  40.3   Platelets 150 - 400 K/uL 193  205  205       Latest Ref Rng & Units 10/14/2022    2:02 PM 10/14/2022    1:01 PM 04/18/2022    9:11 AM  CMP  Glucose 70 - 99 mg/dL  528  97   BUN 8 - 23 mg/dL  31  25   Creatinine 4.13 - 1.24 mg/dL 2.44  0.10  2.72   Sodium 135 - 145 mmol/L  136  137   Potassium 3.5 - 5.1 mmol/L  4.3  4.6   Chloride 98 - 111 mmol/L  103  103   CO2 22 - 32 mmol/L  26  29   Calcium 8.9 - 10.3 mg/dL  8.8  9.3   Total Protein 6.5 - 8.1 g/dL  6.2  6.9   Total Bilirubin 0.3 - 1.2 mg/dL  1.1  0.9   Alkaline Phos 38 - 126 U/L  55  65   AST 15 - 41 U/L  30  31   ALT 0 - 44 U/L  29  29      No results found for: "CEA1", "CEA" / No results found for: "CEA1", "CEA" No results found for: "PSA1" No results found for: "HQI696" No results found for: "CAN125"  No results found for: "TOTALPROTELP", "ALBUMINELP", "A1GS", "A2GS", "BETS", "BETA2SER", "GAMS", "MSPIKE", "SPEI" Lab Results  Component Value Date   TIBC 364 04/11/2022   FERRITIN 62 04/11/2022   IRONPCTSAT 30 04/11/2022   No results found for: "LDH"   STUDIES:   No results found.

## 2022-12-11 ENCOUNTER — Inpatient Hospital Stay: Payer: Medicare HMO

## 2022-12-11 ENCOUNTER — Inpatient Hospital Stay: Payer: Medicare HMO | Attending: Hematology | Admitting: Hematology

## 2022-12-11 DIAGNOSIS — G629 Polyneuropathy, unspecified: Secondary | ICD-10-CM | POA: Insufficient documentation

## 2022-12-11 DIAGNOSIS — R7989 Other specified abnormal findings of blood chemistry: Secondary | ICD-10-CM | POA: Diagnosis not present

## 2022-12-11 DIAGNOSIS — Z85818 Personal history of malignant neoplasm of other sites of lip, oral cavity, and pharynx: Secondary | ICD-10-CM | POA: Insufficient documentation

## 2022-12-11 DIAGNOSIS — Z79899 Other long term (current) drug therapy: Secondary | ICD-10-CM | POA: Insufficient documentation

## 2022-12-11 DIAGNOSIS — Z87891 Personal history of nicotine dependence: Secondary | ICD-10-CM | POA: Insufficient documentation

## 2022-12-11 DIAGNOSIS — C099 Malignant neoplasm of tonsil, unspecified: Secondary | ICD-10-CM

## 2022-12-11 LAB — COMPREHENSIVE METABOLIC PANEL
ALT: 40 U/L (ref 0–44)
AST: 42 U/L — ABNORMAL HIGH (ref 15–41)
Albumin: 4.1 g/dL (ref 3.5–5.0)
Alkaline Phosphatase: 59 U/L (ref 38–126)
Anion gap: 10 (ref 5–15)
BUN: 35 mg/dL — ABNORMAL HIGH (ref 8–23)
CO2: 25 mmol/L (ref 22–32)
Calcium: 8.8 mg/dL — ABNORMAL LOW (ref 8.9–10.3)
Chloride: 102 mmol/L (ref 98–111)
Creatinine, Ser: 1.37 mg/dL — ABNORMAL HIGH (ref 0.61–1.24)
GFR, Estimated: 54 mL/min — ABNORMAL LOW (ref >60)
Glucose, Bld: 97 mg/dL (ref 70–99)
Potassium: 4.4 mmol/L (ref 3.5–5.1)
Sodium: 137 mmol/L (ref 135–145)
Total Bilirubin: 1.2 mg/dL (ref 0.3–1.2)
Total Protein: 6.6 g/dL (ref 6.5–8.1)

## 2022-12-11 LAB — CBC WITH DIFFERENTIAL/PLATELET
Abs Immature Granulocytes: 0.03 K/uL (ref 0.00–0.07)
Basophils Absolute: 0.1 K/uL (ref 0.0–0.1)
Basophils Relative: 1 %
Eosinophils Absolute: 0.4 K/uL (ref 0.0–0.5)
Eosinophils Relative: 5 %
HCT: 41.2 % (ref 39.0–52.0)
Hemoglobin: 13.8 g/dL (ref 13.0–17.0)
Immature Granulocytes: 0 %
Lymphocytes Relative: 22 %
Lymphs Abs: 1.6 K/uL (ref 0.7–4.0)
MCH: 32.2 pg (ref 26.0–34.0)
MCHC: 33.5 g/dL (ref 30.0–36.0)
MCV: 96.3 fL (ref 80.0–100.0)
Monocytes Absolute: 0.8 K/uL (ref 0.1–1.0)
Monocytes Relative: 11 %
Neutro Abs: 4.4 K/uL (ref 1.7–7.7)
Neutrophils Relative %: 61 %
Platelets: 208 K/uL (ref 150–400)
RBC: 4.28 MIL/uL (ref 4.22–5.81)
RDW: 13.7 % (ref 11.5–15.5)
WBC: 7.2 K/uL (ref 4.0–10.5)
nRBC: 0 % (ref 0.0–0.2)

## 2022-12-11 LAB — TSH: TSH: 3.82 u[IU]/mL (ref 0.350–4.500)

## 2022-12-11 NOTE — Patient Instructions (Addendum)
[facility]  Discharge Instructions  You were seen and examined today by Dr. Ellin Saba due to your new left sided neck/throat pain with difficulty swallowing.  Dr. Ellin Saba has recommended a PET scan.  Follow-up as scheduled after the scan.  Thank you for choosing [facility] to provide your oncology and hematology care.   To afford each patient quality time with our provider, please arrive at least 15 minutes before your scheduled appointment time. You may need to reschedule your appointment if you arrive late (10 or more minutes). Arriving late affects you and other patients whose appointments are after yours.  Also, if you miss three or more appointments without notifying the office, you may be dismissed from the clinic at the provider's discretion.    Again, thank you for choosing [facility].  Our hope is that these requests will decrease the amount of time that you wait before being seen by our physicians.   If you have a lab appointment with the Cancer Center - please note that after April 8th, all labs will be drawn in the cancer center.  You do not have to check in or register with the main entrance as you have in the past but will complete your check-in at the cancer center.            _____________________________________________________________  Should you have questions after your visit to [facility], please contact our office at [phone] and follow the prompts.  Our office hours are 8:00 a.m. to 4:30 p.m. Monday - Thursday and 8:00 a.m. to 2:30 p.m. Friday.  Please note that voicemails left after 4:00 p.m. may not be returned until the following business day.  We are closed weekends and all major holidays.  You do have access to a nurse 24-7, just call the main number to the clinic [phone] and do not press any options, hold on the line and a nurse will answer the phone.    For  prescription refill requests, have your pharmacy contact our office and allow 72 hours.    Masks are no longer required in the cancer centers. If you would like for your care team to wear a mask while they are taking care of you, please let them know. You may have one support person who is at least 75 years old accompany you for your appointments.

## 2022-12-19 ENCOUNTER — Ambulatory Visit (HOSPITAL_COMMUNITY)
Admission: RE | Admit: 2022-12-19 | Discharge: 2022-12-19 | Disposition: A | Discharge status: Home / Self Care | Payer: Medicare HMO | Source: Ambulatory Visit | Attending: Hematology | Admitting: Hematology

## 2022-12-19 DIAGNOSIS — C099 Malignant neoplasm of tonsil, unspecified: Secondary | ICD-10-CM | POA: Insufficient documentation

## 2022-12-19 MED ORDER — FLUDEOXYGLUCOSE F - 18 (FDG) INJECTION
7.3400 | Freq: Once | INTRAVENOUS | Status: AC | PRN
  Administered 2022-12-19: 7.34 via INTRAVENOUS

## 2022-12-24 NOTE — Progress Notes (Signed)
[facility] [address], [place] [address]    Clinic Day:  12/25/2022  Referring physician: Shelby Dubin, FNP  Patient Care Team: Shelby Dubin, FNP as PCP - General (Family Medicine) Therese Sarah, RN as Oncology Nurse Navigator (Oncology) Doreatha Massed, MD as Medical Oncologist (Medical Oncology)   ASSESSMENT & PLAN:   Assessment: 1.  Left tonsillar squamous cell cancer: - Left tonsillar biopsy by Dr. Suszanne Conners on 11/15/2020 consistent with squamous cell carcinoma.  P16 is strongly positive. - PET scan on 12/07/2020-shows 4.1 cm left palatine tonsil mass with SUV 16.  0.8 cm left level 3 lymph node SUV 4.4.  Elongated 0.9 x 1 cm right lower lobe nodule along the azygoesophageal recess has accentuated metabolic activity with SUV 5.8.  This has some solid component and was not visible on chest CT of 04/24/2020.  Differential includes unusual morphology of metastatic disease versus inflammatory process.  Groundglass opacity laterally in the right upper lobe likely inflammatory with SUV 1.4.  0.6 cm right middle lobe nodule not hypermetabolic. - Concurrent chemoradiation therapy from 01/09/2021 through 02/26/2021 - Pretreatment audiogram from Dr. Avel Sensor office showed bilateral sensorineural hearing loss.  Hearing on the left side is slightly worse due to tumor.  2.  Social/family history: - He has a retired after working for [employer].  He quit smoking 12 years ago.  He smoked 2 packs/day for 20 years. - Sister had lung cancer.  2 sisters died of lung cancer.  Brother had lung cancer.    Plan: 1.  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  No lymphadenopathy. - He reported soreness/pain in that area when he swallows or chews food.  It started about 2 weeks ago. - Reviewed PET scan from 12/19/2022: No evidence of uptake in  the area.  No evidence of recurrence or metastatic disease. - He will follow-up with ENT in September. - RTC 6 months with repeat CT soft tissue neck and labs.  2.  Elevated creatinine: - Creatinine today is at baseline of 1.37.  3.  Peripheral neuropathy: - Continue gabapentin at bedtime.    Orders Placed This Encounter  Procedures   CT SOFT TISSUE NECK W CONTRAST    Standing Status:   Future    Standing Expiration Date:   12/25/2023    Order Specific Question:   If indicated for the ordered procedure, I authorize the administration of contrast media per Radiology protocol    Answer:   Yes    Order Specific Question:   Does the patient have a contrast media/X-ray dye allergy?    Answer:   No    Order Specific Question:   Preferred imaging location?    Answer:   [facility]   CBC with Differential    Standing Status:   Future    Standing Expiration Date:   12/25/2023   Comprehensive metabolic panel    Standing Status:   Future    Standing Expiration Date:   12/25/2023   TSH    Standing Status:   Future    Standing Expiration Date:   12/25/2023      I,Katie Daubenspeck,acting as a scribe for Doreatha Massed, MD.,have documented all relevant documentation on the behalf of Doreatha Massed, MD,as directed by  Doreatha Massed, MD while in the presence of Doreatha Massed, MD.   I, Doreatha Massed MD,  have reviewed the above documentation for accuracy and completeness, and I agree with the above.   Doreatha Massed, MD   8/21/20246:34 PM  CHIEF COMPLAINT:   Diagnosis: left tonsil cancer    Cancer Staging  Primary tonsillar squamous cell carcinoma (HCC) Staging form: Pharynx - HPV-Mediated Oropharynx, AJCC 8th Edition - Clinical stage from 11/29/2020: cT4, p16+ - Signed by Artis Delay, MD on 11/29/2020    Prior Therapy: concurrent chemoradiation with weekly cisplatin, 01/09/21 - 02/26/21  Current Therapy:  surveillance   HISTORY OF PRESENT  ILLNESS:   Oncology History  Primary tonsillar squamous cell carcinoma (HCC)  09/04/2020 Initial Diagnosis   He has been complaining of left sided ear pain, odynphagia and dysphagia   11/15/2020 Pathology Results   [id number] Tonsil biopsy: squamous cell carcinoma P16 strongly positive   11/15/2020 Procedure   He underwent tonsil biopsy   11/22/2020 Imaging   CT neck 1. 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 There is preferential submucosal growth and the mass is indistinguishable from the left prevertebral space and lower left stylopharyngeaus. No convincing adenopathy. 2. Mixed density right upper lobe nodule, attention on anticipated PET.   11/29/2020 Initial Diagnosis   Tonsil cancer (HCC)   11/29/2020 Cancer Staging   Staging form: Pharynx - HPV-Mediated Oropharynx, AJCC 8th Edition - Clinical stage from 11/29/2020: cT4, p16+ - Signed by Artis Delay, MD on 11/29/2020 Stage prefix: Initial diagnosis   01/09/2021 - 02/20/2021 Chemotherapy   Patient is on Treatment Plan : HEAD/NECK Cisplatin q7d        INTERVAL HISTORY:   Douglas [name] is a 77 y.o. male presenting to clinic today for follow up of left tonsil cancer. He was last seen by me on 12/11/22.  Since his last visit, he underwent surveillance PET scan on 12/19/22 showing no evidence of recurrent or metastatic carcinoma.  Today, he states that he is doing well overall. His appetite level is at 100%. His energy level is at 100%.  PAST MEDICAL HISTORY:   Past Medical History: Past Medical History:  Diagnosis Date   Arthritis    Cancer (HCC)    Skin, and Kidney   Hypertension    Pre-diabetes    Skin cancer     Surgical History: Past Surgical History:  Procedure Laterality Date   COLONOSCOPY     HERNIA REPAIR     IR GASTROSTOMY TUBE MOD SED  12/11/2020   IR GASTROSTOMY TUBE REMOVAL  04/25/2021   IR IMAGING GUIDED PORT INSERTION  12/11/2020   LAPAROSCOPIC PARTIAL NEPHRECTOMY Left    PARTIAL  NEPHRECTOMY Left 2009   PORT-A-CATH REMOVAL Right 11/06/2022   Procedure: MINOR REMOVAL PORT-A-CATH;  Surgeon: Franky Macho, MD;  Location: [facility];  Service: General;  Laterality: Right;   REVERSE SHOULDER ARTHROPLASTY Right 09/10/2019   Procedure: REVERSE SHOULDER ARTHROPLASTY;  Surgeon: Beverely Low, MD;  Location: [facility];  Service: Orthopedics;  Laterality: Right;  interscalene block   SKIN SURGERY      Social History: Social History   Socioeconomic History   Marital status: Widowed    Spouse name: Not on file   Number of children: Not on file   Years of education: Not on file   Highest education level: Not on file  Occupational History    Employer: [employer]  Tobacco Use   Smoking status: Former    Current packs/day: 0.00    Average packs/day: 1 pack/day for 20.0 years (20.0 ttl pk-yrs)    Types: Cigarettes  Start date: 84    Quit date: 2010    Years since quitting: 14.6   Smokeless tobacco: Never  Vaping Use   Vaping status: Never Used  Substance and Sexual Activity   Alcohol use: Not Currently    Alcohol/week: 1.0 standard drink of alcohol    Types: 1 Cans of beer per week    Comment: occ   Drug use: Never   Sexual activity: Not Currently  Other Topics Concern   Not on file  Social History Narrative   Right and Left Handed.      Lives in a one story home    Drinks Caffeine    Social Determinants of Health   Financial Resource Strain: Low Risk  (11/29/2020)   Overall Financial Resource Strain (CARDIA)    Difficulty of Paying Living Expenses: Not hard at all  Food Insecurity: No Food Insecurity (11/29/2020)   Hunger Vital Sign    Worried About Running Out of Food in the Last Year: Never true    Ran Out of Food in the Last Year: Never true  Transportation Needs: No Transportation Needs (11/29/2020)   PRAPARE - Administrator, Civil Service (Medical): No    Lack of Transportation (Non-Medical): No  Physical Activity: Sufficiently Active  (11/29/2020)   Exercise Vital Sign    Days of Exercise per Week: 7 days    Minutes of Exercise per Session: 30 min  Stress: No Stress Concern Present (11/29/2020)   [employer] of Occupational Health - Occupational Stress Questionnaire    Feeling of Stress : Not at all  Social Connections: Socially Isolated (11/29/2020)   Social Connection and Isolation Panel [NHANES]    Frequency of Communication with Friends and Family: More than three times a week    Frequency of Social Gatherings with Friends and Family: More than three times a week    Attends Religious Services: Never    Database administrator or Organizations: No    Attends Banker Meetings: Never    Marital Status: Widowed  Intimate Partner Violence: Not At Risk (11/29/2020)   Humiliation, Afraid, Rape, and Kick questionnaire    Fear of Current or Ex-Partner: No    Emotionally Abused: No    Physically Abused: No    Sexually Abused: No    Family History: Family History  Problem Relation Age of Onset   Diabetes Mother    Pulmonary embolism Father    Lung cancer Sister    Lung cancer Sister    Diabetes Brother    Lung cancer Brother     Current Medications:  Current Outpatient Medications:    atenolol (TENORMIN) 25 MG tablet, Take 25 mg by mouth every evening., Disp: , Rfl:    cholecalciferol (VITAMIN D3) 25 MCG (1000 UNIT) tablet, Take 1,000 Units by mouth in the morning., Disp: , Rfl:    diclofenac (VOLTAREN) 75 MG EC tablet, Take 75 mg by mouth 2 (two) times daily as needed (pain.)., Disp: , Rfl:    fluticasone (FLONASE) 50 MCG/ACT nasal spray, Place 1 spray into both nostrils daily as needed (allergies.)., Disp: , Rfl:    gabapentin (NEURONTIN) 300 MG capsule, TAKE ONE CAPSULE BY MOUTH AT BEDTIME, Disp: 30 capsule, Rfl: 6   lisinopril (ZESTRIL) 40 MG tablet, Take 40 mg by mouth every evening., Disp: , Rfl:    meclizine (ANTIVERT) 25 MG tablet, Take 25 mg by mouth 2 (two) times daily as needed  (dizziness/vertigo)., Disp: , Rfl:  Menaquinone-7 (VITAMIN K2 PO), Take 1 tablet by mouth in the morning., Disp: , Rfl:    moxifloxacin (VIGAMOX) 0.5 % ophthalmic solution, Place 1 drop into the left eye in the morning., Disp: , Rfl:    Multiple Vitamins-Minerals (MULTIVITAMIN WITH MINERALS) tablet, Take 1 tablet by mouth daily., Disp: , Rfl:    prednisoLONE acetate (PRED FORTE) 1 % ophthalmic suspension, Place 1 drop into the left eye in the morning., Disp: , Rfl:    simvastatin (ZOCOR) 40 MG tablet, Take 40 mg by mouth every evening., Disp: , Rfl:    tamsulosin (FLOMAX) 0.4 MG CAPS capsule, Take 0.8 mg by mouth every evening., Disp: , Rfl:    zinc gluconate 50 MG tablet, Take 50 mg by mouth in the morning., Disp: , Rfl:    Allergies: No Known Allergies  REVIEW OF SYSTEMS:   Review of Systems  Constitutional:  Negative for chills, fatigue and fever.  HENT:   Positive for trouble swallowing. Negative for lump/mass, mouth sores, nosebleeds and sore throat.   Eyes:  Negative for eye problems.  Respiratory:  Negative for cough and shortness of breath.   Cardiovascular:  Negative for chest pain, leg swelling and palpitations.  Gastrointestinal:  Negative for abdominal pain, constipation, diarrhea, nausea and vomiting.  Genitourinary:  Negative for bladder incontinence, difficulty urinating, dysuria, frequency, hematuria and nocturia.   Musculoskeletal:  Negative for arthralgias, back pain, flank pain, myalgias and neck pain.  Skin:  Negative for itching and rash.  Neurological:  Positive for dizziness. Negative for headaches and numbness.  Hematological:  Does not bruise/bleed easily.  Psychiatric/Behavioral:  Negative for depression, sleep disturbance and suicidal ideas. The patient is not nervous/anxious.   All other systems reviewed and are negative.    VITALS:   Blood pressure (!) 142/71, pulse (!) 57, temperature (!) 97.3 F (36.3 C), temperature source Oral, resp. rate 16, height  5\' 7"  (1.702 m), weight 146 lb 8 oz (66.5 kg), SpO2 97%.  Wt Readings from Last 3 Encounters:  12/25/22 146 lb 8 oz (66.5 kg)  11/05/22 145 lb (65.8 kg)  10/22/22 145 lb 6.4 oz (66 kg)    Body mass index is 22.95 kg/m.  Performance status (ECOG): 1 - Symptomatic but completely ambulatory  PHYSICAL EXAM:   Physical Exam Vitals and nursing note reviewed. Exam conducted with a chaperone present.  Constitutional:      Appearance: Normal appearance.  Cardiovascular:     Rate and Rhythm: Normal rate and regular rhythm.     Pulses: Normal pulses.     Heart sounds: Normal heart sounds.  Pulmonary:     Effort: Pulmonary effort is normal.     Breath sounds: Normal breath sounds.  Abdominal:     Palpations: Abdomen is soft. There is no hepatomegaly, splenomegaly or mass.     Tenderness: There is no abdominal tenderness.  Musculoskeletal:     Right lower leg: No edema.     Left lower leg: No edema.  Lymphadenopathy:     Cervical: No cervical adenopathy.     Right cervical: No superficial, deep or posterior cervical adenopathy.    Left cervical: No superficial, deep or posterior cervical adenopathy.     Upper Body:     Right upper body: No supraclavicular or axillary adenopathy.     Left upper body: No supraclavicular or axillary adenopathy.  Neurological:     General: No focal deficit present.     Mental Status: He is alert and oriented to  person, place, and time.  Psychiatric:        Mood and Affect: Mood normal.        Behavior: Behavior normal.     LABS:      Latest Ref Rng & Units 12/11/2022    2:41 PM 10/14/2022    1:01 PM 04/18/2022    9:11 AM  CBC  WBC 4.0 - 10.5 K/uL 7.2  10.2  7.7   Hemoglobin 13.0 - 17.0 g/dL 75.6  43.3  29.5   Hematocrit 39.0 - 52.0 % 41.2  40.9  40.7   Platelets 150 - 400 K/uL 208  193  205       Latest Ref Rng & Units 12/11/2022    2:41 PM 10/14/2022    2:02 PM 10/14/2022    1:01 PM  CMP  Glucose 70 - 99 mg/dL 97   188   BUN 8 - 23 mg/dL 35    31   Creatinine 4.16 - 1.24 mg/dL 6.06  3.01  6.01   Sodium 135 - 145 mmol/L 137   136   Potassium 3.5 - 5.1 mmol/L 4.4   4.3   Chloride 98 - 111 mmol/L 102   103   CO2 22 - 32 mmol/L 25   26   Calcium 8.9 - 10.3 mg/dL 8.8   8.8   Total Protein 6.5 - 8.1 g/dL 6.6   6.2   Total Bilirubin 0.3 - 1.2 mg/dL 1.2   1.1   Alkaline Phos 38 - 126 U/L 59   55   AST 15 - 41 U/L 42   30   ALT 0 - 44 U/L 40   29      No results found for: "CEA1", "CEA" / No results found for: "CEA1", "CEA" No results found for: "PSA1" No results found for: "UXN235" No results found for: "CAN125"  No results found for: "TOTALPROTELP", "ALBUMINELP", "A1GS", "A2GS", "BETS", "BETA2SER", "GAMS", "MSPIKE", "SPEI" Lab Results  Component Value Date   TIBC 364 04/11/2022   FERRITIN 62 04/11/2022   IRONPCTSAT 30 04/11/2022   No results found for: "LDH"   STUDIES:   NM PET Image Restag (PS) Skull Base To Thigh  Result Date: 12/24/2022 CLINICAL DATA:  Subsequent treatment strategy for left tonsillar carcinoma. EXAM: NUCLEAR MEDICINE PET SKULL BASE TO THIGH TECHNIQUE: 7.3 mCi F-18 FDG was injected intravenously. Full-ring PET imaging was performed from the skull base to thigh after the radiotracer. CT data was obtained and used for attenuation correction and anatomic localization. Fasting blood glucose: 90 mg/dl COMPARISON:  CT on 57/32/2025 and PET-CT on 08/02/2021 FINDINGS: Mediastinal blood-pool activity (background): SUV max = 2.3 Liver activity (reference): SUV max = N/A NECK:  No hypermetabolic masses or lymph nodes. Incidental CT findings:  None. CHEST: No hypermetabolic lymph nodes. No suspicious pulmonary nodules seen on CT images. Incidental CT findings:  None. ABDOMEN/PELVIS: No abnormal hypermetabolic activity within the liver, pancreas, adrenal glands, or spleen. No hypermetabolic lymph nodes in the abdomen or pelvis. Incidental CT findings:  None. SKELETON: No focal hypermetabolic bone lesions to suggest  skeletal metastasis. Incidental CT findings:  None. IMPRESSION: Negative. No evidence of recurrent or metastatic carcinoma. Electronically Signed   By: Danae Orleans M.D.   On: 12/24/2022 17:15

## 2022-12-25 ENCOUNTER — Inpatient Hospital Stay: Payer: Medicare HMO | Admitting: Hematology

## 2022-12-25 VITALS — BP 142/71 | HR 57 | Temp 97.3°F | Resp 16 | Ht 67.0 in | Wt 146.5 lb

## 2022-12-25 DIAGNOSIS — C099 Malignant neoplasm of tonsil, unspecified: Secondary | ICD-10-CM

## 2022-12-25 DIAGNOSIS — Z85818 Personal history of malignant neoplasm of other sites of lip, oral cavity, and pharynx: Secondary | ICD-10-CM | POA: Diagnosis not present

## 2022-12-25 NOTE — Patient Instructions (Signed)
[facility] at [facility] Discharge Instructions   You were seen and examined today by Dr. Ellin Saba.  He reviewed the results of your PET scan which is normal.  We will see you back in 6 months. We will repeat lab work and a CT scan of the neck prior to this visit.   Return as scheduled.    Thank you for choosing [facility] at [facility] to provide your oncology and hematology care.  To afford each patient quality time with our provider, please arrive at least 15 minutes before your scheduled appointment time.   If you have a lab appointment with the Cancer Center please come in thru the Main Entrance and check in at the main information desk.  You need to re-schedule your appointment should you arrive 10 or more minutes late.  We strive to give you quality time with our providers, and arriving late affects you and other patients whose appointments are after yours.  Also, if you no show three or more times for appointments you may be dismissed from the clinic at the providers discretion.     Again, thank you for choosing [facility].  Our hope is that these requests will decrease the amount of time that you wait before being seen by our physicians.       _____________________________________________________________  Should you have questions after your visit to [facility], please contact our office at [phone] and follow the prompts.  Our office hours are 8:00 a.m. and 4:30 p.m. Monday - Friday.  Please note that voicemails left after 4:00 p.m. may not be returned until the following business day.  We are closed weekends and major holidays.  You do have access to a nurse 24-7, just call the main number to the clinic [phone] and do not press any options, hold on the line and a nurse will answer the phone.    For prescription refill requests, have your pharmacy contact our office and allow 72 hours.    Due  to Covid, you will need to wear a mask upon entering the hospital. If you do not have a mask, a mask will be given to you at the Main Entrance upon arrival. For doctor visits, patients may have 1 support person age 1 or older with them. For treatment visits, patients can not have anyone with them due to social distancing guidelines and our immunocompromised population.

## 2023-02-26 ENCOUNTER — Other Ambulatory Visit: Payer: Self-pay | Admitting: Hematology

## 2023-04-23 ENCOUNTER — Ambulatory Visit (HOSPITAL_COMMUNITY): Payer: Medicare HMO

## 2023-04-23 ENCOUNTER — Other Ambulatory Visit: Payer: Medicare HMO

## 2023-05-05 ENCOUNTER — Ambulatory Visit: Payer: Medicare HMO | Admitting: Hematology

## 2023-06-27 ENCOUNTER — Ambulatory Visit (HOSPITAL_COMMUNITY)
Admission: RE | Admit: 2023-06-27 | Discharge: 2023-06-27 | Disposition: A | Discharge status: Home / Self Care | Payer: Medicare HMO | Source: Ambulatory Visit | Attending: Hematology | Admitting: Hematology

## 2023-06-27 ENCOUNTER — Inpatient Hospital Stay: Payer: Medicare HMO | Attending: Hematology

## 2023-06-27 DIAGNOSIS — Z79899 Other long term (current) drug therapy: Secondary | ICD-10-CM | POA: Diagnosis not present

## 2023-06-27 DIAGNOSIS — Z923 Personal history of irradiation: Secondary | ICD-10-CM | POA: Insufficient documentation

## 2023-06-27 DIAGNOSIS — Z9221 Personal history of antineoplastic chemotherapy: Secondary | ICD-10-CM | POA: Insufficient documentation

## 2023-06-27 DIAGNOSIS — C099 Malignant neoplasm of tonsil, unspecified: Secondary | ICD-10-CM | POA: Insufficient documentation

## 2023-06-27 DIAGNOSIS — G629 Polyneuropathy, unspecified: Secondary | ICD-10-CM | POA: Insufficient documentation

## 2023-06-27 DIAGNOSIS — Z87891 Personal history of nicotine dependence: Secondary | ICD-10-CM | POA: Insufficient documentation

## 2023-06-27 DIAGNOSIS — Z85818 Personal history of malignant neoplasm of other sites of lip, oral cavity, and pharynx: Secondary | ICD-10-CM | POA: Insufficient documentation

## 2023-06-27 LAB — CBC WITH DIFFERENTIAL/PLATELET
Abs Immature Granulocytes: 0.03 10*3/uL (ref 0.00–0.07)
Basophils Absolute: 0.1 10*3/uL (ref 0.0–0.1)
Basophils Relative: 1 %
Eosinophils Absolute: 0.6 10*3/uL — ABNORMAL HIGH (ref 0.0–0.5)
Eosinophils Relative: 7 %
HCT: 42.1 % (ref 39.0–52.0)
Hemoglobin: 14.2 g/dL (ref 13.0–17.0)
Immature Granulocytes: 0 %
Lymphocytes Relative: 17 %
Lymphs Abs: 1.4 10*3/uL (ref 0.7–4.0)
MCH: 32.9 pg (ref 26.0–34.0)
MCHC: 33.7 g/dL (ref 30.0–36.0)
MCV: 97.7 fL (ref 80.0–100.0)
Monocytes Absolute: 0.9 10*3/uL (ref 0.1–1.0)
Monocytes Relative: 12 %
Neutro Abs: 5 10*3/uL (ref 1.7–7.7)
Neutrophils Relative %: 63 %
Platelets: 216 10*3/uL (ref 150–400)
RBC: 4.31 MIL/uL (ref 4.22–5.81)
RDW: 13.6 % (ref 11.5–15.5)
WBC: 8 10*3/uL (ref 4.0–10.5)
nRBC: 0 % (ref 0.0–0.2)

## 2023-06-27 LAB — COMPREHENSIVE METABOLIC PANEL
ALT: 23 U/L (ref 0–44)
AST: 29 U/L (ref 15–41)
Albumin: 3.8 g/dL (ref 3.5–5.0)
Alkaline Phosphatase: 67 U/L (ref 38–126)
Anion gap: 8 (ref 5–15)
BUN: 35 mg/dL — ABNORMAL HIGH (ref 8–23)
CO2: 29 mmol/L (ref 22–32)
Calcium: 9.3 mg/dL (ref 8.9–10.3)
Chloride: 103 mmol/L (ref 98–111)
Creatinine, Ser: 1.45 mg/dL — ABNORMAL HIGH (ref 0.61–1.24)
GFR, Estimated: 50 mL/min — ABNORMAL LOW (ref >60)
Glucose, Bld: 98 mg/dL (ref 70–99)
Potassium: 4.8 mmol/L (ref 3.5–5.1)
Sodium: 140 mmol/L (ref 135–145)
Total Bilirubin: 1 mg/dL (ref 0.0–1.2)
Total Protein: 6.8 g/dL (ref 6.5–8.1)

## 2023-06-27 LAB — TSH: TSH: 4.781 u[IU]/mL — ABNORMAL HIGH (ref 0.350–4.500)

## 2023-06-27 MED ORDER — IOHEXOL 300 MG/ML  SOLN
75.0000 mL | Freq: Once | INTRAMUSCULAR | Status: AC | PRN
Start: 2023-06-27 — End: 2023-06-27
  Administered 2023-06-27: 75 mL via INTRAVENOUS

## 2023-07-02 NOTE — Progress Notes (Signed)
 Douglas [name] [address], [place] [address]    Clinic Day:  07/03/2023  Referring physician: Shelby Dubin, FNP  Patient Care Team: Douglas Dubin, FNP as PCP - General (Family Medicine) Douglas Sarah, RN as Oncology Nurse Navigator (Oncology) Douglas Massed, MD as Medical Oncologist (Medical Oncology)   ASSESSMENT & PLAN:   Assessment: 1.  Left tonsillar squamous cell cancer: - Left tonsillar biopsy by Dr. Suszanne [name] on 11/15/2020 consistent with squamous cell carcinoma.  P16 is strongly positive. - PET scan on 12/07/2020-shows 4.1 cm left palatine tonsil mass with SUV 16.  0.8 cm left level 3 lymph node SUV 4.4.  Elongated 0.9 x 1 cm right lower lobe nodule along the azygoesophageal recess has accentuated metabolic activity with SUV 5.8.  This has some solid component and was not visible on chest CT of 04/24/2020.  Differential includes unusual morphology of metastatic disease versus inflammatory process.  Groundglass opacity laterally in the right upper lobe likely inflammatory with SUV 1.4.  0.6 cm right middle lobe nodule not hypermetabolic. - Concurrent chemoradiation therapy from 01/09/2021 through 02/26/2021 - Pretreatment audiogram from Dr. Avel Sensor office showed bilateral sensorineural hearing loss.  Hearing on the left side is slightly worse due to tumor.  2.  Social/family history: - He has a retired after working for [employer].  He quit smoking 12 years ago.  He smoked 2 packs/day for 20 years. - Sister had lung cancer.  2 sisters died of lung cancer.  Brother had lung cancer.    Plan: 1.  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  He has dysphagia to certain foods which is stable. - Reviewed labs from 06/27/2023: Normal LFTs.  Elevated creatinine 1.4 stable.  CBC normal.  TSH mildly elevated at 4.7, change from 3.8 last visit. - CT soft tissue neck (06/26/2022): Ill-defined enhancing tissue  involving the left posterior oral tongue/left glossotonsillar sulcus similar to last scans but slightly increased in conspicuity compared to May 2023.  Asymmetric appearance of the right pyriform sinus with possible enhancing soft tissue within.  No cervical adenopathy. - Oropharyngeal exam: No suspicious masses. - He has a follow-up with Dr.Teoh in the second week of March for direct visualization. - RTC 6 months for follow-up with repeat labs and scan.  2.  Peripheral neuropathy: - He will continue gabapentin at bedtime.    Orders Placed This Encounter  Procedures   CT SOFT TISSUE NECK W CONTRAST    Standing Status:   Future    Expected Date:   12/31/2023    Expiration Date:   07/02/2024    If indicated for the ordered procedure, I authorize the administration of contrast media per Radiology protocol:   Yes    Does the patient have a contrast media/X-ray dye allergy?:   No    Preferred imaging location?:   [facility] R Teague,acting as a scribe for Douglas Massed, MD.,have documented all relevant documentation on the behalf of Douglas Massed, MD,as directed by  Douglas Massed, MD while in the presence of Douglas Massed, MD.  I, Douglas Massed MD, have reviewed the above documentation for accuracy and completeness, and I agree with the above.     Douglas Massed, MD   2/27/202512:28 PM  CHIEF COMPLAINT:   Diagnosis: left tonsil cancer    Cancer Staging  Primary tonsillar squamous cell carcinoma (HCC) Staging form: Pharynx -  HPV-Mediated Oropharynx, AJCC 8th Edition - Clinical stage from 11/29/2020: cT4, p16+ - Signed by Douglas Delay, MD on 11/29/2020    Prior Therapy: concurrent chemoradiation with weekly cisplatin, 01/09/21 - 02/26/21  Current Therapy:  surveillance   HISTORY OF PRESENT ILLNESS:   Oncology History  Primary tonsillar squamous cell carcinoma (HCC)  09/04/2020 Initial Diagnosis   He has been complaining of  left sided ear pain, odynphagia and dysphagia   11/15/2020 Pathology Results   [id number] Tonsil biopsy: squamous cell carcinoma P16 strongly positive   11/15/2020 Procedure   He underwent tonsil biopsy   11/22/2020 Imaging   CT neck 1. 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 There is preferential submucosal growth and the mass is indistinguishable from the left prevertebral space and lower left stylopharyngeaus. No convincing adenopathy. 2. Mixed density right upper lobe nodule, attention on anticipated PET.   11/29/2020 Initial Diagnosis   Tonsil cancer (HCC)   11/29/2020 Cancer Staging   Staging form: Pharynx - HPV-Mediated Oropharynx, AJCC 8th Edition - Clinical stage from 11/29/2020: cT4, p16+ - Signed by Douglas Delay, MD on 11/29/2020 Stage prefix: Initial diagnosis   01/09/2021 - 02/20/2021 Chemotherapy   Patient is on Treatment Plan : HEAD/NECK Cisplatin q7d        INTERVAL HISTORY:   Douglas [name] is a 78 y.o. male presenting to clinic today for follow up of left tonsil cancer. He was last seen by me on 12/25/22.  Since his last visit, he underwent CT soft tissue neck on 06/27/23 that found: Ill-defined enhancing tissue involving the left posterior oral tongue/left glossotonsillar sulcus is similar to studies from 10/14/2022 and 04/11/2022 and slightly increased in conspicuity compared to 10/03/2021. Asymmetric appearance of the right piriform sinus with possible enhancing soft tissue within. No cervical lymphadenopathy.  Today, he states that he is doing well overall. His appetite level is at 100%. His energy level is at 100%. Stevie denies any dysphagia. He has a follow-up with Dr. Suszanne [name] on the second week of March 2025.  PAST MEDICAL HISTORY:   Past Medical History: Past Medical History:  Diagnosis Date   Arthritis    Cancer (HCC)    Skin, and Kidney   Hypertension    Pre-diabetes    Skin cancer     Surgical History: Past Surgical History:   Procedure Laterality Date   COLONOSCOPY     HERNIA REPAIR     IR GASTROSTOMY TUBE MOD SED  12/11/2020   IR GASTROSTOMY TUBE REMOVAL  04/25/2021   IR IMAGING GUIDED PORT INSERTION  12/11/2020   LAPAROSCOPIC PARTIAL NEPHRECTOMY Left    PARTIAL NEPHRECTOMY Left 2009   PORT-A-CATH REMOVAL Right 11/06/2022   Procedure: MINOR REMOVAL PORT-A-CATH;  Surgeon: Franky Macho, MD;  Location: [facility];  Service: General;  Laterality: Right;   REVERSE SHOULDER ARTHROPLASTY Right 09/10/2019   Procedure: REVERSE SHOULDER ARTHROPLASTY;  Surgeon: Beverely Low, MD;  Location: [facility];  Service: Orthopedics;  Laterality: Right;  interscalene block   SKIN SURGERY      Social History: Social History   Socioeconomic History   Marital status: Widowed    Spouse name: Not on file   Number of children: Not on file   Years of education: Not on file   Highest education level: Not on file  Occupational History    Employer: [employer]  Tobacco Use   Smoking status: Former    Current packs/day: 0.00    Average packs/day: 1 pack/day for 20.0 years (20.0  ttl pk-yrs)    Types: Cigarettes    Start date: 74    Quit date: 2010    Years since quitting: 15.1   Smokeless tobacco: Never  Vaping Use   Vaping status: Never Used  Substance and Sexual Activity   Alcohol use: Not Currently    Alcohol/week: 1.0 standard drink of alcohol    Types: 1 Cans of beer per week    Comment: occ   Drug use: Never   Sexual activity: Not Currently  Other Topics Concern   Not on file  Social History Narrative   Right and Left Handed.      Lives in a one story home    Drinks Caffeine    Social Drivers of Health   Financial Resource Strain: Low Risk  (11/29/2020)   Overall Financial Resource Strain (CARDIA)    Difficulty of Paying Living Expenses: Not hard at all  Food Insecurity: No Food Insecurity (11/29/2020)   Hunger Vital Sign    Worried About Running Out of Food in the Last Year: Never true    Ran Out of Food in the  Last Year: Never true  Transportation Needs: No Transportation Needs (11/29/2020)   PRAPARE - Administrator, Civil Service (Medical): No    Lack of Transportation (Non-Medical): No  Physical Activity: Sufficiently Active (11/29/2020)   Exercise Vital Sign    Days of Exercise per Week: 7 days    Minutes of Exercise per Session: 30 min  Stress: No Stress Concern Present (11/29/2020)   [employer] of Occupational Health - Occupational Stress Questionnaire    Feeling of Stress : Not at all  Social Connections: Socially Isolated (11/29/2020)   Social Connection and Isolation Panel [NHANES]    Frequency of Communication with Friends and Family: More than three times a week    Frequency of Social Gatherings with Friends and Family: More than three times a week    Attends Religious Services: Never    Database administrator or Organizations: No    Attends Banker Meetings: Never    Marital Status: Widowed  Intimate Partner Violence: Not At Risk (11/29/2020)   Humiliation, Afraid, Rape, and Kick questionnaire    Fear of Current or Ex-Partner: No    Emotionally Abused: No    Physically Abused: No    Sexually Abused: No    Family History: Family History  Problem Relation Age of Onset   Diabetes Mother    Pulmonary embolism Father    Lung cancer Sister    Lung cancer Sister    Diabetes Brother    Lung cancer Brother     Current Medications:  Current Outpatient Medications:    amLODipine (NORVASC) 10 MG tablet, Take 10 mg by mouth daily., Disp: , Rfl:    atenolol (TENORMIN) 25 MG tablet, Take 25 mg by mouth every evening., Disp: , Rfl:    cholecalciferol (VITAMIN D3) 25 MCG (1000 UNIT) tablet, Take 1,000 Units by mouth in the morning., Disp: , Rfl:    diclofenac (VOLTAREN) 75 MG EC tablet, Take 75 mg by mouth 2 (two) times daily as needed (pain.)., Disp: , Rfl:    fluticasone (FLONASE) 50 MCG/ACT nasal spray, Place 1 spray into both nostrils daily as needed  (allergies.)., Disp: , Rfl:    gabapentin (NEURONTIN) 300 MG capsule, TAKE ONE CAPSULE BY MOUTH AT BEDTIME, Disp: 30 capsule, Rfl: 6   lisinopril (ZESTRIL) 40 MG tablet, Take 40 mg by mouth every evening.,  Disp: , Rfl:    meclizine (ANTIVERT) 25 MG tablet, Take 25 mg by mouth 2 (two) times daily as needed (dizziness/vertigo)., Disp: , Rfl:    Menaquinone-7 (VITAMIN K2 PO), Take 1 tablet by mouth in the morning., Disp: , Rfl:    moxifloxacin (VIGAMOX) 0.5 % ophthalmic solution, Place 1 drop into the left eye in the morning., Disp: , Rfl:    Multiple Vitamins-Minerals (MULTIVITAMIN WITH MINERALS) tablet, Take 1 tablet by mouth daily., Disp: , Rfl:    prednisoLONE acetate (PRED FORTE) 1 % ophthalmic suspension, Place 1 drop into the left eye in the morning., Disp: , Rfl:    simvastatin (ZOCOR) 40 MG tablet, Take 40 mg by mouth every evening., Disp: , Rfl:    tamsulosin (FLOMAX) 0.4 MG CAPS capsule, Take 0.8 mg by mouth every evening., Disp: , Rfl:    zinc gluconate 50 MG tablet, Take 50 mg by mouth in the morning., Disp: , Rfl:    Allergies: No Known Allergies  REVIEW OF SYSTEMS:   Review of Systems  Constitutional:  Negative for chills, fatigue and fever.  HENT:   Negative for lump/mass, mouth sores, nosebleeds, sore throat and trouble swallowing.   Eyes:  Negative for eye problems.  Respiratory:  Negative for cough and shortness of breath.   Cardiovascular:  Negative for chest pain, leg swelling and palpitations.  Gastrointestinal:  Negative for abdominal pain, constipation, diarrhea, nausea and vomiting.  Genitourinary:  Negative for bladder incontinence, difficulty urinating, dysuria, frequency, hematuria and nocturia.   Musculoskeletal:  Negative for arthralgias, back pain, flank pain, myalgias and neck pain.       +left shoulder pain, 6/10 severity  Skin:  Negative for itching and rash.  Neurological:  Positive for numbness (and tingling feet). Negative for dizziness and headaches.   Hematological:  Does not bruise/bleed easily.  Psychiatric/Behavioral:  Negative for depression, sleep disturbance and suicidal ideas. The patient is not nervous/anxious.   All other systems reviewed and are negative.    VITALS:   Blood pressure (!) 133/58, pulse (!) 55, temperature (!) 96.9 F (36.1 C), temperature source Tympanic, resp. rate 18, weight 153 lb 3.5 oz (69.5 kg), SpO2 100%.  Wt Readings from Last 3 Encounters:  07/03/23 153 lb 3.5 oz (69.5 kg)  12/25/22 146 lb 8 oz (66.5 kg)  11/05/22 145 lb (65.8 kg)    Body mass index is 24 kg/m.  Performance status (ECOG): 1 - Symptomatic but completely ambulatory  PHYSICAL EXAM:   Physical Exam Vitals and nursing note reviewed. Exam conducted with a chaperone present.  Constitutional:      Appearance: Normal appearance.  Cardiovascular:     Rate and Rhythm: Normal rate and regular rhythm.     Pulses: Normal pulses.     Heart sounds: Normal heart sounds.  Pulmonary:     Effort: Pulmonary effort is normal.     Breath sounds: Normal breath sounds.  Abdominal:     Palpations: Abdomen is soft. There is no hepatomegaly, splenomegaly or mass.     Tenderness: There is no abdominal tenderness.  Musculoskeletal:     Right lower leg: No edema.     Left lower leg: No edema.  Lymphadenopathy:     Cervical: No cervical adenopathy.     Right cervical: No superficial, deep or posterior cervical adenopathy.    Left cervical: No superficial, deep or posterior cervical adenopathy.     Upper Body:     Right upper body: No supraclavicular or axillary  adenopathy.     Left upper body: No supraclavicular or axillary adenopathy.  Neurological:     General: No focal deficit present.     Mental Status: He is alert and oriented to person, place, and time.  Psychiatric:        Mood and Affect: Mood normal.        Behavior: Behavior normal.     LABS:      Latest Ref Rng & Units 06/27/2023    9:12 AM 12/11/2022    2:41 PM 10/14/2022     1:01 PM  CBC  WBC 4.0 - 10.5 K/uL 8.0  7.2  10.2   Hemoglobin 13.0 - 17.0 g/dL 16.1  09.6  04.5   Hematocrit 39.0 - 52.0 % 42.1  41.2  40.9   Platelets 150 - 400 K/uL 216  208  193       Latest Ref Rng & Units 06/27/2023    9:12 AM 12/11/2022    2:41 PM 10/14/2022    2:02 PM  CMP  Glucose 70 - 99 mg/dL 98  97    BUN 8 - 23 mg/dL 35  35    Creatinine 4.09 - 1.24 mg/dL 8.11  9.14  7.82   Sodium 135 - 145 mmol/L 140  137    Potassium 3.5 - 5.1 mmol/L 4.8  4.4    Chloride 98 - 111 mmol/L 103  102    CO2 22 - 32 mmol/L 29  25    Calcium 8.9 - 10.3 mg/dL 9.3  8.8    Total Protein 6.5 - 8.1 g/dL 6.8  6.6    Total Bilirubin 0.0 - 1.2 mg/dL 1.0  1.2    Alkaline Phos 38 - 126 U/L 67  59    AST 15 - 41 U/L 29  42    ALT 0 - 44 U/L 23  40       No results found for: "CEA1", "CEA" / No results found for: "CEA1", "CEA" No results found for: "PSA1" No results found for: "NFA213" No results found for: "CAN125"  No results found for: "TOTALPROTELP", "ALBUMINELP", "A1GS", "A2GS", "BETS", "BETA2SER", "GAMS", "MSPIKE", "SPEI" Lab Results  Component Value Date   TIBC 364 04/11/2022   FERRITIN 62 04/11/2022   IRONPCTSAT 30 04/11/2022   No results found for: "LDH"   STUDIES:   CT SOFT TISSUE NECK W CONTRAST Result Date: 07/02/2023 CLINICAL DATA:  Follow-up of left tonsil cancer, treatment completed in 2022. EXAM: CT NECK WITH CONTRAST TECHNIQUE: Multidetector CT imaging of the neck was performed using the standard protocol following the bolus administration of intravenous contrast. RADIATION DOSE REDUCTION: This exam was performed according to the departmental dose-optimization program which includes automated exposure control, adjustment of the mA and/or kV according to patient size and/or use of iterative reconstruction technique. CONTRAST:  75mL OMNIPAQUE IOHEXOL 300 MG/ML  SOLN COMPARISON:  CT neck 10/14/2022 FINDINGS: Suprahyoid neck: Nasopharynx is symmetric. Redemonstrated ill-defined  hyperenhancing focus involving the left posterior aspect of the oral tongue extending to the left glossotonsillar sulcus which is similar a in appearance to prior and measures 3.0 x 2.0 cm on the current study. The oral cavity and oropharynx are otherwise unremarkable. There is mild thickening of the epiglottis which is unchanged from multiple prior studies and may reflect post treatment changes. Retropharynx is unremarkable. Infrahyoid neck: Similar appearance of the aryepiglottic folds. There is asymmetry of the right piriform sinus with possible enhancing soft tissue within. No erosion or abnormality of  the adjacent thyroid cartilage. There is normal appearance of the paraglottic fat. Vocal folds are symmetric. Salivary glands: The parotid and submandibular glands are symmetric. No inflammation, mass, or calcification. Lymph nodes: No cervical or supraclavicular lymphadenopathy. Thyroid: Normal. Vascular: Mild atherosclerosis of the visualized aortic arch. Atherosclerotic calcifications at the carotid bifurcations more pronounced on the right. No findings to suggest high-grade stenosis. Partial retropharyngeal course of the internal carotid arteries more so on the left. Limited intracranial: Limited visualization of intracranial structures without focal abnormality. Visualized orbits: Limited visualization of the orbits which is unremarkable. Mastoids and visualized paranasal sinuses: Visualized paranasal sinuses are clear. Mastoid air cells are clear. Skeleton: No acute or aggressive lesion. Partially visualized right shoulder reverse arthroplasty. Plate and screw hardware along the superior aspect of the right scapula. Advanced degenerative changes in the cervical spine again noted. Upper chest: Visualized lung apices are clear. IMPRESSION: 1. Ill-defined enhancing tissue involving the left posterior oral tongue/left glossotonsillar sulcus is similar to studies from 10/14/2022 and 04/11/2022 and slightly  increased in conspicuity compared to 10/03/2021. 2. Asymmetric appearance of the right piriform sinus with possible enhancing soft tissue within. Recommend direct visualization. 3. No cervical lymphadenopathy. Electronically Signed   By: Douglas [name] M.D.   On: 07/02/2023 09:56

## 2023-07-03 ENCOUNTER — Inpatient Hospital Stay (HOSPITAL_BASED_OUTPATIENT_CLINIC_OR_DEPARTMENT_OTHER): Payer: Medicare HMO | Admitting: Hematology

## 2023-07-03 VITALS — BP 133/58 | HR 55 | Temp 96.9°F | Resp 18 | Wt 153.2 lb

## 2023-07-03 DIAGNOSIS — C099 Malignant neoplasm of tonsil, unspecified: Secondary | ICD-10-CM

## 2023-07-03 NOTE — Patient Instructions (Addendum)
 [facility] at [facility] Discharge Instructions   You were seen and examined today by Dr. Ellin Saba.  He reviewed the results of your lab work which are normal/stable.   He reviewed the results of your CT scan. There is no evidence of cancer on this exam.   We will see you back in 6 months. We will repeat a CT scan and labs prior to this visit.    Return as scheduled.    Thank you for choosing [facility] at [facility] to provide your oncology and hematology care.  To afford each patient quality time with our provider, please arrive at least 15 minutes before your scheduled appointment time.   If you have a lab appointment with the Cancer Center please come in thru the Main Entrance and check in at the main information desk.  You need to re-schedule your appointment should you arrive 10 or more minutes late.  We strive to give you quality time with our providers, and arriving late affects you and other patients whose appointments are after yours.  Also, if you no show three or more times for appointments you may be dismissed from the clinic at the providers discretion.     Again, thank you for choosing [facility].  Our hope is that these requests will decrease the amount of time that you wait before being seen by our physicians.       _____________________________________________________________  Should you have questions after your visit to [facility], please contact our office at [phone] and follow the prompts.  Our office hours are 8:00 a.m. and 4:30 p.m. Monday - Friday.  Please note that voicemails left after 4:00 p.m. may not be returned until the following business day.  We are closed weekends and major holidays.  You do have access to a nurse 24-7, just call the main number to the clinic [phone] and do not press any options, hold on the line and a nurse will answer the phone.    For prescription  refill requests, have your pharmacy contact our office and allow 72 hours.    Due to Covid, you will need to wear a mask upon entering the hospital. If you do not have a mask, a mask will be given to you at the Main Entrance upon arrival. For doctor visits, patients may have 1 support person age 34 or older with them. For treatment visits, patients can not have anyone with them due to social distancing guidelines and our immunocompromised population.

## 2023-07-17 ENCOUNTER — Telehealth (INDEPENDENT_AMBULATORY_CARE_PROVIDER_SITE_OTHER): Payer: Self-pay | Admitting: Otolaryngology

## 2023-07-17 NOTE — Telephone Encounter (Signed)
 Confirmed appt and address with patient for 07/18/2023.

## 2023-07-18 ENCOUNTER — Ambulatory Visit (INDEPENDENT_AMBULATORY_CARE_PROVIDER_SITE_OTHER): Payer: Medicare HMO | Admitting: Otolaryngology

## 2023-07-18 ENCOUNTER — Encounter (INDEPENDENT_AMBULATORY_CARE_PROVIDER_SITE_OTHER): Payer: Self-pay

## 2023-07-18 VITALS — BP 144/71 | HR 55 | Ht 67.0 in | Wt 155.0 lb

## 2023-07-18 DIAGNOSIS — Z85819 Personal history of malignant neoplasm of unspecified site of lip, oral cavity, and pharynx: Secondary | ICD-10-CM | POA: Diagnosis not present

## 2023-07-18 DIAGNOSIS — Z08 Encounter for follow-up examination after completed treatment for malignant neoplasm: Secondary | ICD-10-CM | POA: Diagnosis not present

## 2023-07-18 DIAGNOSIS — H903 Sensorineural hearing loss, bilateral: Secondary | ICD-10-CM | POA: Diagnosis not present

## 2023-07-20 DIAGNOSIS — H903 Sensorineural hearing loss, bilateral: Secondary | ICD-10-CM | POA: Insufficient documentation

## 2023-07-20 DIAGNOSIS — Z85819 Personal history of malignant neoplasm of unspecified site of lip, oral cavity, and pharynx: Secondary | ICD-10-CM | POA: Insufficient documentation

## 2023-07-20 NOTE — Progress Notes (Signed)
 Patient ID: Douglas [name], male   DOB: 10-16-1945, 78 y.o.   MRN: [id number]  Follow-up: Left tonsillar squamous cell carcinoma, hearing loss  HPI: The patient is a 78 year old male who returns today for his follow-up evaluation.  He has a history of left tonsillar squamous cell carcinoma.  He completed his chemoradiation treatment in October 2022.  His pretreatment hearing test showed bilateral high-frequency sensorineural hearing loss, slightly worse on the left side.  He reports no recent change in his hearing.  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  He denies any otalgia, otorrhea, or vertigo.  No other ENT, GI, or respiratory issue noted since the last visit.   Exam:  General: Communicates without difficulty, well nourished, no acute distress. Head: Normocephalic, no evidence injury, no tenderness, facial buttresses intact without stepoff. Face/sinus: No tenderness to palpation and percussion. Facial movement is normal and symmetric. Eyes: PERRL, EOMI. No scleral icterus, conjunctivae clear. Neuro: CN II exam reveals vision grossly intact. No nystagmus at any point of gaze. Ears: Auricles well formed without lesions. Ear canals are intact without mass or lesion. No erythema or edema is appreciated. The TMs are intact without fluid. Nose: External evaluation reveals normal support and skin without lesions. Dorsum is intact. Anterior rhinoscopy reveals pink mucosa over anterior aspect of inferior turbinates and intact septum. No purulence noted. Oral:  No mucosal abnormality is noted today.  Neck: Full range of motion without pain. There is no significant lymphadenopathy. No masses palpable. Thyroid bed within normal limits to palpation. Parotid glands and submandibular glands equal bilaterally without mass. Trachea is midline. Neuro:  CN 2-12 grossly intact.   Assessment: 1.  The patient is doing well status post chemoradiation treatment of his left tonsillar squamous cell carcinoma. No recurrent  disease is noted today. 2.  Subjectively stable bilateral high-frequency sensorineural hearing loss.  Plan:  1.  The physical exam and laryngoscopy findings are reviewed with the patient. 2.  The patient is a candidate for hearing amplification.  3.  The patient will return for reevaluation in 6 months.

## 2023-11-24 ENCOUNTER — Other Ambulatory Visit: Payer: Self-pay | Admitting: Hematology

## 2023-12-30 ENCOUNTER — Other Ambulatory Visit: Payer: Self-pay

## 2023-12-30 DIAGNOSIS — C099 Malignant neoplasm of tonsil, unspecified: Secondary | ICD-10-CM

## 2023-12-31 ENCOUNTER — Ambulatory Visit (HOSPITAL_BASED_OUTPATIENT_CLINIC_OR_DEPARTMENT_OTHER)
Admission: RE | Admit: 2023-12-31 | Discharge: 2023-12-31 | Disposition: A | Discharge status: Home / Self Care | Payer: Medicare HMO | Source: Ambulatory Visit | Attending: Hematology | Admitting: Hematology

## 2023-12-31 ENCOUNTER — Inpatient Hospital Stay: Payer: Medicare HMO | Attending: Oncology

## 2023-12-31 DIAGNOSIS — Z923 Personal history of irradiation: Secondary | ICD-10-CM | POA: Diagnosis not present

## 2023-12-31 DIAGNOSIS — C099 Malignant neoplasm of tonsil, unspecified: Secondary | ICD-10-CM | POA: Insufficient documentation

## 2023-12-31 DIAGNOSIS — Z9221 Personal history of antineoplastic chemotherapy: Secondary | ICD-10-CM | POA: Insufficient documentation

## 2023-12-31 DIAGNOSIS — Z79899 Other long term (current) drug therapy: Secondary | ICD-10-CM | POA: Diagnosis not present

## 2023-12-31 DIAGNOSIS — Z85818 Personal history of malignant neoplasm of other sites of lip, oral cavity, and pharynx: Secondary | ICD-10-CM | POA: Diagnosis present

## 2023-12-31 LAB — CBC WITH DIFFERENTIAL/PLATELET
Abs Immature Granulocytes: 0.02 K/uL (ref 0.00–0.07)
Basophils Absolute: 0.1 K/uL (ref 0.0–0.1)
Basophils Relative: 1 %
Eosinophils Absolute: 0.7 K/uL — ABNORMAL HIGH (ref 0.0–0.5)
Eosinophils Relative: 9 %
HCT: 42.3 % (ref 39.0–52.0)
Hemoglobin: 14.3 g/dL (ref 13.0–17.0)
Immature Granulocytes: 0 %
Lymphocytes Relative: 19 %
Lymphs Abs: 1.5 K/uL (ref 0.7–4.0)
MCH: 33.3 pg (ref 26.0–34.0)
MCHC: 33.8 g/dL (ref 30.0–36.0)
MCV: 98.4 fL (ref 80.0–100.0)
Monocytes Absolute: 0.9 K/uL (ref 0.1–1.0)
Monocytes Relative: 11 %
Neutro Abs: 4.8 K/uL (ref 1.7–7.7)
Neutrophils Relative %: 60 %
Platelets: 193 K/uL (ref 150–400)
RBC: 4.3 MIL/uL (ref 4.22–5.81)
RDW: 13.3 % (ref 11.5–15.5)
WBC: 8 K/uL (ref 4.0–10.5)
nRBC: 0 % (ref 0.0–0.2)

## 2023-12-31 LAB — COMPREHENSIVE METABOLIC PANEL WITH GFR
ALT: 22 U/L (ref 0–44)
AST: 26 U/L (ref 15–41)
Albumin: 3.9 g/dL (ref 3.5–5.0)
Alkaline Phosphatase: 72 U/L (ref 38–126)
Anion gap: 8 (ref 5–15)
BUN: 20 mg/dL (ref 8–23)
CO2: 29 mmol/L (ref 22–32)
Calcium: 9.4 mg/dL (ref 8.9–10.3)
Chloride: 103 mmol/L (ref 98–111)
Creatinine, Ser: 1.28 mg/dL — ABNORMAL HIGH (ref 0.61–1.24)
GFR, Estimated: 58 mL/min — ABNORMAL LOW (ref >60)
Glucose, Bld: 119 mg/dL — ABNORMAL HIGH (ref 70–99)
Potassium: 4.7 mmol/L (ref 3.5–5.1)
Sodium: 140 mmol/L (ref 135–145)
Total Bilirubin: 1.4 mg/dL — ABNORMAL HIGH (ref 0.0–1.2)
Total Protein: 6.8 g/dL (ref 6.5–8.1)

## 2023-12-31 LAB — TSH: TSH: 4.124 u[IU]/mL (ref 0.350–4.500)

## 2023-12-31 MED ORDER — IOHEXOL 300 MG/ML  SOLN
75.0000 mL | Freq: Once | INTRAMUSCULAR | Status: AC | PRN
  Administered 2023-12-31: 75 mL via INTRAVENOUS

## 2024-01-07 ENCOUNTER — Inpatient Hospital Stay: Payer: Medicare HMO | Attending: Hematology | Admitting: Oncology

## 2024-01-07 VITALS — BP 135/73 | HR 58 | Temp 96.7°F | Resp 18 | Wt 150.1 lb

## 2024-01-07 DIAGNOSIS — Z85818 Personal history of malignant neoplasm of other sites of lip, oral cavity, and pharynx: Secondary | ICD-10-CM | POA: Insufficient documentation

## 2024-01-07 DIAGNOSIS — C099 Malignant neoplasm of tonsil, unspecified: Secondary | ICD-10-CM

## 2024-01-07 DIAGNOSIS — Z79899 Other long term (current) drug therapy: Secondary | ICD-10-CM | POA: Diagnosis not present

## 2024-01-07 DIAGNOSIS — Z923 Personal history of irradiation: Secondary | ICD-10-CM | POA: Diagnosis not present

## 2024-01-07 DIAGNOSIS — G629 Polyneuropathy, unspecified: Secondary | ICD-10-CM | POA: Insufficient documentation

## 2024-01-07 DIAGNOSIS — Z9221 Personal history of antineoplastic chemotherapy: Secondary | ICD-10-CM | POA: Insufficient documentation

## 2024-01-07 DIAGNOSIS — Z801 Family history of malignant neoplasm of trachea, bronchus and lung: Secondary | ICD-10-CM | POA: Diagnosis not present

## 2024-01-07 NOTE — Progress Notes (Signed)
 [facility] OFFICE PROGRESS NOTE  [name], [name], FNP  ASSESSMENT & PLAN:    Assessment & Plan Primary tonsillar squamous cell carcinoma (HCC) - He denies any worsening dysphagia since last visit.  He has dysphagia to certain foods which is stable. - Reviewed labs from 12/31/2023: Normal LFTs.  Elevated creatinine 1.25 stable.  CBC normal.  TSH has returned to baseline 4.12. - CT soft tissue neck (12/30/24): No cervical adenopathy.  No recurrent tumor or adenopathy. - Oropharyngeal exam: No suspicious masses. - He has a follow-up with Dr.Teoh every 6 months and next visit is at the end of the month. - RTC 6 months for follow-up with repeat labs and scan.  Neuropathy - He will continue gabapentin  at bedtime.   Orders Placed This Encounter  Procedures   CT SOFT TISSUE NECK W CONTRAST    Standing Status:   Future    Expected Date:   07/06/2024    Expiration Date:   01/06/2025    If indicated for the ordered procedure, I authorize the administration of contrast media per Radiology protocol:   Yes    Does the patient have a contrast media/X-ray dye allergy?:   No    Preferred imaging location?:   [facility]   CBC with Differential    Standing Status:   Future    Expected Date:   07/06/2024    Expiration Date:   01/06/2025   Comprehensive metabolic panel    Standing Status:   Future    Expected Date:   07/06/2024    Expiration Date:   01/06/2025   TSH    Standing Status:   Future    Expected Date:   07/06/2024    Expiration Date:   01/06/2025    INTERVAL HISTORY: Patient returns for follow-up for tonsillar cancer.  He recently had CT scan of his neck and he is here to review the results.  Denies any lymphadenopathy or concerns for recurrence.  Reports chronic persistent dry mouth, dental issues and trouble swallowing certain foods but overall feels great.  Appetite and energy levels are 100%.  Denies any pain.  He has had both of his shoulders replaced and he will be  having left cataract surgery in the near future.  We reviewed CBC, CMP, TSH and CT scan.  SUMMARY OF HEMATOLOGIC HISTORY: Oncology History  Primary tonsillar squamous cell carcinoma (HCC)  09/04/2020 Initial Diagnosis   He has been complaining of left sided ear pain, odynphagia and dysphagia   11/15/2020 Pathology Results   [id number] Tonsil biopsy: squamous cell carcinoma P16 strongly positive   11/15/2020 Procedure   He underwent tonsil biopsy   11/22/2020 Imaging   CT neck 1. 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 There is preferential submucosal growth and the mass is indistinguishable from the left prevertebral space and lower left stylopharyngeaus. No convincing adenopathy. 2. Mixed density right upper lobe nodule, attention on anticipated PET.   11/29/2020 Initial Diagnosis   Tonsil cancer (HCC)   11/29/2020 Cancer Staging   Staging form: Pharynx - HPV-Mediated Oropharynx, AJCC 8th Edition - Clinical stage from 11/29/2020: cT4, p16+ - Signed by Lonn Hicks, MD on 11/29/2020 Stage prefix: Initial diagnosis   01/09/2021 - 02/20/2021 Chemotherapy   Patient is on Treatment Plan : HEAD/NECK Cisplatin  q7d       1.  Left tonsillar squamous cell cancer: - Left tonsillar biopsy by Dr. Karis on 11/15/2020 consistent with squamous cell carcinoma.  P16 is strongly positive. - PET scan on 12/07/2020-shows 4.1 cm left palatine tonsil mass with SUV 16.  0.8 cm left level 3 lymph node SUV 4.4.  Elongated 0.9 x 1 cm right lower lobe nodule along the azygoesophageal recess has accentuated metabolic activity with SUV 5.8.  This has some solid component and was not visible on chest CT of 04/24/2020.  Differential includes unusual morphology of metastatic disease versus inflammatory process.  Groundglass opacity laterally in the right upper lobe likely inflammatory with SUV 1.4.  0.6 cm right middle lobe nodule not hypermetabolic. - Concurrent chemoradiation therapy from  01/09/2021 through 02/26/2021 - Pretreatment audiogram from Dr. Rojean office showed bilateral sensorineural hearing loss.  Hearing on the left side is slightly worse due to tumor.  2.  Social/family history: - He has a retired after working for [employer].  He quit smoking 12 years ago.  He smoked 2 packs/day for 20 years. - Sister had lung cancer.  2 sisters died of lung cancer.  Brother had lung cancer.  CBC    Component Value Date/Time   WBC 8.0 12/31/2023 0801   RBC 4.30 12/31/2023 0801   HGB 14.3 12/31/2023 0801   HCT 42.3 12/31/2023 0801   PLT 193 12/31/2023 0801   MCV 98.4 12/31/2023 0801   MCH 33.3 12/31/2023 0801   MCHC 33.8 12/31/2023 0801   RDW 13.3 12/31/2023 0801   LYMPHSABS 1.5 12/31/2023 0801   MONOABS 0.9 12/31/2023 0801   EOSABS 0.7 (H) 12/31/2023 0801   BASOSABS 0.1 12/31/2023 0801       Latest Ref Rng & Units 12/31/2023    8:01 AM 06/27/2023    9:12 AM 12/11/2022    2:41 PM  CMP  Glucose 70 - 99 mg/dL 880  98  97   BUN 8 - 23 mg/dL 20  35  35   Creatinine 0.61 - 1.24 mg/dL 8.71  8.54  8.62   Sodium 135 - 145 mmol/L 140  140  137   Potassium 3.5 - 5.1 mmol/L 4.7  4.8  4.4   Chloride 98 - 111 mmol/L 103  103  102   CO2 22 - 32 mmol/L 29  29  25    Calcium 8.9 - 10.3 mg/dL 9.4  9.3  8.8   Total Protein 6.5 - 8.1 g/dL 6.8  6.8  6.6   Total Bilirubin 0.0 - 1.2 mg/dL 1.4  1.0  1.2   Alkaline Phos 38 - 126 U/L 72  67  59   AST 15 - 41 U/L 26  29  42   ALT 0 - 44 U/L 22  23  40      Lab Results  Component Value Date   FERRITIN 62 04/11/2022   VITAMINB12 714 04/11/2022    Vitals:   01/07/24 0930 01/07/24 0935  BP: (!) 141/70 135/73  Pulse: (!) 58   Resp: 18   Temp: (!) 96.7 F (35.9 C)   SpO2: 99%     Review of System:  Review of Systems  Musculoskeletal:  Positive for joint pain.  All other systems reviewed and are negative.   Physical Exam: Physical Exam Constitutional:      Appearance: Normal appearance.  HENT:     Head: Normocephalic and  atraumatic.  Eyes:     Pupils: Pupils are equal, round, and reactive to light.  Cardiovascular:     Rate and Rhythm: Normal rate and regular rhythm.     Heart sounds: Normal heart sounds. No  murmur heard. Pulmonary:     Effort: Pulmonary effort is normal.     Breath sounds: Normal breath sounds. No wheezing.  Abdominal:     General: Bowel sounds are normal. There is no distension.     Palpations: Abdomen is soft.     Tenderness: There is no abdominal tenderness.  Musculoskeletal:        General: Normal range of motion.     Cervical back: Normal range of motion.  Skin:    General: Skin is warm and dry.     Findings: No rash.  Neurological:     Mental Status: He is alert and oriented to person, place, and time.     Gait: Gait is intact.  Psychiatric:        Mood and Affect: Mood and affect normal.        Cognition and Memory: Memory normal.        Judgment: Judgment normal.      I spent 25 minutes dedicated to the care of this patient (face-to-face and non-face-to-face) on the date of the encounter to include what is described in the assessment and plan.,  Delon Hope, NP 01/07/2024 10:04 AM

## 2024-01-07 NOTE — Assessment & Plan Note (Addendum)
-   He will continue gabapentin  at bedtime.

## 2024-01-07 NOTE — Assessment & Plan Note (Addendum)
-   He denies any worsening dysphagia since last visit.  He has dysphagia to certain foods which is stable. - Reviewed labs from 12/31/2023: Normal LFTs.  Elevated creatinine 1.25 stable.  CBC normal.  TSH has returned to baseline 4.12. - CT soft tissue neck (12/30/24): No cervical adenopathy.  No recurrent tumor or adenopathy. - Oropharyngeal exam: No suspicious masses. - He has a follow-up with Dr.Teoh every 6 months and next visit is at the end of the month. - RTC 6 months for follow-up with repeat labs and scan.

## 2024-01-30 ENCOUNTER — Ambulatory Visit (INDEPENDENT_AMBULATORY_CARE_PROVIDER_SITE_OTHER): Admitting: Otolaryngology

## 2024-01-30 VITALS — BP 108/63 | HR 56

## 2024-01-30 DIAGNOSIS — Z85819 Personal history of malignant neoplasm of unspecified site of lip, oral cavity, and pharynx: Secondary | ICD-10-CM

## 2024-01-30 DIAGNOSIS — H903 Sensorineural hearing loss, bilateral: Secondary | ICD-10-CM

## 2024-01-30 NOTE — Progress Notes (Signed)
 Patient ID: Douglas [name], male   DOB: [birth year], 78 y.o.   MRN: [id number]  Follow-up: Left tonsillar squamous cell carcinoma, hearing loss  HPI: The patient is a 78 year old male who returns today for his follow-up evaluation.  He has a history of left tonsillar squamous cell carcinoma.  He completed his chemoradiation treatment in October 2022.  His pretreatment hearing test showed bilateral high-frequency sensorineural hearing loss, slightly worse on the left side.  He reports no recent change in his hearing.  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  He had an episode of sore throat 1 week ago.  He was treated with antibiotic, with resolution of the sore throat.  He denies any otalgia, otorrhea, or vertigo.  No other ENT, GI, or respiratory issue noted since the last visit.   Exam:  General: Communicates without difficulty, well nourished, no acute distress. Head: Normocephalic, no evidence injury, no tenderness, facial buttresses intact without stepoff. Face/sinus: No tenderness to palpation and percussion. Facial movement is normal and symmetric. Eyes: PERRL, EOMI. No scleral icterus, conjunctivae clear. Neuro: CN II exam reveals vision grossly intact. No nystagmus at any point of gaze. Ears: Auricles well formed without lesions. Ear canals are intact without mass or lesion. No erythema or edema is appreciated. The TMs are intact without fluid. Nose: External evaluation reveals normal support and skin without lesions. Dorsum is intact. Anterior rhinoscopy reveals pink mucosa over anterior aspect of inferior turbinates and intact septum. No purulence noted. Oral:  No mucosal abnormality is noted today.  Neck: Full range of motion without pain. There is no significant lymphadenopathy. No masses palpable. Thyroid  bed within normal limits to palpation. Parotid glands and submandibular glands equal bilaterally without mass. Trachea is midline. Neuro:  CN 2-12 grossly intact.   Assessment: 1.  The patient  is doing well status post chemoradiation treatment of his left tonsillar squamous cell carcinoma. No recurrent disease is noted today. 2.  Subjectively stable bilateral high-frequency sensorineural hearing loss.  Plan:  1.  The physical exam findings are reviewed with the patient. 2.  The patient is a candidate for hearing amplification.  3.  The patient will return for reevaluation in 6 months.

## 2024-07-07 ENCOUNTER — Ambulatory Visit (HOSPITAL_COMMUNITY)

## 2024-07-07 ENCOUNTER — Inpatient Hospital Stay

## 2024-07-14 ENCOUNTER — Ambulatory Visit: Admitting: Oncology

## 2024-07-14 ENCOUNTER — Inpatient Hospital Stay: Admitting: Oncology

## 2024-07-30 ENCOUNTER — Ambulatory Visit (INDEPENDENT_AMBULATORY_CARE_PROVIDER_SITE_OTHER): Admitting: Otolaryngology
